# Patient Record
Sex: Male | Born: 1960 | Hispanic: Yes | Marital: Married | Smoking: Former smoker
Health system: Southern US, Community
[De-identification: ages and names within clinical notes are randomized; demographics above are authoritative.]

## PROBLEM LIST (undated history)

## (undated) DIAGNOSIS — I4891 Unspecified atrial fibrillation: Secondary | ICD-10-CM

## (undated) DIAGNOSIS — R7303 Prediabetes: Secondary | ICD-10-CM

## (undated) DIAGNOSIS — M109 Gout, unspecified: Secondary | ICD-10-CM

## (undated) DIAGNOSIS — K219 Gastro-esophageal reflux disease without esophagitis: Secondary | ICD-10-CM

## (undated) DIAGNOSIS — G4730 Sleep apnea, unspecified: Secondary | ICD-10-CM

## (undated) DIAGNOSIS — H269 Unspecified cataract: Secondary | ICD-10-CM

## (undated) HISTORY — DX: Unspecified cataract: H26.9

## (undated) HISTORY — DX: Prediabetes: R73.03

## (undated) HISTORY — DX: Gout, unspecified: M10.9

## (undated) HISTORY — PX: WISDOM TOOTH EXTRACTION: SHX21

---

## 1998-04-21 ENCOUNTER — Emergency Department (HOSPITAL_COMMUNITY): Admission: EM | Admit: 1998-04-21 | Discharge: 1998-04-21 | Payer: Self-pay | Admitting: Emergency Medicine

## 1998-04-21 ENCOUNTER — Encounter: Payer: Self-pay | Admitting: Emergency Medicine

## 1998-04-22 ENCOUNTER — Encounter: Admission: RE | Admit: 1998-04-22 | Discharge: 1998-04-22 | Payer: Self-pay | Admitting: Internal Medicine

## 2000-08-08 ENCOUNTER — Encounter: Admission: RE | Admit: 2000-08-08 | Discharge: 2000-08-08 | Payer: Self-pay | Admitting: Family Medicine

## 2012-05-09 ENCOUNTER — Encounter (HOSPITAL_COMMUNITY): Payer: Self-pay | Admitting: *Deleted

## 2012-05-09 ENCOUNTER — Emergency Department (HOSPITAL_COMMUNITY): Payer: Self-pay

## 2012-05-09 ENCOUNTER — Emergency Department (HOSPITAL_COMMUNITY)
Admission: EM | Admit: 2012-05-09 | Discharge: 2012-05-09 | Disposition: A | Discharge status: Home / Self Care | Payer: Self-pay | Attending: Emergency Medicine | Admitting: Emergency Medicine

## 2012-05-09 DIAGNOSIS — W2203XA Walked into furniture, initial encounter: Secondary | ICD-10-CM | POA: Insufficient documentation

## 2012-05-09 DIAGNOSIS — Y929 Unspecified place or not applicable: Secondary | ICD-10-CM | POA: Insufficient documentation

## 2012-05-09 DIAGNOSIS — Y939 Activity, unspecified: Secondary | ICD-10-CM | POA: Insufficient documentation

## 2012-05-09 DIAGNOSIS — S8012XA Contusion of left lower leg, initial encounter: Secondary | ICD-10-CM

## 2012-05-09 DIAGNOSIS — S8010XA Contusion of unspecified lower leg, initial encounter: Secondary | ICD-10-CM | POA: Insufficient documentation

## 2012-05-09 DIAGNOSIS — Z87891 Personal history of nicotine dependence: Secondary | ICD-10-CM | POA: Insufficient documentation

## 2012-05-09 MED ORDER — HYDROCODONE-ACETAMINOPHEN 5-325 MG PO TABS: 1.0000 | ORAL_TABLET | Freq: Four times a day (QID) | ORAL | Status: DC | PRN

## 2012-05-09 MED ORDER — IBUPROFEN 800 MG PO TABS: 800.0000 mg | ORAL_TABLET | Freq: Three times a day (TID) | ORAL | Status: DC | PRN

## 2012-05-09 NOTE — ED Notes (Signed)
Pt to ED c/o L foot pain after tripping on chair and hitting his L thigh.  However, it is his L foot that is hurting, swelling and redness noted to anterior portion of R foot.

## 2012-05-13 NOTE — ED Provider Notes (Signed)
History     CSN: [id number]  Arrival date & time 05/09/12  1101   First MD Initiated Contact with Patient 05/09/12 1156      Chief Complaint  Patient presents with  . Foot Pain    (Consider location/radiation/quality/duration/timing/severity/associated sxs/prior treatment) HPI Patient presents to the ER with L ankle and lower leg pain that started 2 days ago after tripping over a chair. The patient denies numbness or weakness in the injury site. The patient states that palpation makes his pain worse. The patient did not take anything for his pain prior to arrival.  History reviewed. No pertinent past medical history.  History reviewed. No pertinent past surgical history.  No family history on file.  History  Substance Use Topics  . Smoking status: Former Games developer  . Smokeless tobacco: Not on file  . Alcohol Use: Yes     Comment: occasionally      Review of Systems All other systems negative except as documented in the HPI. All pertinent positives and negatives as reviewed in the HPI.  Allergies  Review of patient's allergies indicates no known allergies.  Home Medications   Current Outpatient Rx  Name  Route  Sig  Dispense  Refill  . ibuprofen (ADVIL,MOTRIN) 200 MG tablet   Oral   Take 800 mg by mouth every 6 (six) hours as needed for pain.         Marland Kitchen HYDROcodone-acetaminophen (NORCO/VICODIN) 5-325 MG per tablet   Oral   Take 1 tablet by mouth every 6 (six) hours as needed for pain.   15 tablet   0   . ibuprofen (ADVIL,MOTRIN) 800 MG tablet   Oral   Take 1 tablet (800 mg total) by mouth every 8 (eight) hours as needed for pain.   21 tablet   0     BP 124/67  Temp(Src) 97.9 F (36.6 C) (Oral)  Resp 18  SpO2 100%  Physical Exam  Nursing note and vitals reviewed. Constitutional: He is oriented to person, place, and time. He appears well-developed and well-nourished. No distress.  Musculoskeletal:       Left lower leg: He exhibits tenderness. He  exhibits no swelling, no edema and no deformity.       Legs: Neurological: He is alert and oriented to person, place, and time.  Skin: Skin is warm and dry. No rash noted.    ED Course  Procedures (including critical care time)  Labs Reviewed - No data to display No results found.   1. Contusion of lower leg, left, initial encounter     The patient has contusion to L lower leg. The patient is advised to use ice and elevation to the Lower leg. Return here as needed.     MDM          Carlyle Dolly, PA-C 05/13/12 1535

## 2012-05-14 NOTE — ED Provider Notes (Signed)
Medical screening examination/treatment/procedure(s) were performed by non-physician practitioner and as supervising physician I was immediately available for consultation/collaboration.  Coy Rochford R. Skylee Baird, MD 05/14/12 0726 

## 2015-10-20 ENCOUNTER — Ambulatory Visit (INDEPENDENT_AMBULATORY_CARE_PROVIDER_SITE_OTHER): Payer: BC Managed Care – PPO | Admitting: Physician Assistant

## 2015-10-20 ENCOUNTER — Other Ambulatory Visit: Payer: Self-pay | Admitting: Physician Assistant

## 2015-10-20 VITALS — BP 120/80 | HR 90 | Temp 98.2°F | Resp 18 | Ht 67.0 in | Wt 213.0 lb

## 2015-10-20 DIAGNOSIS — Z13228 Encounter for screening for other metabolic disorders: Secondary | ICD-10-CM | POA: Diagnosis not present

## 2015-10-20 DIAGNOSIS — Z113 Encounter for screening for infections with a predominantly sexual mode of transmission: Secondary | ICD-10-CM | POA: Diagnosis not present

## 2015-10-20 DIAGNOSIS — Z1211 Encounter for screening for malignant neoplasm of colon: Secondary | ICD-10-CM

## 2015-10-20 DIAGNOSIS — Z13220 Encounter for screening for lipoid disorders: Secondary | ICD-10-CM

## 2015-10-20 DIAGNOSIS — Z131 Encounter for screening for diabetes mellitus: Secondary | ICD-10-CM

## 2015-10-20 DIAGNOSIS — Z114 Encounter for screening for human immunodeficiency virus [HIV]: Secondary | ICD-10-CM

## 2015-10-20 DIAGNOSIS — Z1329 Encounter for screening for other suspected endocrine disorder: Secondary | ICD-10-CM | POA: Diagnosis not present

## 2015-10-20 DIAGNOSIS — Z23 Encounter for immunization: Secondary | ICD-10-CM | POA: Diagnosis not present

## 2015-10-20 DIAGNOSIS — Z1159 Encounter for screening for other viral diseases: Secondary | ICD-10-CM

## 2015-10-20 DIAGNOSIS — Z0000 Encounter for general adult medical examination without abnormal findings: Secondary | ICD-10-CM

## 2015-10-20 DIAGNOSIS — Z130 Encounter for screening for diseases of the blood and blood-forming organs and certain disorders involving the immune mechanism: Secondary | ICD-10-CM | POA: Diagnosis not present

## 2015-10-20 NOTE — Patient Instructions (Signed)
     IF you received an x-ray today, you will receive an invoice from [facility]. Please contact [facility] at [phone] with questions or concerns regarding your invoice.   IF you received labwork today, you will receive an invoice from [facility]. Please contact [facility] at [phone] with questions or concerns regarding your invoice.   Our billing staff will not be able to assist you with questions regarding bills from these companies.  You will be contacted with the lab results as soon as they are available. The fastest way to get your results is to activate your My Chart account. Instructions are located on the last page of this paperwork. If you have not heard from us regarding the results in 2 weeks, please contact this office.      

## 2015-10-20 NOTE — Progress Notes (Signed)
10/20/2015 4:44 PM   DOB: [birth year] / MRN: [id number]  SUBJECTIVE:  Evan Page is a 55 y.o. male presenting for a annual physical.  He quit smoking 20 years ago.  He smoked for 25 years at one pack daily. He does drink alcohol on occasion.  He has a family history of heart disease in his mother but is not sure.  He mother is still living.  He has never had a colonoscopy and would like this today.  He does not want a flu shot and states he had the tetanus shot about 10 years ago.    Depression screen [facility] 2/9 10/20/2015  Decreased Interest 0  Down, Depressed, Hopeless 0  PHQ - 2 Score 0     He has No Known Allergies.   He  has a past medical history of Cataract.    He  reports that he has never smoked. He has never used smokeless tobacco. He reports that he drinks alcohol. He reports that he does not use drugs. He  has no sexual activity history on file. The patient  has no past surgical history on file.  His family history includes Diabetes in his father and mother; Drug abuse in his sister; Hyperlipidemia in his father, maternal grandmother, and mother.  Review of Systems  Constitutional: Negative for chills and fever.  Cardiovascular: Negative for chest pain.  Gastrointestinal: Negative for nausea.  Genitourinary: Negative for dysuria, frequency and urgency.  Skin: Negative for rash.  Neurological: Negative for dizziness and headaches.  Psychiatric/Behavioral: Negative for depression.    The problem list and medications were reviewed and updated by myself where necessary and exist elsewhere in the encounter.   OBJECTIVE:  BP 120/80 (BP Location: Right Arm, Patient Position: Sitting, Cuff Size: Small)   Pulse 90   Temp 98.2 F (36.8 C) (Oral)   Resp 18   Ht 5\' 7"  (1.702 m)   Wt 213 lb (96.6 kg)   SpO2 96%   BMI 33.36 kg/m   Physical Exam  Constitutional: He is oriented to person, place, and time.  Cardiovascular: Normal rate and regular rhythm.     Pulmonary/Chest: Effort normal and breath sounds normal.  Abdominal: Soft. Bowel sounds are normal. Hernia confirmed negative in the right inguinal area and confirmed negative in the left inguinal area.  Genitourinary: Testes normal. Right testis shows no mass and no tenderness. Left testis shows no mass and no tenderness. Uncircumcised. No discharge found.  Musculoskeletal: Normal range of motion.  Lymphadenopathy:       Right: No inguinal adenopathy present.       Left: No inguinal adenopathy present.  Neurological: He is alert and oriented to person, place, and time.  Skin: Skin is warm and dry.  Psychiatric: He has a normal mood and affect. His behavior is normal. Judgment and thought content normal.    No results found for this or any previous visit (from the past 72 hour(s)).  No results found.  Tribune was seen today for annual exam.  Diagnoses and all orders for this visit:  Physical exam: He is new to the health care system.  He drinks in moderation and quit smoking 20 years ago.  Will screen heavily.  Advised he return as needed for any problems.   -     Care order/instruction:  Special screening for malignant neoplasms, colon -     Ambulatory referral to Gastroenterology  Screening for metabolic disorder -  COMPLETE METABOLIC PANEL WITH GFR  Screening, lipid -     Lipid panel  Screening for thyroid disorder -     TSH  Screening for diabetes mellitus -     Hemoglobin A1c  Encounter for screening for HIV -     HIV antibody  Need for hepatitis C screening test -     Hepatitis C antibody  Need for Tdap vaccination -     Tdap vaccine greater than or equal to 7yo IM  Screening for deficiency anemia -     CBC  Routine screening for STI (sexually transmitted infection) -     GC/Chlamydia Probe Amp    The patient is advised to call or return to clinic if he does not see an improvement in symptoms, or to seek the care of the closest  emergency department if he worsens with the above plan.   Philis Fendt, MHS, PA-C [facility] 10/20/2015 4:44 PM

## 2015-10-21 LAB — CBC
HCT: 41.8 % (ref 38.5–50.0)
Hemoglobin: 14.3 g/dL (ref 13.2–17.1)
MCH: 34.4 pg — AB (ref 27.0–33.0)
MCHC: 34.2 g/dL (ref 32.0–36.0)
MCV: 100.5 fL — AB (ref 80.0–100.0)
MPV: 9.8 fL (ref 7.5–12.5)
PLATELETS: 298 10*3/uL (ref 140–400)
RBC: 4.16 MIL/uL — AB (ref 4.20–5.80)
RDW: 13.5 % (ref 11.0–15.0)
WBC: 6.8 10*3/uL (ref 3.8–10.8)

## 2015-10-21 LAB — LIPID PANEL
CHOLESTEROL: 146 mg/dL (ref 125–200)
HDL: 38 mg/dL — AB (ref >=40)
LDL CALC: 47 mg/dL (ref <130)
TRIGLYCERIDES: 306 mg/dL — AB (ref <150)
Total CHOL/HDL Ratio: 3.8 Ratio (ref <=5.0)
VLDL: 61 mg/dL — ABNORMAL HIGH (ref <30)

## 2015-10-21 LAB — COMPLETE METABOLIC PANEL WITH GFR
ALT: 41 U/L (ref 9–46)
AST: 31 U/L (ref 10–35)
Albumin: 4.3 g/dL (ref 3.6–5.1)
Alkaline Phosphatase: 42 U/L (ref 40–115)
BILIRUBIN TOTAL: 0.3 mg/dL (ref 0.2–1.2)
BUN: 23 mg/dL (ref 7–25)
CO2: 26 mmol/L (ref 20–31)
Calcium: 9.3 mg/dL (ref 8.6–10.3)
Chloride: 107 mmol/L (ref 98–110)
Creat: 1.52 mg/dL — ABNORMAL HIGH (ref 0.70–1.33)
GFR, EST AFRICAN AMERICAN: 58 mL/min — AB (ref >=60)
GFR, EST NON AFRICAN AMERICAN: 50 mL/min — AB (ref >=60)
Glucose, Bld: 82 mg/dL (ref 65–99)
Potassium: 4.5 mmol/L (ref 3.5–5.3)
Sodium: 141 mmol/L (ref 135–146)
TOTAL PROTEIN: 7.3 g/dL (ref 6.1–8.1)

## 2015-10-21 LAB — GC/CHLAMYDIA PROBE AMP
CT PROBE, AMP APTIMA: NOT DETECTED
GC PROBE AMP APTIMA: NOT DETECTED

## 2015-10-21 LAB — TSH: TSH: 1.92 mIU/L (ref 0.40–4.50)

## 2015-10-21 LAB — HIV ANTIBODY (ROUTINE TESTING W REFLEX): HIV: NONREACTIVE

## 2015-10-22 ENCOUNTER — Encounter: Payer: Self-pay | Admitting: Gastroenterology

## 2015-10-22 ENCOUNTER — Other Ambulatory Visit: Payer: Self-pay | Admitting: *Deleted

## 2015-10-22 DIAGNOSIS — D7589 Other specified diseases of blood and blood-forming organs: Secondary | ICD-10-CM

## 2015-10-22 DIAGNOSIS — R7989 Other specified abnormal findings of blood chemistry: Secondary | ICD-10-CM

## 2015-10-22 LAB — HEPATITIS C ANTIBODY: HCV AB: NEGATIVE

## 2015-10-22 LAB — HEMOGLOBIN A1C
Hgb A1c MFr Bld: 5.5 % (ref <5.7)
Mean Plasma Glucose: 111 mg/dL

## 2015-10-22 NOTE — Progress Notes (Signed)
Please add on labs B12 and folate. Diagnosis macrocytosis.  Please place future order for BMET in three weeks.  Diagnosis elevated serum creatinine.  Please give him a call and let him know that his labs look overall very good.  No diabetes and no need for cholesterol medication at this time. No evidence of HEP C, HIV, GC/chlamydia. We will need to investigate why his creatinine is high and are rechecking this.

## 2015-10-23 LAB — VITAMIN B12: VITAMIN B 12: 531 pg/mL (ref 200–1100)

## 2015-11-02 ENCOUNTER — Encounter (HOSPITAL_COMMUNITY): Payer: Self-pay | Admitting: *Deleted

## 2015-12-22 ENCOUNTER — Ambulatory Visit (AMBULATORY_SURGERY_CENTER): Payer: Self-pay

## 2015-12-22 VITALS — Ht 66.0 in | Wt 217.2 lb

## 2015-12-22 DIAGNOSIS — Z1211 Encounter for screening for malignant neoplasm of colon: Secondary | ICD-10-CM

## 2015-12-22 MED ORDER — SUPREP BOWEL PREP KIT 17.5-3.13-1.6 GM/177ML PO SOLN: 1.0000 | Freq: Once | ORAL | 0 refills | Status: AC

## 2015-12-22 NOTE — Progress Notes (Signed)
No allergies to eggs or soy No past problems with anesthesia No diet meds No home oxygen  1st colon avg risk screening

## 2015-12-23 ENCOUNTER — Encounter: Payer: Self-pay | Admitting: Gastroenterology

## 2016-01-05 ENCOUNTER — Ambulatory Visit (AMBULATORY_SURGERY_CENTER): Payer: BC Managed Care – PPO | Admitting: Gastroenterology

## 2016-01-05 ENCOUNTER — Encounter: Payer: Self-pay | Admitting: Gastroenterology

## 2016-01-05 VITALS — BP 123/71 | HR 54 | Temp 98.0°F | Resp 15 | Ht 66.0 in | Wt 217.0 lb

## 2016-01-05 DIAGNOSIS — D124 Benign neoplasm of descending colon: Secondary | ICD-10-CM | POA: Diagnosis not present

## 2016-01-05 DIAGNOSIS — D123 Benign neoplasm of transverse colon: Secondary | ICD-10-CM

## 2016-01-05 DIAGNOSIS — Z1212 Encounter for screening for malignant neoplasm of rectum: Secondary | ICD-10-CM | POA: Diagnosis not present

## 2016-01-05 DIAGNOSIS — Z1211 Encounter for screening for malignant neoplasm of colon: Secondary | ICD-10-CM

## 2016-01-05 MED ORDER — SODIUM CHLORIDE 0.9 % IV SOLN: 500.0000 mL | INTRAVENOUS | Status: DC

## 2016-01-05 NOTE — Progress Notes (Signed)
Reported to Dr. Loletha Carrow that pt ate 1 egg Sunday am at 6:00 am.  Evan Page

## 2016-01-05 NOTE — Progress Notes (Signed)
Called to room to assist during endoscopic procedure.  Patient ID and intended procedure confirmed with present staff. Received instructions for my participation in the procedure from the performing physician.  

## 2016-01-05 NOTE — Progress Notes (Signed)
Pt speaks spanish and some english.  He has interpreter, Ileana Ladd.  maw

## 2016-01-05 NOTE — Patient Instructions (Signed)
Impression/Recommendations:  Polyp handout given to patient. Diverticulosis handout given to patient.  Repeat colonoscopy for surveillance.  Date to be determined after pathology results reviewed.  YOU HAD AN ENDOSCOPIC PROCEDURE TODAY AT [facility]:   Refer to the procedure report that was given to you for any specific questions about what was found during the examination.  If the procedure report does not answer your questions, please call your gastroenterologist to clarify.  If you requested that your care partner not be given the details of your procedure findings, then the procedure report has been included in a sealed envelope for you to review at your convenience later.  YOU SHOULD EXPECT: Some feelings of bloating in the abdomen. Passage of more gas than usual.  Walking can help get rid of the air that was put into your GI tract during the procedure and reduce the bloating. If you had a lower endoscopy (such as a colonoscopy or flexible sigmoidoscopy) you may notice spotting of blood in your stool or on the toilet paper. If you underwent a bowel prep for your procedure, you may not have a normal bowel movement for a few days.  Please Note:  You might notice some irritation and congestion in your nose or some drainage.  This is from the oxygen used during your procedure.  There is no need for concern and it should clear up in a day or so.  SYMPTOMS TO REPORT IMMEDIATELY:   Following lower endoscopy (colonoscopy or flexible sigmoidoscopy):  Excessive amounts of blood in the stool  Significant tenderness or worsening of abdominal pains  Swelling of the abdomen that is new, acute  Fever of 100F or higher For urgent or emergent issues, a gastroenterologist can be reached at any hour by calling [phone].   DIET:  We do recommend a small meal at first, but then you may proceed to your regular diet.  Drink plenty of fluids but you should avoid alcoholic beverages for  24 hours.  ACTIVITY:  You should plan to take it easy for the rest of today and you should NOT DRIVE or use heavy machinery until tomorrow (because of the sedation medicines used during the test).    FOLLOW UP: Our staff will call the number listed on your records the next business day following your procedure to check on you and address any questions or concerns that you may have regarding the information given to you following your procedure. If we do not reach you, we will leave a message.  However, if you are feeling well and you are not experiencing any problems, there is no need to return our call.  We will assume that you have returned to your regular daily activities without incident.  If any biopsies were taken you will be contacted by phone or by letter within the next 1-3 weeks.  Please call us at [phone] if you have not heard about the biopsies in 3 weeks.    SIGNATURES/CONFIDENTIALITY: You and/or your care partner have signed paperwork which will be entered into your electronic medical record.  These signatures attest to the fact that that the information above on your After Visit Summary has been reviewed and is understood.  Full responsibility of the confidentiality of this discharge information lies with you and/or your care-partner.

## 2016-01-05 NOTE — Op Note (Signed)
[facility] Patient Name: Evan Page Procedure Date: 01/05/2016 11:08 AM MRN: [id number] Endoscopist: Toyah. Loletha Carrow , MD Age: 55 Referring MD:  Date of Birth: [birth year] Gender: Male Account #: [id number] Procedure:                Colonoscopy Indications:              Screening for colorectal malignant neoplasm, This                            is the patient's first colonoscopy Medicines:                Monitored Anesthesia Care Procedure:                Pre-Anesthesia Assessment:                           - Prior to the procedure, a History and Physical                            was performed, and patient medications and                            allergies were reviewed. The patient's tolerance of                            previous anesthesia was also reviewed. The risks                            and benefits of the procedure and the sedation                            options and risks were discussed with the patient.                            All questions were answered, and informed consent                            was obtained. Prior Anticoagulants: The patient has                            taken no previous anticoagulant or antiplatelet                            agents. ASA Grade Assessment: I - A normal, healthy                            patient. After reviewing the risks and benefits,                            the patient was deemed in satisfactory condition to                            undergo the procedure.  After obtaining informed consent, the colonoscope                            was passed under direct vision. Throughout the                            procedure, the patient's blood pressure, pulse, and                            oxygen saturations were monitored continuously. The                            Model CF-HQ190L [id number]) scope was introduced                            through the anus and advanced to the the  cecum,                            identified by appendiceal orifice and ileocecal                            valve. The colonoscopy was performed without                            difficulty. The patient tolerated the procedure                            well. The quality of the bowel preparation was                            excellent. The ileocecal valve, appendiceal                            orifice, and rectum were photographed. The quality                            of the bowel preparation was evaluated using the                            BBPS [facility] Bowel Preparation Scale) with scores                            of: Right Colon = 3, Transverse Colon = 3 and Left                            Colon = 3 (entire mucosa seen well with no residual                            staining, small fragments of stool or opaque                            liquid). The total BBPS score equals 9. The bowel  preparation used was SUPREP. Scope In: 11:17:49 AM Scope Out: 11:34:15 AM Scope Withdrawal Time: 0 hours 14 minutes 11 seconds  Total Procedure Duration: 0 hours 16 minutes 26 seconds  Findings:                 The perianal and digital rectal examinations were                            normal.                           Two sessile polyps were found in the proximal                            descending colon and hepatic flexure. The polyps                            were 2 mm in size. These polyps were removed with a                            piecemeal technique using a cold biopsy forceps.                            Resection and retrieval were complete.                           Multiple medium-mouthed diverticula were found in                            the left colon.                           The exam was otherwise without abnormality on                            direct and retroflexion views. Complications:            No immediate complications. Estimated  Blood Loss:     Estimated blood loss: none. Impression:               - Two 2 mm polyps in the proximal descending colon                            and at the hepatic flexure, removed piecemeal using                            a cold biopsy forceps. Resected and retrieved.                           - Diverticulosis in the left colon.                           - The examination was otherwise normal on direct                            and retroflexion views. Recommendation:           -  Patient has a contact number available for                            emergencies. The signs and symptoms of potential                            delayed complications were discussed with the                            patient. Return to normal activities tomorrow.                            Written discharge instructions were provided to the                            patient.                           - Resume previous diet.                           - Continue present medications.                           - Await pathology results.                           - Repeat colonoscopy is recommended for                            surveillance. The colonoscopy date will be                            determined after pathology results from today's                            exam become available for review. Henry L. Loletha Carrow, MD 01/05/2016 11:37:14 AM This report has been signed electronically.

## 2016-01-06 ENCOUNTER — Telehealth: Payer: Self-pay

## 2016-01-06 NOTE — Telephone Encounter (Signed)
  Follow up Call-  Call back number 01/05/2016  Post procedure Call Back phone  # [phone] cell  Permission to leave phone message Yes  Some recent data might be hidden     Patient questions:  Do you have a fever, pain , or abdominal swelling? No. Pain Score  0 *  Have you tolerated food without any problems? Yes.    Have you been able to return to your normal activities? Yes.    Do you have any questions about your discharge instructions: Diet   No. Medications  No. Follow up visit  No.  Do you have questions or concerns about your Care? No.  Actions: * If pain score is 4 or above: No action needed, pain <4.

## 2016-01-08 ENCOUNTER — Encounter: Payer: Self-pay | Admitting: Gastroenterology

## 2016-03-01 ENCOUNTER — Ambulatory Visit (INDEPENDENT_AMBULATORY_CARE_PROVIDER_SITE_OTHER): Payer: BC Managed Care – PPO | Admitting: Family Medicine

## 2016-03-01 VITALS — BP 116/62 | HR 69 | Temp 98.2°F | Resp 16 | Ht 66.0 in | Wt 209.0 lb

## 2016-03-01 DIAGNOSIS — D7589 Other specified diseases of blood and blood-forming organs: Secondary | ICD-10-CM

## 2016-03-01 DIAGNOSIS — R7989 Other specified abnormal findings of blood chemistry: Secondary | ICD-10-CM | POA: Diagnosis not present

## 2016-03-01 DIAGNOSIS — N529 Male erectile dysfunction, unspecified: Secondary | ICD-10-CM

## 2016-03-01 DIAGNOSIS — R0683 Snoring: Secondary | ICD-10-CM | POA: Diagnosis not present

## 2016-03-01 DIAGNOSIS — R400 Somnolence: Secondary | ICD-10-CM | POA: Diagnosis not present

## 2016-03-01 DIAGNOSIS — R5383 Other fatigue: Secondary | ICD-10-CM | POA: Diagnosis not present

## 2016-03-01 NOTE — Progress Notes (Signed)
Subjective:  By signing my name below, I, Raven Small, attest that this documentation has been prepared under the direction and in the presence of Merri Ray, MD.  Electronically Signed: Thea Alken, ED Scribe. 03/01/2016. 5:34 PM.   Patient ID: Evan Page, male    DOB: [birth year], 56 y.o.   MRN: [id number]  HPI Chief Complaint  Patient presents with  . Follow-up    HPI Comments: Evan Page is a 56 y.o. male who presents to the Primary Care at [facility] for follow up. He was seen 10/2015 by Preston Fleeting for a physical. He was advised to return for lab only visit for elevated creatinine and repeat blood test, including B12 and folate due to macrocytosis on CBC. HBG was normal.   Pt drinks less than a bottle a wine a week. Pt believes he doesn't drink enough water.   Fatigue Pt also notes feeling fatigue and day time somnolence. Pt snores every night and sometimes stops breathing during the night. Pt lives with his girl friend who's also told him that he snores.   Difficulty obtain erection. Pt reports trouble obtaining an erection. He has been with his girlfriend for 7 months. He has tried viagra in the past but reports HA with medication.   There are no active problems to display for this patient.  Past Medical History:  Diagnosis Date  . Cataract    left    Past Surgical History:  Procedure Laterality Date  . wisdomteeth extraction     3 removed - 1 remains   No Known Allergies Prior to Admission medications   Medication Sig Start Date End Date Taking? Authorizing Provider  ibuprofen (ADVIL,MOTRIN) 200 MG tablet Take 800 mg by mouth every 6 (six) hours as needed for pain.    Historical Provider, MD  ibuprofen (ADVIL,MOTRIN) 800 MG tablet Take 1 tablet (800 mg total) by mouth every 8 (eight) hours as needed for pain. Patient not taking: Reported on 03/01/2016 05/09/12   Dalia Heading, PA-C   Social History   Social History  . Marital status: Divorced      Spouse name: N/A  . Number of children: N/A  . Years of education: N/A   Occupational History  . Not on file.   Social History Main Topics  . Smoking status: Former Research scientist (life sciences)  . Smokeless tobacco: Never Used  . Alcohol use 1.2 - 1.8 oz/week    2 - 3 Glasses of wine per week     Comment: occasionally  . Drug use: No  . Sexual activity: Not on file   Other Topics Concern  . Not on file   Social History Narrative   ** Merged History Encounter **       Review of Systems  Constitutional: Positive for fatigue. Negative for chills and fever.    Objective:   Physical Exam  Constitutional: He is oriented to person, place, and time. He appears well-developed and well-nourished. No distress.  HENT:  Head: Normocephalic and atraumatic.  Eyes: Conjunctivae and EOM are normal.  Neck: Neck supple.  Cardiovascular: Normal rate.   Pulmonary/Chest: Effort normal.  Musculoskeletal: Normal range of motion.  Neurological: He is alert and oriented to person, place, and time.  Skin: Skin is warm and dry.  Psychiatric: He has a normal mood and affect. His behavior is normal.  Nursing note and vitals reviewed.   Vitals:   03/01/16 1655  BP: 116/62  Pulse: 69  Resp: 16  Temp: 98.2 F (  36.8 C)  TempSrc: Oral  SpO2: 98%  Weight: 209 lb (94.8 kg)  Height: 5\' 6"  (1.676 m)     Assessment & Plan:   [facility] Evan Page is a 56 y.o. male Macrocytosis - Plan: Vitamin B12, Folate, CBC with Differential/Platelet  - repeat CBC, check B12, folate  Elevated serum creatinine - Plan: Basic metabolic panel  - increase fluids/hydration. Repeat BMP.   Erectile dysfunction, unspecified erectile dysfunction type - Plan: Testosterone  - intolerant to prior trial of Viagra by report. Check testosterone, then rtc to discuss further.   Fatigue, unspecified type - Plan: Ambulatory referral to Sleep Studies Snoring - Plan: Ambulatory referral to Sleep Studies Daytime somnolence - Plan: Ambulatory  referral to Sleep Studies  -refer to sleep studies for likely sleep study to r/o OSA.   No orders of the defined types were placed in this encounter.  Patient Instructions   I will refer you to a sleep specialist to test for sleep apnea as possible cause of fatigue and sleepiness, and snoring.  Return within the next 1 week between 8 and 10 in the morning to have blood work drawn. I will recheck  blood counts, kidney function tests, and testosterone level. (Testosterone level needs to be drawn between 8 and 10 in the morning). If that is normal, can return to discuss other treatments of erectile dysfunction.   Fatigue Introduction Fatigue is feeling tired all of the time, a lack of energy, or a lack of motivation. Occasional or mild fatigue is often a normal response to activity or life in general. However, long-lasting (chronic) or extreme fatigue may indicate an underlying medical condition. Follow these instructions at home: Watch your fatigue for any changes. The following actions may help to lessen any discomfort you are feeling:  Talk to your health care provider about how much sleep you need each night. Try to get the required amount every night.  Take medicines only as directed by your health care provider.  Eat a healthy and nutritious diet. Ask your health care provider if you need help changing your diet.  Drink enough fluid to keep your urine clear or pale yellow.  Practice ways of relaxing, such as yoga, meditation, massage therapy, or acupuncture.  Exercise regularly.  Change situations that cause you stress. Try to keep your work and personal routine reasonable.  Do not abuse illegal drugs.  Limit alcohol intake to no more than 1 drink per day for nonpregnant women and 2 drinks per day for men. One drink equals 12 ounces of beer, 5 ounces of wine, or 1 ounces of hard liquor.  Take a multivitamin, if directed by your health care provider. Contact a health care  provider if:  Your fatigue does not get better.  You have a fever.  You have unintentional weight loss or gain.  You have headaches.  You have difficulty:  Falling asleep.  Sleeping throughout the night.  You feel angry, guilty, anxious, or sad.  You are unable to have a bowel movement (constipation).  You skin is dry.  Your legs or another part of your body is swollen. Get help right away if:  You feel confused.  Your vision is blurry.  You feel faint or pass out.  You have a severe headache.  You have severe abdominal, pelvic, or back pain.  You have chest pain, shortness of breath, or an irregular or fast heartbeat.  You are unable to urinate or you urinate less than normal.  You  develop abnormal bleeding, such as bleeding from the rectum, vagina, nose, lungs, or nipples.  You vomit blood.  You have thoughts about harming yourself or committing suicide.  You are worried that you might harm someone else. This information is not intended to replace advice given to you by your health care provider. Make sure you discuss any questions you have with your health care provider. Document Released: 11/15/2006 Document Revised: 06/26/2015 Document Reviewed: 05/22/2013  2017 Elsevier    IF you received an x-ray today, you will receive an invoice from [facility]. Please contact [facility] at [phone] with questions or concerns regarding your invoice.   IF you received labwork today, you will receive an invoice from [place]. Please contact [facility] at [phone] with questions or concerns regarding your invoice.   Our billing staff will not be able to assist you with questions regarding bills from these companies.  You will be contacted with the lab results as soon as they are available. The fastest way to get your results is to activate your My Chart account. Instructions are located on the last page of this paperwork. If you have not heard  from [place] regarding the results in 2 weeks, please contact this office.       I personally performed the services described in this documentation, which was scribed in my presence. The recorded information has been reviewed and considered for accuracy and completeness, addended by me as needed, and agree with information above.  Signed,   Merri Ray, MD [facility] at [place].  03/01/16 9:54 PM

## 2016-03-01 NOTE — Patient Instructions (Addendum)
I will refer you to a sleep specialist to test for sleep apnea as possible cause of fatigue and sleepiness, and snoring.  Return within the next 1 week between 8 and 10 in the morning to have blood work drawn. I will recheck  blood counts, kidney function tests, and testosterone level. (Testosterone level needs to be drawn between 8 and 10 in the morning). If that is normal, can return to discuss other treatments of erectile dysfunction.   Fatigue Introduction Fatigue is feeling tired all of the time, a lack of energy, or a lack of motivation. Occasional or mild fatigue is often a normal response to activity or life in general. However, long-lasting (chronic) or extreme fatigue may indicate an underlying medical condition. Follow these instructions at home: Watch your fatigue for any changes. The following actions may help to lessen any discomfort you are feeling:  Talk to your health care provider about how much sleep you need each night. Try to get the required amount every night.  Take medicines only as directed by your health care provider.  Eat a healthy and nutritious diet. Ask your health care provider if you need help changing your diet.  Drink enough fluid to keep your urine clear or pale yellow.  Practice ways of relaxing, such as yoga, meditation, massage therapy, or acupuncture.  Exercise regularly.  Change situations that cause you stress. Try to keep your work and personal routine reasonable.  Do not abuse illegal drugs.  Limit alcohol intake to no more than 1 drink per day for nonpregnant women and 2 drinks per day for men. One drink equals 12 ounces of beer, 5 ounces of wine, or 1 ounces of hard liquor.  Take a multivitamin, if directed by your health care provider. Contact a health care provider if:  Your fatigue does not get better.  You have a fever.  You have unintentional weight loss or gain.  You have headaches.  You have difficulty:  Falling  asleep.  Sleeping throughout the night.  You feel angry, guilty, anxious, or sad.  You are unable to have a bowel movement (constipation).  You skin is dry.  Your legs or another part of your body is swollen. Get help right away if:  You feel confused.  Your vision is blurry.  You feel faint or pass out.  You have a severe headache.  You have severe abdominal, pelvic, or back pain.  You have chest pain, shortness of breath, or an irregular or fast heartbeat.  You are unable to urinate or you urinate less than normal.  You develop abnormal bleeding, such as bleeding from the rectum, vagina, nose, lungs, or nipples.  You vomit blood.  You have thoughts about harming yourself or committing suicide.  You are worried that you might harm someone else. This information is not intended to replace advice given to you by your health care provider. Make sure you discuss any questions you have with your health care provider. Document Released: 11/15/2006 Document Revised: 06/26/2015 Document Reviewed: 05/22/2013  2017 Elsevier    IF you received an x-ray today, you will receive an invoice from [facility]. Please contact [facility] at [phone] with questions or concerns regarding your invoice.   IF you received labwork today, you will receive an invoice from [place]. Please contact [facility] at [phone] with questions or concerns regarding your invoice.   Our billing staff will not be able to assist you with questions regarding bills from these companies.  You  will be contacted with the lab results as soon as they are available. The fastest way to get your results is to activate your My Chart account. Instructions are located on the last page of this paperwork. If you have not heard from [place] regarding the results in 2 weeks, please contact this office.

## 2016-03-02 ENCOUNTER — Other Ambulatory Visit: Payer: BC Managed Care – PPO

## 2016-03-02 DIAGNOSIS — D7589 Other specified diseases of blood and blood-forming organs: Secondary | ICD-10-CM

## 2016-03-02 DIAGNOSIS — R7989 Other specified abnormal findings of blood chemistry: Secondary | ICD-10-CM

## 2016-03-02 DIAGNOSIS — N529 Male erectile dysfunction, unspecified: Secondary | ICD-10-CM

## 2016-03-03 LAB — BASIC METABOLIC PANEL
BUN/Creatinine Ratio: 16 (ref 9–20)
BUN: 17 mg/dL (ref 6–24)
CO2: 20 mmol/L (ref 18–29)
CREATININE: 1.04 mg/dL (ref 0.76–1.27)
Calcium: 9.3 mg/dL (ref 8.7–10.2)
Chloride: 104 mmol/L (ref 96–106)
GFR calc Af Amer: 92 mL/min/{1.73_m2} (ref >59)
GFR, EST NON AFRICAN AMERICAN: 80 mL/min/{1.73_m2} (ref >59)
GLUCOSE: 96 mg/dL (ref 65–99)
Potassium: 4.9 mmol/L (ref 3.5–5.2)
Sodium: 143 mmol/L (ref 134–144)

## 2016-03-03 LAB — CBC WITH DIFFERENTIAL/PLATELET
Basophils Absolute: 0.1 10*3/uL (ref 0.0–0.2)
Basos: 1 %
EOS (ABSOLUTE): 0 10*3/uL (ref 0.0–0.4)
EOS: 1 %
HEMATOCRIT: 47.3 % (ref 37.5–51.0)
HEMOGLOBIN: 16.3 g/dL (ref 13.0–17.7)
IMMATURE GRANS (ABS): 0 10*3/uL (ref 0.0–0.1)
IMMATURE GRANULOCYTES: 0 %
LYMPHS ABS: 2.4 10*3/uL (ref 0.7–3.1)
Lymphs: 43 %
MCH: 34.2 pg — ABNORMAL HIGH (ref 26.6–33.0)
MCHC: 34.5 g/dL (ref 31.5–35.7)
MCV: 99 fL — ABNORMAL HIGH (ref 79–97)
Monocytes Absolute: 0.4 10*3/uL (ref 0.1–0.9)
Monocytes: 7 %
NEUTROS PCT: 48 %
Neutrophils Absolute: 2.6 10*3/uL (ref 1.4–7.0)
Platelets: 269 10*3/uL (ref 150–379)
RBC: 4.76 x10E6/uL (ref 4.14–5.80)
RDW: 13.5 % (ref 12.3–15.4)
WBC: 5.4 10*3/uL (ref 3.4–10.8)

## 2016-03-03 LAB — FOLATE: Folate: 12.4 ng/mL (ref >3.0)

## 2016-03-03 LAB — VITAMIN B12: Vitamin B-12: 463 pg/mL (ref 232–1245)

## 2016-03-03 LAB — TESTOSTERONE: Testosterone: 596 ng/dL (ref 264–916)

## 2016-03-16 ENCOUNTER — Telehealth: Payer: Self-pay | Admitting: Family Medicine

## 2016-03-16 NOTE — Telephone Encounter (Signed)
Pt called about labs I gave him message and also let him know that a copy was sent in the mail

## 2016-04-07 ENCOUNTER — Institutional Professional Consult (permissible substitution): Payer: BC Managed Care – PPO | Admitting: Neurology

## 2016-10-05 ENCOUNTER — Encounter: Payer: Self-pay | Admitting: Physician Assistant

## 2016-10-05 ENCOUNTER — Ambulatory Visit (INDEPENDENT_AMBULATORY_CARE_PROVIDER_SITE_OTHER): Payer: BC Managed Care – PPO | Admitting: Physician Assistant

## 2016-10-05 VITALS — BP 129/74 | HR 61 | Temp 97.7°F | Resp 16 | Ht 68.19 in | Wt 217.6 lb

## 2016-10-05 DIAGNOSIS — J069 Acute upper respiratory infection, unspecified: Secondary | ICD-10-CM | POA: Diagnosis not present

## 2016-10-05 DIAGNOSIS — H11152 Pinguecula, left eye: Secondary | ICD-10-CM | POA: Diagnosis not present

## 2016-10-05 MED ORDER — CETIRIZINE-PSEUDOEPHEDRINE ER 5-120 MG PO TB12: 1.0000 | ORAL_TABLET | Freq: Two times a day (BID) | ORAL | 0 refills | Status: AC

## 2016-10-05 NOTE — Progress Notes (Signed)
    10/05/2016 11:10 AM   DOB: [birth year] / MRN: [id number]  SUBJECTIVE:  Evan Page is a 56 y.o. male presenting for nasal congestion.  Has tried nothing so far.  Feels that he is getting worse.   Left eye "scarring." Has not seen optho.   He has No Known Allergies.   He  has a past medical history of Cataract.    He  reports that he has quit smoking. He has never used smokeless tobacco. He reports that he drinks about 1.2 - 1.8 oz of alcohol per week . He reports that he does not use drugs. He  has no sexual activity history on file. The patient  has a past surgical history that includes wisdomteeth extraction.  His family history includes Diabetes in his father and mother; Drug abuse in his sister; Esophageal cancer in his father; Hyperlipidemia in his father, maternal grandmother, and mother.  Review of Systems  Constitutional: Positive for chills and malaise/fatigue. Negative for fever.  HENT: Positive for congestion, nosebleeds and sore throat.   Eyes: Positive for blurred vision.  Respiratory: Positive for cough.   Cardiovascular: Negative for chest pain.  Gastrointestinal: Negative for nausea.    The problem list and medications were reviewed and updated by myself where necessary and exist elsewhere in the encounter.   OBJECTIVE:  BP 129/74 (BP Location: Right Arm, Patient Position: Sitting, Cuff Size: Large)   Pulse 61   Temp 97.7 F (36.5 C) (Oral)   Resp 16   Ht 5' 8.19" (1.732 m)   Wt 217 lb 9.6 oz (98.7 kg)   SpO2 97%   BMI 32.90 kg/m   Physical Exam  Constitutional: He is active and cooperative.  Eyes:    Cardiovascular: Normal rate, regular rhythm and normal heart sounds.   Pulmonary/Chest: Effort normal. No tachypnea.  Neurological: He is alert.  Skin: Skin is warm.  Vitals reviewed.   No results found for this or any previous visit (from the past 72 hour(s)).  No results found.  ASSESSMENT AND PLAN:  Evan Page was seen today for sinus  problem and eye pain.  Diagnoses and all orders for this visit:  Viral URI -     cetirizine-pseudoephedrine (ZYRTEC-D) 5-120 MG tablet; Take 1 tablet by mouth 2 (two) times daily.  Pinguecula of left eye -     Ambulatory referral to Ophthalmology    The patient is advised to call or return to clinic if he does not see an improvement in symptoms, or to seek the care of the closest emergency department if he worsens with the above plan.   Philis Fendt, MHS, PA-C [facility] at [facility] 10/05/2016 11:10 AM

## 2016-10-11 ENCOUNTER — Encounter: Payer: Self-pay | Admitting: Physician Assistant

## 2016-10-11 ENCOUNTER — Ambulatory Visit (INDEPENDENT_AMBULATORY_CARE_PROVIDER_SITE_OTHER): Payer: BC Managed Care – PPO | Admitting: Physician Assistant

## 2016-10-11 VITALS — BP 127/76 | HR 69 | Temp 98.4°F | Resp 16 | Ht 68.19 in | Wt 220.0 lb

## 2016-10-11 DIAGNOSIS — J069 Acute upper respiratory infection, unspecified: Secondary | ICD-10-CM

## 2016-10-11 DIAGNOSIS — R5383 Other fatigue: Secondary | ICD-10-CM

## 2016-10-11 MED ORDER — HYDROCODONE-HOMATROPINE 5-1.5 MG/5ML PO SYRP: 5.0000 mL | ORAL_SOLUTION | Freq: Three times a day (TID) | ORAL | 0 refills | Status: DC | PRN

## 2016-10-11 MED ORDER — AZITHROMYCIN 250 MG PO TABS: ORAL_TABLET | ORAL | 0 refills | Status: AC

## 2016-10-11 NOTE — Progress Notes (Signed)
    10/11/2016 4:18 PM   DOB: October 22, 1960 / MRN: [id number]  SUBJECTIVE:  Evan Page Evan Page is a 56 y.o. male presenting for recheck acute URI.  Has tried zyrtec-D at my request and continues to worsen.  Complains of persistent nasal congestion and now new facial pain. No fever, HA, nausea, vision changes, teeth pain, appetite changes.  Tells me that he has been working 80 hours a week for about 4 years and is tired.  He is requesting a note advising him to quit 1 job.     He has No Known Allergies.   He  has a past medical history of Cataract.    He  reports that he has quit smoking. He has never used smokeless tobacco. He reports that he drinks about 1.2 - 1.8 oz of alcohol per week . He reports that he does not use drugs. He  has no sexual activity history on file. The patient  has a past surgical history that includes wisdomteeth extraction.  His family history includes Diabetes in his father and mother; Drug abuse in his sister; Esophageal cancer in his father; Hyperlipidemia in his father, maternal grandmother, and mother.  Review of Systems  Constitutional: Negative for chills and fever.  HENT: Positive for congestion. Negative for ear pain and sore throat.   Respiratory: Positive for cough. Negative for wheezing.   Gastrointestinal: Negative for nausea.  Skin: Negative for itching and rash.  Neurological: Negative for dizziness.    The problem list and medications were reviewed and updated by myself where necessary and exist elsewhere in the encounter.   OBJECTIVE:  BP 127/76 (BP Location: Right Arm, Patient Position: Sitting, Cuff Size: Large)   Pulse 69   Temp 98.4 F (36.9 C) (Oral)   Resp 16   Ht 5' 8.19" (1.732 m)   Wt 220 lb (99.8 kg)   SpO2 96%   BMI 33.26 kg/m   Physical Exam  Constitutional: He appears well-developed. He is active and cooperative.  Non-toxic appearance.  Cardiovascular: Normal rate, regular rhythm, S1 normal, S2 normal, normal heart sounds,  intact distal pulses and normal pulses.  Exam reveals no gallop and no friction rub.   No murmur heard. Pulmonary/Chest: Effort normal. No stridor. No tachypnea. No respiratory distress. He has no wheezes. He has no rales.  Abdominal: He exhibits no distension.  Musculoskeletal: He exhibits no edema.  Neurological: He is alert.  Skin: Skin is warm and dry. He is not diaphoretic. No pallor.  Vitals reviewed.   No results found for this or any previous visit (from the past 72 hour(s)).  No results found.  ASSESSMENT AND PLAN:  Evan Page was seen today for cough.  Diagnoses and all orders for this visit:  Acute URI: Will cover for ABRS.   -     azithromycin (ZITHROMAX) 250 MG tablet; Take 2 tabs PO x 1 dose, then 1 tab PO QD x 4 days -     HYDROcodone-homatropine (HYCODAN) 5-1.5 MG/5ML syrup; Take 5 mLs by mouth every 8 (eight) hours as needed for cough.  Fatigue, unspecified type Comments: Occupational.    The patient is advised to call or return to clinic if he does not see an improvement in symptoms, or to seek the care of the closest emergency department if he worsens with the above plan.   Philis Fendt, MHS, PA-C [facility] at [facility] 10/11/2016 4:18 PM

## 2016-10-11 NOTE — Patient Instructions (Signed)
Cmoe back in ten days if you are still having symptoms.   Come back in about 3 months for a physical.

## 2017-02-01 DIAGNOSIS — I4892 Unspecified atrial flutter: Secondary | ICD-10-CM

## 2017-02-01 HISTORY — DX: Unspecified atrial flutter: I48.92

## 2017-02-11 ENCOUNTER — Ambulatory Visit (INDEPENDENT_AMBULATORY_CARE_PROVIDER_SITE_OTHER): Payer: BC Managed Care – PPO

## 2017-02-11 ENCOUNTER — Ambulatory Visit: Payer: BC Managed Care – PPO | Admitting: Physician Assistant

## 2017-02-11 ENCOUNTER — Encounter: Payer: Self-pay | Admitting: Physician Assistant

## 2017-02-11 ENCOUNTER — Other Ambulatory Visit: Payer: Self-pay

## 2017-02-11 VITALS — BP 138/84 | HR 96 | Resp 16 | Ht 68.19 in | Wt 220.6 lb

## 2017-02-11 DIAGNOSIS — I499 Cardiac arrhythmia, unspecified: Secondary | ICD-10-CM | POA: Diagnosis not present

## 2017-02-11 DIAGNOSIS — R42 Dizziness and giddiness: Secondary | ICD-10-CM | POA: Diagnosis not present

## 2017-02-11 LAB — POCT CBC
Granulocyte percent: 56.6 %G (ref 37–80)
HEMATOCRIT: 43.9 % (ref 43.5–53.7)
HEMOGLOBIN: 14.8 g/dL (ref 14.1–18.1)
Lymph, poc: 2.8 (ref 0.6–3.4)
MCH: 34.3 pg — AB (ref 27–31.2)
MCHC: 33.8 g/dL (ref 31.8–35.4)
MCV: 101.6 fL — AB (ref 80–97)
MID (cbc): 0.4 (ref 0–0.9)
MPV: 7.1 fL (ref 0–99.8)
POC GRANULOCYTE: 4.1 (ref 2–6.9)
POC LYMPH PERCENT: 38.2 %L (ref 10–50)
POC MID %: 5.2 % (ref 0–12)
Platelet Count, POC: 276 10*3/uL (ref 142–424)
RBC: 4.32 M/uL — AB (ref 4.69–6.13)
RDW, POC: 13.2 %
WBC: 7.3 10*3/uL (ref 4.6–10.2)

## 2017-02-11 LAB — POCT URINALYSIS DIP (MANUAL ENTRY)
BILIRUBIN UA: NEGATIVE
BILIRUBIN UA: NEGATIVE mg/dL
Glucose, UA: NEGATIVE mg/dL
Leukocytes, UA: NEGATIVE
Nitrite, UA: NEGATIVE
PH UA: 5.5 (ref 5.0–8.0)
Protein Ur, POC: NEGATIVE mg/dL
SPEC GRAV UA: 1.015 (ref 1.010–1.025)
Urobilinogen, UA: 0.2 E.U./dL

## 2017-02-11 LAB — GLUCOSE, POCT (MANUAL RESULT ENTRY): POC Glucose: 87 mg/dl (ref 70–99)

## 2017-02-11 NOTE — Progress Notes (Signed)
02/11/2017 11:04 AM   DOB: 1960/10/31 / MRN: [id number]  SUBJECTIVE:  Evan Page is a 57 y.o. male presenting for dizziness.  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  Describes the dizziness as a drunken feeling but this does not get worse with head movement.  It does occur when he gets up to walk however.  It does get better with rest.  He again denies SOB.   He has No Known Allergies.   He  has a past medical history of Cataract.    He  reports that he has quit smoking. he has never used smokeless tobacco. He reports that he drinks about 1.2 - 1.8 oz of alcohol per week. He reports that he does not use drugs. He  has no sexual activity history on file. The patient  has a past surgical history that includes wisdomteeth extraction.  His family history includes Diabetes in his father and mother; Drug abuse in his sister; Esophageal cancer in his father; Hyperlipidemia in his father, maternal grandmother, and mother.  Review of Systems  Constitutional: Negative for chills, diaphoresis and fever.  Eyes: Negative.   Respiratory: Negative for shortness of breath.   Cardiovascular: Negative for chest pain, orthopnea and leg swelling.  Gastrointestinal: Negative for abdominal pain, blood in stool, constipation, diarrhea, heartburn, melena, nausea and vomiting.  Genitourinary: Negative for flank pain.  Skin: Negative for rash.  Neurological: Negative for dizziness, sensory change, speech change, focal weakness and headaches.    The problem list and medications were reviewed and updated by myself where necessary and exist elsewhere in the encounter.   OBJECTIVE:  BP 138/84 (BP Location: Left Arm, Patient Position: Sitting, Cuff Size: Large)   Pulse 96   Resp 16   Ht 5' 8.19" (1.732 m)   Wt 220 lb 9.6 oz (100.1 kg)   SpO2 98%   BMI 33.36 kg/m   Pulse Readings from Last 3 Encounters:  02/11/17 96  10/11/16 69  10/05/16 61     Physical Exam  Constitutional: He is oriented to person, place, and time. He appears well-developed. He is active and cooperative.  Non-toxic appearance.  Cardiovascular: S1 normal, S2 normal, normal heart sounds, intact distal pulses and normal pulses. An irregularly irregular rhythm present. Tachycardia present. Exam reveals no gallop and no friction rub.  No murmur heard. Pulmonary/Chest: Effort normal. No stridor. No tachypnea. No respiratory distress. He has no wheezes. He has no rales.  Abdominal: He exhibits no distension.  Musculoskeletal: He exhibits no edema, tenderness or deformity.  Neurological: He is alert and oriented to person, place, and time. He displays normal reflexes. No cranial nerve deficit. He exhibits normal muscle tone. Coordination normal.  Skin: Skin is warm and dry. He is not diaphoretic. No pallor.  Vitals reviewed.  EKG: Irregularly irregular rate at 108.    Results for orders placed or performed in visit on 02/11/17 (from the past 72 hour(s))  POCT glucose (manual entry)     Status: None   Collection Time: 02/11/17 10:22 AM  Result Value Ref Range   POC Glucose 87 70 - 99 mg/dl  POCT CBC     Status: Abnormal   Collection Time: 02/11/17 10:23 AM  Result Value Ref Range   WBC 7.3 4.6 - 10.2 K/uL   Lymph, poc 2.8 0.6 - 3.4   POC LYMPH PERCENT 38.2 10 - 50 %L   MID (cbc) 0.4 0 - 0.9  POC MID % 5.2 0 - 12 %M   POC Granulocyte 4.1 2 - 6.9   Granulocyte percent 56.6 37 - 80 %G   RBC 4.32 (A) 4.69 - 6.13 M/uL   Hemoglobin 14.8 14.1 - 18.1 g/dL   HCT, POC 43.9 43.5 - 53.7 %   MCV 101.6 (A) 80 - 97 fL   MCH, POC 34.3 (A) 27 - 31.2 pg   MCHC 33.8 31.8 - 35.4 g/dL   RDW, POC 13.2 %   Platelet Count, POC 276 142 - 424 K/uL   MPV 7.1 0 - 99.8 fL  POCT urinalysis dipstick     Status: Abnormal   Collection Time: 02/11/17 10:49 AM  Result Value Ref Range   Color, UA yellow yellow   Clarity, UA clear clear   Glucose, UA negative negative mg/dL    Bilirubin, UA negative negative   Ketones, POC UA negative negative mg/dL   Spec Grav, UA 1.015 1.010 - 1.025   Blood, UA trace-intact (A) negative   pH, UA 5.5 5.0 - 8.0   Protein Ur, POC negative negative mg/dL   Urobilinogen, UA 0.2 0.2 or 1.0 E.U./dL   Nitrite, UA Negative Negative   Leukocytes, UA Negative Negative    Dg Chest 2 View  Result Date: 02/11/2017 CLINICAL DATA:  Palpitations, dizziness EXAM: CHEST  2 VIEW COMPARISON:  None. FINDINGS: Low lung volumes. Heart and mediastinal contours are within normal limits. No focal opacities or effusions. No acute bony abnormality. IMPRESSION: No active cardiopulmonary disease. Electronically Signed   By: Rolm Baptise M.D.   On: 02/11/2017 10:17    ASSESSMENT AND PLAN:  Evan Page was seen today for dizziness.  Diagnoses and all orders for this visit:  Dizziness: Patient with likely new onset afib.  His son will drive him to [facility] where he will be evaluated further.  -     EKG 12-Lead -     TSH -     POCT CBC -     POCT urinalysis dipstick -     DG Chest 2 View; Future -     POCT glucose (manual entry) -     Cancel: Troponin I  Irregular heart rhythm -     Basic Metabolic Panel    The patient is advised to call or return to clinic if he does not see an improvement in symptoms, or to seek the care of the closest emergency department if he worsens with the above plan.   Evan Page, MHS, PA-C [facility] at [facility] 02/11/2017 11:04 AM

## 2017-02-11 NOTE — Patient Instructions (Addendum)
  [address], [place], [place] [address]    IF you received an x-ray today, you will receive an invoice from [facility]. Please contact [facility] at [phone] with questions or concerns regarding your invoice.   IF you received labwork today, you will receive an invoice from [place]. Please contact [facility] at [phone] with questions or concerns regarding your invoice.   Our billing staff will not be able to assist you with questions regarding bills from these companies.  You will be contacted with the lab results as soon as they are available. The fastest way to get your results is to activate your My Chart account. Instructions are located on the last page of this paperwork. If you have not heard from [place] regarding the results in 2 weeks, please contact this office.

## 2017-02-11 NOTE — Progress Notes (Signed)
Please add on a b12 and folate.

## 2017-02-11 NOTE — Progress Notes (Deleted)
    02/11/2017 9:57 AM   DOB: 05/12/1960 / MRN: [id number]  SUBJECTIVE:  Evan Page is a 57 y.o. male presenting for   He has No Known Allergies.   He  has a past medical history of Cataract.    He  reports that he has quit smoking. he has never used smokeless tobacco. He reports that he drinks about 1.2 - 1.8 oz of alcohol per week. He reports that he does not use drugs. He  has no sexual activity history on file. The patient  has a past surgical history that includes wisdomteeth extraction.  His family history includes Diabetes in his father and mother; Drug abuse in his sister; Esophageal cancer in his father; Hyperlipidemia in his father, maternal grandmother, and mother.  ROS  The problem list and medications were reviewed and updated by myself where necessary and exist elsewhere in the encounter.   OBJECTIVE:  BP 138/84 (BP Location: Left Arm, Patient Position: Sitting, Cuff Size: Large)   Pulse 96   Resp 16   Ht 5' 8.19" (1.732 m)   Wt 220 lb 9.6 oz (100.1 kg)   SpO2 98%   BMI 33.36 kg/m   Physical Exam  No results found for this or any previous visit (from the past 72 hour(s)).  No results found.  ASSESSMENT AND PLAN:  There are no diagnoses linked to this encounter.  The patient is advised to call or return to clinic if he does not see an improvement in symptoms, or to seek the care of the closest emergency department if he worsens with the above plan.   Philis Fendt, MHS, PA-C [facility] 02/11/2017 9:57 AM

## 2017-02-12 LAB — BASIC METABOLIC PANEL
BUN / CREAT RATIO: 13 (ref 9–20)
BUN: 17 mg/dL (ref 6–24)
CO2: 22 mmol/L (ref 20–29)
CREATININE: 1.26 mg/dL (ref 0.76–1.27)
Calcium: 9.5 mg/dL (ref 8.7–10.2)
Chloride: 107 mmol/L — ABNORMAL HIGH (ref 96–106)
GFR calc Af Amer: 73 mL/min/{1.73_m2} (ref >59)
GFR, EST NON AFRICAN AMERICAN: 63 mL/min/{1.73_m2} (ref >59)
Glucose: 86 mg/dL (ref 65–99)
Potassium: 4.3 mmol/L (ref 3.5–5.2)
SODIUM: 145 mmol/L — AB (ref 134–144)

## 2017-02-12 LAB — TSH: TSH: 2.71 u[IU]/mL (ref 0.450–4.500)

## 2017-02-12 NOTE — Progress Notes (Signed)
Macrocytosis

## 2017-02-16 LAB — B12 AND FOLATE PANEL
FOLATE: 14.2 ng/mL (ref >3.0)
VITAMIN B 12: 555 pg/mL (ref 232–1245)

## 2017-02-16 LAB — SPECIMEN STATUS REPORT

## 2017-02-25 ENCOUNTER — Encounter (HOSPITAL_COMMUNITY): Payer: Self-pay

## 2017-02-25 ENCOUNTER — Ambulatory Visit (HOSPITAL_COMMUNITY): Admit: 2017-02-25 | Payer: BC Managed Care – PPO | Admitting: Cardiology

## 2017-02-25 SURGERY — CARDIOVERSION
Anesthesia: General

## 2017-03-03 DIAGNOSIS — I480 Paroxysmal atrial fibrillation: Secondary | ICD-10-CM | POA: Diagnosis present

## 2017-03-03 NOTE — H&P (Signed)
OFFICE VISIT NOTES COPIED TO EPIC FOR DOCUMENTATION  . History of Present Illness Laverda Page MD; 03/01/2017 12:14 PM) Patient words: Last OV 02/21/2017; 1 week f/u for afib with KEG.  The patient is a 57 year old male who presents for a Follow-up for Atrial fibrillation. Patient with no known medical issues, had been doing well until 02/13/2017 started noticing exertional dizziness and fatigue. He was found to be in atrial fibrillation. Since the episode of atrial fibrillation onset, he has not felt well with marked sinus and marked fatigue. Denies any chest pain. There is no family history of any arrhythmias or premature coronary artery disease. Patient has approximately 10 pack year h/o smoking, quit 1998. He has been scheduled for a sleep study. He is also set up for direct current cardioversion on 02/25/2017, this was discontinued as patient was in sinus rhythm during stress testing 02/18/2017 which revealed no evidence of ischemia and normal LVEF. He did undergo echocardiogram on 02/14/2017 which revealed normal LV systolic function with normal left atrial size.  I have started him on flecainide in the hopes of maintaining sinus rhythm and made him to return for EKG 4 days later, he was in atrial flutter. I did increase the dose of flecainide to 100 mg twice a day and he presents here for a one-week follow-up with repeat EKG. Continues to complain of fatigue and also dizziness. Is tolerating anticoagulation without bleeding diathesis.   Problem List/Past Medical Anderson Malta Sergeant; 02/28/2017 2:42 PM) Paroxysmal atrial fibrillation (I48.0)  EKG 02/21/2017: Atrial flutter with variable AV conduction, left axis deviation, left anterior fascicular. Cannot exclude inferior infarct old. Posterior infarct old. RVH. Normal QT interval. EKG 02/14/2017: Atrial fibrillation with controlled response at the rate of 71 bpm, left axis deviation, left anterior fascicular block. Cannot exclude  inferior-posterior infarct old. Nonspecific T abnormality. No significant change from EKG 02/11/2017. CHA2DS2-VASc Score is 1 with yearly risk of stroke of 1.2 %. (CHF; HTN; vasc disease DM, Male = 1; Age <65 =1; 65-74 = 2, >75 =3; stroke = 3). Obstructive sleep apnea, adult (T41.96)  Laboratory examination (Z01.89)  Labs 02/18/2017: Total cholesterol 132, triglycerides 116, HDL 41, LDL 68. Serum glucose 83 mg, BUN 15, creatinine 1.20, eGFR greater than 60 mL, potassium 4.5. Labs 02/11/2017: Urine analysis, trace blood, otherwise normal. HB 14.8/HCT 43.9. Microcytic index. Platelets 276. Serum glucose 87 mg. TSH normal, BMP normal, creatinine 1.26, eGFR 63 mL Labs 03/02/2016: BMP normal with normal serum creatinine at 1.04. Previously eGFR reduced to 50/58 in September 2017. Labs 10/20/2015: A1c 5.5%. Total cholesterol 146, triglycerides 306, HDL 38, LDL 47. Dyspnea on exertion (R06.09)  Chest x-ray PA and lateral view 02/11/2017: Low lung volumes, heart and mediastinal contours are normal. Echocardiogram 02/14/2017: Left ventricle cavity is normal in size. Normal global wall motion. Unable to evaluate diastolic function due to A. Fibrillation. Calculated EF 68%. Trace mitral regurgitation. Orthostatic hypotension (I95.1)  Mixed hyperlipidemia (E78.2)   Allergies Anderson Malta Sergeant; 02/28/2017 2:42 PM) No Known Drug Allergies [02/11/2017]:  Family History Anderson Malta Sergeant; 02/28/2017 2:42 PM) Mother  In good health. no heart issues; Father  In good health. no heart issues; Cancer Sister 4  2 older; 2 younger; no heaert issues Brother 2  1 older; 1 younger; no heart issues  Social History Anderson Malta Sergeant; 02/28/2017 2:42 PM) Current tobacco use  Former smoker. quit 1998; 1 pack daily for 10 years. Alcohol Use  Occasional alcohol use. wine Marital status  Single. Living Situation  Lives with  relatives. Number of Children  2.  Past Surgical History Anderson Malta Sergeant; 02/28/2017  2:42 PM) None [02/11/2017]:  Medication History Laverda Page, MD; 03/01/2017 12:17 PM) Flecainide Acetate (100MG Tablet, 1 (one) Tablet Oral two times daily as directed, Taken starting 02/21/2017) Active. Eliquis (5MG Tablet, 1 (one) Tablet Tablet Oral two times daily with food, Taken starting 02/11/2017) Active. Metoprolol Tartrate (25MG Tablet, 1 (one) Tablet Tablet Oral three times daily, Taken starting 02/11/2017) Active. Medications Reconciled (verbally)  Diagnostic Studies History Anderson Malta Sergeant; 02/28/2017 2:42 PM) Colonoscopy [2018]: Normal. polyphs Echocardiogram [02/14/2017]: Left ventricle cavity is normal in size. Normal global wall motion. Unable to evaluate diastolic function due to A. Fibrillation. Calculated EF 68%. Trace mitral regurgitation.    Review of Systems Laverda Page, MD; 03/01/2017 12:17 PM) General Not Present- Appetite Loss, Fatigue and Weight Gain. Respiratory Present- Difficulty Breathing on Exertion. Not Present- Chronic Cough and Wakes up from Sleep Wheezing or Short of Breath. Gastrointestinal Not Present- Black, Tarry Stool and Difficulty Swallowing. Musculoskeletal Not Present- Decreased Range of Motion and Muscle Atrophy. Neurological Present- Dizziness (improved). Not Present- Attention Deficit. Psychiatric Not Present- Personality Changes and Suicidal Ideation. Endocrine Not Present- Cold Intolerance and Heat Intolerance. Hematology Not Present- Abnormal Bleeding. All other systems negative  Vitals Anderson Malta Sergeant; 02/28/2017 2:48 PM) 02/28/2017 2:44 PM Weight: 218.31 lb Height: 68in Body Surface Area: 2.12 m Body Mass Index: 33.19 kg/m  Pulse: 60 (Regular)  P.OX: 100% (Room air) BP: 123/82 (Sitting, Left Arm, Standard)       Physical Exam Laverda Page, MD; 03/01/2017 12:17 PM) General Mental Status-Alert. General Appearance-Cooperative and Appears stated age. Build & Nutrition-Well built  and Mildly obese.  Head and Neck Thyroid Gland Characteristics - normal size and consistency and no palpable nodules.  Chest and Lung Exam Chest and lung exam reveals -quiet, even and easy respiratory effort with no use of accessory muscles, non-tender and on auscultation, normal breath sounds, no adventitious sounds.  Cardiovascular Cardiovascular examination reveals -carotid auscultation reveals no bruits, abdominal aorta auscultation reveals no bruits and no prominent pulsation, femoral artery auscultation bilaterally reveals normal pulses, no bruits, no thrills, normal pedal pulses bilaterally and no digital clubbing, cyanosis, edema, increased warmth or tenderness. Auscultation Rhythm - Irregularly irregular and Tachycardic. Heart Sounds - S1 is variable and S2 WNL. Murmurs & Other Heart Sounds - Murmur - No murmur.  Abdomen Inspection Contour - Obese. Palpation/Percussion Normal exam - Non Tender and No hepatosplenomegaly.  Neurologic Neurologic evaluation reveals -alert and oriented x 3 with no impairment of recent or remote memory. Motor-Grossly intact without any focal deficits.  Musculoskeletal Global Assessment Left Lower Extremity - no deformities, masses or tenderness, no known fractures. Right Lower Extremity - no deformities, masses or tenderness, no known fractures.    Assessment & Plan Laverda Page MD; 03/01/2017 12:17 PM) Paroxysmal atrial fibrillation (I48.0) Story: EKG 02/28/2017: Atrial flutter with 3:1 conduction, typical flutter. Leftward axis. Cannot exclude posterior infarct old. Normal QT interval. No evidence of ischemia. No significant change from EKG 02/21/2017   EKG 02/14/2017: Atrial fibrillation with controlled response at the rate of 71 bpm, left axis deviation, left anterior fascicular block. Cannot exclude inferior-posterior infarct old. Nonspecific T abnormality. No significant change from EKG 02/11/2017.  CHA2DS2-VASc Score is 1  with yearly risk of stroke of 1.2 %. (CHF; HTN; vasc disease DM, Male = 1; Age <65 =1; 65-74 = 2, >75 =3; stroke = 3). Current Plans Complete electrocardiogram (93000) Continued Flecainide Acetate 100MG, 1 (  one) Tablet two times daily as directed, #60, 30 days starting 02/28/2017, Ref. x1. METABOLIC PANEL, BASIC (95188) Dyspnea on exertion (R06.09) Story: Chest x-ray PA and lateral view 02/11/2017: Low lung volumes, heart and mediastinal contours are normal.  Echocardiogram 02/14/2017: Left ventricle cavity is normal in size. Normal global wall motion. Unable to evaluate diastolic function due to A. Fibrillation. Calculated EF 68%. Trace mitral regurgitation. Impression: Exercise sestamibi stress test 02/18/2017: 1. Resting EKG demonstrates normal sinus rhythm with frequent PACs, early R-wave progression in the anterior lead, cannot exclude posterior infarct old. Stress EKG was negative for myocardial ischemia. Patient went into atrial fibrillation with rapid ventricular response at peak exercise and remained in atrial fibrillation at the end of the study.Patient exercised on Bruce protocol for 7:48 minutes and achieved 9.79 METS. Stress test terminated due to 108% MPHR achieved (Target HR >85%) and fatigue. Normal blood pressure response. 2. This is a normal myocardial perfusion imaging study with a fixed defect suggesting an area of soft tissue attenuation in the inferior wall. Gated SPECT images reveal normal myocardial thickening and wall motion. The left ventricular ejection fraction was calculated to be 55%. This is a low risk study. Orthostatic hypotension (I95.1) Laboratory examination (Z01.89) Story: Labs 02/18/2017: Total cholesterol 132, triglycerides 116, HDL 41, LDL 68. Serum glucose 83 mg, BUN 15, creatinine 1.20, eGFR greater than 60 mL, potassium 4.5.  Labs 02/11/2017: Urine analysis, trace blood, otherwise normal. HB 14.8/HCT 43.9. Microcytic index. Platelets 276. Serum glucose 87  mg. TSH normal, BMP normal, creatinine 1.26, eGFR 63 mL  Labs 03/02/2016: BMP normal with normal serum creatinine at 1.04. Previously eGFR reduced to 50/58 in September 2017.  Labs 10/20/2015: A1c 5.5%. Total cholesterol 146, triglycerides 306, HDL 38, LDL 47.  Note:-  Recommendations:  Patient is recently tolerating metoprolol heart rate 25 mg t.i.d. and Eliquis without bleeding diathesis and heart rate was well controlled, continue flecainide 100 mg b.i.d. for now. OV in 2 weeks and will consider a routine treadmill stress once appropriate Flecainide dosing done. He persists to be in atrial flutter hence I'll schedule him for direct current conversion. I spoke to his son Nicole Kindred over the phone and explained his father's condition and need for cardioversion and procedure including complication. I did offer translation services to the patient directly also, patient states that he is able to understand and conversant.  Since last office visit he has made significant lifestyle changes and also dietary changes. Sleep study has been set up.  CC: Michael L. Carlis Abbott, PA-C    Signed by Laverda Page, MD (03/01/2017 12:18 PM)

## 2017-03-04 ENCOUNTER — Ambulatory Visit (HOSPITAL_COMMUNITY)
Admission: RE | Admit: 2017-03-04 | Discharge: 2017-03-04 | Disposition: A | Discharge status: Home / Self Care | Payer: BC Managed Care – PPO | Source: Ambulatory Visit | Attending: Cardiology | Admitting: Cardiology

## 2017-03-04 ENCOUNTER — Encounter (HOSPITAL_COMMUNITY): Payer: Self-pay | Admitting: *Deleted

## 2017-03-04 ENCOUNTER — Encounter (HOSPITAL_COMMUNITY)
Admission: RE | Disposition: A | Discharge status: Home / Self Care | Payer: Self-pay | Source: Ambulatory Visit | Attending: Cardiology

## 2017-03-04 ENCOUNTER — Ambulatory Visit (HOSPITAL_COMMUNITY): Payer: BC Managed Care – PPO | Admitting: Anesthesiology

## 2017-03-04 DIAGNOSIS — I480 Paroxysmal atrial fibrillation: Secondary | ICD-10-CM | POA: Diagnosis present

## 2017-03-04 DIAGNOSIS — I4891 Unspecified atrial fibrillation: Secondary | ICD-10-CM | POA: Diagnosis present

## 2017-03-04 DIAGNOSIS — I483 Typical atrial flutter: Secondary | ICD-10-CM | POA: Insufficient documentation

## 2017-03-04 DIAGNOSIS — E782 Mixed hyperlipidemia: Secondary | ICD-10-CM | POA: Insufficient documentation

## 2017-03-04 DIAGNOSIS — I951 Orthostatic hypotension: Secondary | ICD-10-CM | POA: Insufficient documentation

## 2017-03-04 DIAGNOSIS — Z87891 Personal history of nicotine dependence: Secondary | ICD-10-CM | POA: Diagnosis not present

## 2017-03-04 DIAGNOSIS — G4733 Obstructive sleep apnea (adult) (pediatric): Secondary | ICD-10-CM | POA: Insufficient documentation

## 2017-03-04 DIAGNOSIS — Z7901 Long term (current) use of anticoagulants: Secondary | ICD-10-CM | POA: Insufficient documentation

## 2017-03-04 HISTORY — PX: CARDIOVERSION: SHX1299

## 2017-03-04 SURGERY — CARDIOVERSION
Anesthesia: General

## 2017-03-04 MED ORDER — LIDOCAINE 2% (20 MG/ML) 5 ML SYRINGE
INTRAMUSCULAR | Status: DC | PRN
  Administered 2017-03-04: 100 mg via INTRAVENOUS

## 2017-03-04 MED ORDER — PROPOFOL 10 MG/ML IV BOLUS
INTRAVENOUS | Status: DC | PRN
  Administered 2017-03-04: 70 mg via INTRAVENOUS

## 2017-03-04 MED ORDER — SODIUM CHLORIDE 0.9 % IV SOLN
INTRAVENOUS | Status: DC | PRN
  Administered 2017-03-04: 09:00:00 via INTRAVENOUS

## 2017-03-04 NOTE — Transfer of Care (Signed)
Immediate Anesthesia Transfer of Care Note  Patient: [facility]  Procedure(s) Performed: CARDIOVERSION (N/A )  Patient Location: Endoscopy Unit  Anesthesia Type:General  Level of Consciousness: awake, alert , oriented and patient cooperative  Airway & Oxygen Therapy: Patient Spontanous Breathing and Patient connected to nasal cannula oxygen  Post-op Assessment: Report given to RN, Post -op Vital signs reviewed and stable and Patient moving all extremities X 4  Post vital signs: Reviewed and stable  Last Vitals:  Vitals:   03/04/17 0743  BP: (!) 152/71  Pulse: 63  Resp: 12  Temp: 36.8 C  SpO2: 100%    Last Pain:  Vitals:   03/04/17 0743  TempSrc: Oral         Complications: No apparent anesthesia complications

## 2017-03-04 NOTE — CV Procedure (Signed)
Direct current cardioversion:  Indication symptomatic A. Fibrillation/flutter.  Procedure: Using 70 mg of IV Propofol and 100 IV Lidocaine (for reducing venous pain) for achieving deep sedation, synchronized direct current cardioversion performed. Patient was delivered with 50 Joules of electricity X 1 with success to NSR. Patient tolerated the procedure well. No immediate complication noted.

## 2017-03-04 NOTE — Anesthesia Procedure Notes (Signed)
Procedure Name: General with mask airway Performed by: Renato Shin, CRNA Pre-anesthesia Checklist: Patient identified Patient Re-evaluated:Patient Re-evaluated prior to induction Oxygen Delivery Method: Circle system utilized Preoxygenation: Pre-oxygenation with 100% oxygen Induction Type: IV induction Dental Injury: Teeth and Oropharynx as per pre-operative assessment

## 2017-03-04 NOTE — Interval H&P Note (Signed)
History and Physical Interval Note:  03/04/2017 9:10 AM  [name]  has presented today for surgery, with the diagnosis of AFIB  The various methods of treatment have been discussed with the patient and family. After consideration of risks, benefits and other options for treatment, the patient has consented to  Procedure(s): CARDIOVERSION (N/A) as a surgical intervention .  The patient's history has been reviewed, patient examined, no change in status, stable for surgery.  I have reviewed the patient's chart and labs.  Questions were answered to the patient's satisfaction.     Evan Page

## 2017-03-04 NOTE — Anesthesia Postprocedure Evaluation (Signed)
Anesthesia Post Note  Patient: [name]  Procedure(s) Performed: CARDIOVERSION (N/A )     Patient location during evaluation: PACU Anesthesia Type: General Level of consciousness: awake and alert Pain management: pain level controlled Vital Signs Assessment: post-procedure vital signs reviewed and stable Respiratory status: spontaneous breathing, nonlabored ventilation, respiratory function stable and patient connected to nasal cannula oxygen Cardiovascular status: blood pressure returned to baseline and stable Postop Assessment: no apparent nausea or vomiting Anesthetic complications: no    Last Vitals:  Vitals:   03/04/17 0930 03/04/17 0940  BP: 104/66 102/72  Pulse: (!) 50 (!) 50  Resp: 13 13  Temp:    SpO2: 95% 99%    Last Pain:  Vitals:   03/04/17 0925  TempSrc: Oral                 Shatoya Roets EDWARD

## 2017-03-04 NOTE — Discharge Instructions (Signed)
Cardioversin elctrica, cuidados posteriores Magazine features editor, Care After) Siga estas instrucciones durante las prximas semanas. Estas indicaciones le proporcionan informacin general acerca de cmo deber cuidarse despus del procedimiento. El mdico tambin podr darle instrucciones ms especficas. El tratamiento ha sido planificado segn las prcticas mdicas actuales, pero en algunos casos pueden ocurrir problemas. Comunquese con el mdico si tiene algn problema o tiene dudas despus del procedimiento. QU ESPERAR DESPUS DEL PROCEDIMIENTO Despus del procedimiento, es normal tener:  Cierto enrojecimiento en la piel en el lugar en que se enviaron las descargas. Si siente dolor, podr aplicarse una locin para las quemaduras de sol o hidrocortisona.  Posible retorno de un ritmo cardaco anormal luego de algunas horas o das despus del procedimiento. [place] los medicamentos solamente como se lo haya indicado el mdico. Asegrese de que comprende cmo y cundo puede tomar sus medicamentos.  Aprenda cmo sentir su pulso y a controlarlo.  Limite su actividad durante 48horas despus del procedimiento o segn las indicaciones del mdico.  Evite o minimice la cafena y otros estimulantes segn las indicaciones del mdico. SOLICITE ATENCIN MDICA SI:  Siente que el corazn late [place] rpido o el pulso no es regular.  Tiene dudas relacionadas con los medicamentos.  Tiene una hemorragia que no se detiene. SOLICITE ATENCIN MDICA DE INMEDIATO SI:  Se siente mareado o sufre un desmayo.  Le cuesta trabajo respirar o siente que le falta el aire.  Hay un cambio en las [employer].  Arrastra las palabras al hablar o tiene dificultad para mover un brazo o una pierna de un lado del cuerpo.  Tiene un intenso calambre muscular que no se calma.  Sus dedos o la mano estn fros o de Optician, dispensing. Esta informacin no tiene Hydrologist el consejo del mdico. Asegrese de hacerle al mdico cualquier pregunta que tenga. Document Released: 11/08/2012 Document Revised: 02/08/2014 Document Reviewed: 07/25/2015 Elsevier Interactive Patient Education  2018 [employer]. Electrical Cardioversion, Care After This sheet gives you information about how to care for yourself after your procedure. Your health care provider may also give you more specific instructions. If you have problems or questions, contact your health care provider. What can I expect after the procedure? After the procedure, it is common to have:  Some redness on the skin where the shocks were given.  Follow these instructions at home:  Do not drive for 24 hours if you were given a medicine to help you relax (sedative).  Take over-the-counter and prescription medicines only as told by your health care provider.  Ask your health care provider how to check your pulse. Check it often.  Rest for 48 hours after the procedure or as told by your health care provider.  Avoid or limit your caffeine use as told by your health care provider. Contact a health care provider if:  You feel like your heart is beating too quickly or your pulse is not regular.  You have a serious muscle cramp that does not go away. Get help right away if:  You have discomfort in your chest.  You are dizzy or you feel faint.  You have trouble breathing or you are short of breath.  Your speech is slurred.  You have trouble moving an arm or leg on one side of your body.  Your fingers or toes turn cold or blue. This information is not intended to replace advice given to you by your health care  provider. Make sure you discuss any questions you have with your health care provider. Document Released: 11/08/2012 Document Revised: 08/22/2015 Document Reviewed: 07/25/2015 Elsevier Interactive Patient Education  [employer].

## 2017-03-04 NOTE — Anesthesia Procedure Notes (Deleted)
Procedure Name: General with mask airway Date/Time: 03/04/2017 9:12 AM Performed by: Renato Shin, CRNA Pre-anesthesia Checklist: Patient identified, Emergency Drugs available, Suction available and Patient being monitored Patient Re-evaluated:Patient Re-evaluated prior to induction Oxygen Delivery Method: Ambu bag Preoxygenation: Pre-oxygenation with 100% oxygen Induction Type: IV induction Ventilation: Mask ventilation without difficulty Placement Confirmation: positive ETCO2,  CO2 detector and breath sounds checked- equal and bilateral Dental Injury: Teeth and Oropharynx as per pre-operative assessment

## 2017-03-04 NOTE — Anesthesia Preprocedure Evaluation (Addendum)
Anesthesia Evaluation  Patient identified by MRN, date of birth, ID band Patient awake    Reviewed: Allergy & Precautions, H&P , Patient's Chart, lab work & pertinent test results, reviewed documented beta blocker date and time   Airway Mallampati: II  TM Distance: >3 FB Neck ROM: full    Dental no notable dental hx.    Pulmonary former smoker,    Pulmonary exam normal breath sounds clear to auscultation       Cardiovascular  Rhythm:regular Rate:Normal     Neuro/Psych    GI/Hepatic   Endo/Other    Renal/GU      Musculoskeletal   Abdominal   Peds  Hematology   Anesthesia Other Findings   Reproductive/Obstetrics                             Anesthesia Physical Anesthesia Plan  ASA: II  Anesthesia Plan: General   Post-op Pain Management:    Induction: Intravenous  PONV Risk Score and Plan: Treatment may vary due to age or medical condition  Airway Management Planned: Mask and Natural Airway  Additional Equipment:   Intra-op Plan:   Post-operative Plan: Extubation in OR  Informed Consent: I have reviewed the patients History and Physical, chart, labs and discussed the procedure including the risks, benefits and alternatives for the proposed anesthesia with the patient or authorized representative who has indicated his/her understanding and acceptance.   Dental Advisory Given  Plan Discussed with: CRNA and Surgeon  Anesthesia Plan Comments: (  )       Anesthesia Quick Evaluation

## 2017-03-06 ENCOUNTER — Encounter (HOSPITAL_COMMUNITY): Payer: Self-pay | Admitting: Cardiology

## 2017-03-10 ENCOUNTER — Ambulatory Visit: Payer: BC Managed Care – PPO | Admitting: Neurology

## 2017-03-10 ENCOUNTER — Encounter: Payer: Self-pay | Admitting: Neurology

## 2017-03-10 VITALS — BP 129/88 | HR 88 | Ht 68.0 in | Wt 214.0 lb

## 2017-03-10 DIAGNOSIS — E669 Obesity, unspecified: Secondary | ICD-10-CM | POA: Diagnosis not present

## 2017-03-10 DIAGNOSIS — R0681 Apnea, not elsewhere classified: Secondary | ICD-10-CM | POA: Diagnosis not present

## 2017-03-10 DIAGNOSIS — G4719 Other hypersomnia: Secondary | ICD-10-CM | POA: Diagnosis not present

## 2017-03-10 DIAGNOSIS — R0683 Snoring: Secondary | ICD-10-CM | POA: Diagnosis not present

## 2017-03-10 DIAGNOSIS — I4819 Other persistent atrial fibrillation: Secondary | ICD-10-CM

## 2017-03-10 DIAGNOSIS — I481 Persistent atrial fibrillation: Secondary | ICD-10-CM | POA: Diagnosis not present

## 2017-03-10 DIAGNOSIS — Z9889 Other specified postprocedural states: Secondary | ICD-10-CM

## 2017-03-10 DIAGNOSIS — R351 Nocturia: Secondary | ICD-10-CM

## 2017-03-10 NOTE — Patient Instructions (Addendum)

## 2017-03-10 NOTE — Progress Notes (Addendum)
Subjective:    Patient ID: Evan Page is a 57 y.o. male.  HPI     Star Age, MD, PhD [facility] [address], [address]O. [place], [place] [address]  Dear Evan Page,  I saw your patient, Evan Page, upon your kind request in my neurologic clinic today for initial consultation of his sleep disorder, in particular, concern for underlying obstructive sleep apnea. The patient is accompanied by an interpreter today. As you know, [name] is a 57 year old right-handed gentleman with an underlying medical history of atrial fibrillation, hyperlipidemia, and obesity, who reports snoring and excessive daytime somnolence. I reviewed your office note from 02/13/2017, which you kindly included. His Epworth sleepiness score is 20 out of 24 today, fatigue score is 58 out of 63. He reports loud snoring and also positive in his breathing per girlfriend report. He lives alone, girlfriend lives in [place]. He has one son and one daughter. His son lives with them along with his family which includes 2 children. Patient bedtime is around 9, rise time around 4. He has to be at work at 102. 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 He has nocturia about twice per average night. He has had occasional morning headaches. He drinks caffeine in the form of coffee, usually just one cup per day, typically no sodas. He quit smoking some 20 years ago. He does drink occasional wine, 1-2 glasses, namely on weekends, not daily. Of note, the patient had a elective cardioversion on 03/04/2017. I reviewed the records. He was in sinus rhythm after his cardioversion.  His Past Medical History Is Significant For: Past Medical History:  Diagnosis Date  . Cataract    left     His Past Surgical History Is Significant For: Past Surgical History:  Procedure Laterality Date  . CARDIOVERSION N/A 03/04/2017   Procedure: CARDIOVERSION;  Surgeon: Adrian Prows, MD;   Location: [facility] ENDOSCOPY;  Service: Cardiovascular;  Laterality: N/A;  . wisdomteeth extraction     3 removed - 1 remains    His Family History Is Significant For: Family History  Problem Relation Age of Onset  . Diabetes Mother   . Hyperlipidemia Mother   . Diabetes Father   . Hyperlipidemia Father   . Esophageal cancer Father   . Drug abuse Sister   . Hyperlipidemia Maternal Grandmother   . Colon cancer Neg Hx   . Pancreatic cancer Neg Hx   . Prostate cancer Neg Hx   . Rectal cancer Neg Hx   . Stomach cancer Neg Hx     His Social History Is Significant For: Social History   Socioeconomic History  . Marital status: Divorced    Spouse name: None  . Number of children: None  . Years of education: None  . Highest education level: None  Social Needs  . Financial resource strain: None  . Food insecurity - worry: None  . Food insecurity - inability: None  . Transportation needs - medical: None  . Transportation needs - non-medical: None  Occupational History  . None  Tobacco Use  . Smoking status: Former Research scientist (life sciences)  . Smokeless tobacco: Never Used  Substance and Sexual Activity  . Alcohol use: Yes    Alcohol/week: 1.2 - 1.8 oz    Types: 2 - 3 Glasses of wine per week    Comment: occasionally  . Drug use: No  . Sexual activity: None  Other Topics Concern  . None  Social History Narrative   **  Merged History Encounter **        His Allergies Are:  No Known Allergies:   His Current Medications Are:  Outpatient Encounter Medications as of 03/10/2017  Medication Sig  . ELIQUIS 5 MG TABS tablet Take 5 mg by mouth 2 (two) times daily.  . flecainide (TAMBOCOR) 100 MG tablet Take 100 mg by mouth 2 (two) times daily.  . metoprolol tartrate (LOPRESSOR) 25 MG tablet Take 25 mg by mouth 3 (three) times daily.  . [DISCONTINUED] HYDROcodone-homatropine (HYCODAN) 5-1.5 MG/5ML syrup Take 5 mLs by mouth every 8 (eight) hours as needed for cough. (Patient not taking: Reported on  02/11/2017)   No facility-administered encounter medications on file as of 03/10/2017.   :  Review of Systems:  Out of a complete 14 point review of systems, all are reviewed and negative with the exception of these symptoms as listed below:  Review of Systems  Neurological:       Pt presents today to discuss his sleep. Pt has never had a sleep study but does endorse snoring.  Epworth Sleepiness Scale 0= would never doze 1= slight chance of dozing 2= moderate chance of dozing 3= high chance of dozing  Sitting and reading: 3 Watching TV: 3 Sitting inactive in a public place (ex. Theater or meeting): 3 As a passenger in a car for an hour without a break: 2 Lying down to rest in the afternoon: 3 Sitting and talking to someone: 2 Sitting quietly after lunch (no alcohol): 3 In a car, while stopped in traffic: 1 Total: 20    Objective:  Neurological Exam  Physical Exam Physical Examination:   Vitals:   03/10/17 1546  BP: 129/88  Pulse: 88   General Examination: The patient is a very pleasant 57 y.o. male in no acute distress. He appears well-developed and well-nourished and well groomed.   HEENT: Normocephalic, atraumatic, pupils are equal, round and reactive to light and accommodation. He wears corrective eyeglasses. Extraocular tracking is good without limitation to gaze excursion or nystagmus noted. Normal smooth pursuit is noted. Hearing is grossly intact. Face is symmetric with normal facial animation and normal facial sensation. Speech is clear with no dysarthria noted. There is no hypophonia. There is no lip, neck/head, jaw or voice tremor. Neck is supple with full range of passive and active motion. There are no carotid bruits on auscultation. Oropharynx exam reveals: mild mouth dryness, good dental hygiene and moderate airway crowding, due to smaller airway entry, tonsils of 1+, lower reaching uvula. Mallampati is class II. Tongue protrudes centrally and palate elevates  symmetrically. Neck size is 18.25 inches. He has a Moderate overbite.   Chest: Clear to auscultation without wheezing, rhonchi or crackles noted.  Heart: S1+S2+0, irregularly irregular, no murmurs, rubs or gallops noted.   Abdomen: Soft, non-tender and non-distended with normal bowel sounds appreciated on auscultation.  Extremities: There is no pitting edema in the distal lower extremities bilaterally. Pedal pulses are intact.  Skin: Warm and dry without trophic changes noted.  Musculoskeletal: exam reveals no obvious joint deformities, tenderness or joint swelling or erythema.   Neurologically:  Mental status: The patient is awake, alert and oriented in all 4 spheres. His immediate and remote memory, attention, language skills and fund of knowledge are appropriate. There is no evidence of aphasia, agnosia, apraxia or anomia. Speech is clear with normal prosody and enunciation. Thought process is linear. Mood is normal and affect is normal.  Cranial nerves II - XII are as described  above under HEENT exam. In addition: shoulder shrug is normal with equal shoulder height noted. Motor exam: Normal bulk, strength and tone is noted. There is no drift, tremor or rebound. Romberg is negative. Reflexes are 1+ throughout. Fine motor skills and coordination: intact with normal finger taps, normal hand movements, normal rapid alternating patting, normal foot taps and normal foot agility.  Cerebellar testing: No dysmetria or intention tremor on finger to nose testing. Heel to shin is unremarkable bilaterally. There is no truncal or gait ataxia.  Sensory exam: intact to light touch in the upper and lower extremities.  Gait, station and balance: He stands easily. No veering to one side is noted. No leaning to one side is noted. Posture is age-appropriate and stance is narrow based. Gait shows normal stride length and normal pace. No problems turning are noted. Tandem walk is unremarkable.   Assessment and  Plan:  In summary, [facility] is a very pleasant 57 y.o.-year old male with an underlying medical history of atrial fibrillation, hyperlipidemia, and obesity, whose history and physical exam concerning for obstructive sleep apnea (OSA).  He has a follow-up appointment on Monday with you, he may be back in A. Fib.  I had a long chat with the patient about my findings and the diagnosis of OSA, its prognosis and treatment options. We talked about medical treatments, surgical interventions and non-pharmacological approaches. I explained in particular the risks and ramifications of untreated moderate to severe OSA, especially with respect to developing cardiovascular disease down the Road, including congestive heart failure, difficult to treat hypertension, cardiac arrhythmias, or stroke. Even type 2 diabetes has, in part, been linked to untreated OSA. Symptoms of untreated OSA include daytime sleepiness, memory problems, mood irritability and mood disorder such as depression and anxiety, lack of energy, as well as recurrent headaches, especially morning headaches. We talked about trying to maintain a healthy lifestyle in general, as well as the importance of weight control. I encouraged the patient to eat healthy, exercise daily and keep well hydrated, to keep a scheduled bedtime and wake time routine, to not skip any meals and eat healthy snacks in between meals. I advised the patient not to drive when feeling sleepy. I recommended the following at this time: sleep study with potential positive airway pressure titration. (We will score hypopneas at 3% and split the sleep study into diagnostic and treatment portion, if the estimated. 2 hour AHI is >20/h).   I explained the sleep test procedure to the patient and also outlined possible surgical and non-surgical treatment options of OSA, including the use of a custom-made dental device (which would require a referral to a specialist dentist or  oral surgeon), upper airway surgical options, such as pillar implants, radiofrequency surgery, tongue base surgery, and UPPP (which would involve a referral to an ENT surgeon). Rarely, jaw surgery such as mandibular advancement may be considered.  I also explained the CPAP treatment option to the patient, who indicated that he would be willing to try CPAP if the need arises. I explained the importance of being compliant with PAP treatment, not only for insurance purposes but primarily to improve His symptoms, and for the patient's long term health benefit, including to reduce His cardiovascular risks. I answered all his questions today and the patient was in agreement. I would like to see him back after the sleep study is completed and encouraged him to call with any interim questions, concerns, problems or updates.   Thank you very much  for allowing me to participate in the care of this nice patient. If I can be of any further assistance to you please do not hesitate to call me at [phone].  Sincerely,   Star Age, MD, PhD

## 2017-03-14 ENCOUNTER — Institutional Professional Consult (permissible substitution): Payer: BC Managed Care – PPO | Admitting: Neurology

## 2017-04-01 ENCOUNTER — Other Ambulatory Visit: Payer: Self-pay | Admitting: Cardiology

## 2017-04-01 ENCOUNTER — Ambulatory Visit: Payer: BC Managed Care – PPO | Admitting: Cardiology

## 2017-04-01 ENCOUNTER — Encounter: Payer: Self-pay | Admitting: Cardiology

## 2017-04-01 VITALS — BP 122/78 | HR 78 | Ht 68.0 in | Wt 210.0 lb

## 2017-04-01 DIAGNOSIS — I483 Typical atrial flutter: Secondary | ICD-10-CM

## 2017-04-01 DIAGNOSIS — I4819 Other persistent atrial fibrillation: Secondary | ICD-10-CM

## 2017-04-01 DIAGNOSIS — I481 Persistent atrial fibrillation: Secondary | ICD-10-CM | POA: Diagnosis not present

## 2017-04-01 DIAGNOSIS — I480 Paroxysmal atrial fibrillation: Secondary | ICD-10-CM

## 2017-04-01 NOTE — Patient Instructions (Signed)
Medication Instructions:  Your physician recommends that you continue on your current medications as directed. Please refer to the Current Medication list given to you today.  * If you need a refill on your cardiac medications before your next appointment, please call your pharmacy. *  * MAKE SURE TO CONTINUE TAKING YOUR ELIQUIS.  YOU WILL TAKE THIS  MEDICATION ALL THE WAY UP TO THE DAY OF YOUR PROCEDURE.  YOU CANNOT MISS ANY DOSES - If you do please call the office  because we will need to reschedule your procedure. *  Labwork: Your physician recommends that you return for pre procedure lab work on 04/29/17.  *Will notify you of abnormal results, otherwise continue current treatment plan.*  Testing/Procedures: Your physician has requested that you have cardiac CT within 7 days PRIOR to your ablation procedure. Cardiac computed tomography (CT) is a painless test that uses an x-ray machine to take clear, detailed pictures of your heart. For further information please visit [facility]. Please follow the instructions below, located under the special instructions section.  *Our office will first pre-certify this test (if required) with your insurance & then     call you to schedule this*  Your physician has recommended that you have an ablation. Catheter ablation is a medical procedure used to treat some cardiac arrhythmias (irregular heartbeats). During catheter ablation, a long, thin, flexible tube is put into a blood vessel in your groin (upper thigh), or neck. This tube is called an ablation catheter. It is then guided to your heart through the blood vessel. Radio frequency waves destroy small areas of heart tissue where abnormal heartbeats may cause an arrhythmia to start. Please follow the instructions below, located under the special instructions section.  Follow-Up: You are scheduled for a follow-up appointment on 04/29/2017 @ 3:45 p.m.with Dr. Curt Bears - for history/physical and pre  procedure lab work.  Your physician recommends that you schedule a follow-up appointment in: 4 weeks, after your procedure on 05/06/2017, with Roderic Palau in the AFib clinic.  Your physician recommends that you schedule a follow-up appointment in: 3 months, after your procedure on 05/06/2017, with Dr. Curt Bears.  * Please note that any paperwork needing to be filled out by the provider will need to be addressed at the front desk prior to seeing the provider.  Please note that any FMLA, disability or other documents regarding health condition is subject to a $25.00 charge that must be received prior to completion of paperwork in the form of a money order or check. *   Thank you for choosing [facility]!!   Trinidad Curet, RN [phone]  Any Other Special Instructions Will Be Listed Below (If Applicable).                CT INSTRUCTIONS Please arrive at the [facility] main entrance of [facility] at _________ AM/PM (30-45 minutes prior to test start time)  [facility] [address] [place], [place] [address] [phone]  Proceed to the [facility] Radiology Department (First Floor).  Please follow these instructions carefully (unless otherwise directed):  Hold all erectile dysfunction medications at least 48 hours prior to test.  On the Night Before the Test: . Drink plenty of water. . Do not consume any caffeinated/decaffeinated beverages or chocolate 12 hours prior to your test. . Do not take any antihistamines 12 hours prior to your test.  On the Day of the Test: . Drink plenty of water. Do not drink any water  within one hour of the test. . Do not eat any food 4 hours prior to the test. . You may take your regular medications prior to the test. . May sure to take your Metoprolol the day of this test.  After the Test: . Drink plenty of water. . After receiving IV contrast, you may experience a mild flushed feeling. This is normal. . On occasion,  you may experience a mild rash up to 24 hours after the test. This is not dangerous. If this occurs, you can take Benadryl 25 mg and increase your fluid intake. . If you experience trouble breathing, this can be serious. If it is severe call 911 IMMEDIATELY. If it is mild, please call our office. . If you take any of these medications: Glipizide/Metformin, Avandament, Glucavance, please do not take 48 hours after completing test.                             Electrophysiology/Ablation Procedure Instructions  You are scheduled for an Atrial Fibrillation Ablation on 05/06/2017  with Dr. Curt Bears.  1.   Please come to the [place], Entrance "A" at [facility] at 5:30 a.m.      on the day of your procedure. (The address is: [address]  1. Come prepared to stay overnight.  Please bring your insurance cards and a list of your medications.  3.   Come to the office on _________ for pre procedure lab work.  The lab at [facility] is open from 8:00 AM to 4:30 PM.   You do not have to be fasting.  4. Do not have anything to eat or drink after midnight the night before your procedure.  5.  A) MAKE SURE TO CONTINUE TAKING YOUR ELIQUIS.  YOU WILL TAKE THIS                  MEDICATION ALL THE WAY UP TO THE DAY OF YOUR PROCEDURE. YOU                  CANNOT MISS ANY DOSES.  If you do please call the office because we will                      need to rescheduled your procedure.       B)  Hold all of your morning medications the morning of your procedure.  6. You will be shaved for this procedure.  Typically both groins, chest and back are              shaven. We ask that you shave these areas yourself at home 1 - 2 days prior to               the procedure. If you are uncomfortable/unable, then we will shave these areas in the      hospital the day of your procedure.  7.  Educational material received:  EP   Ablation  * Occasionally, EP Studies and ablations can  become lengthy.  Please make your family aware of this before your procedure starts.  Average time ranges from 2-8 hours for EP studies/ablations.  Your physician will locate your family after the procedure with the results.  * If you have any questions after you get home, please call Trinidad Curet, RN at [phone].    Cardiac Ablation Cardiac  ablation is a procedure to disable (ablate) a small amount of heart tissue in very specific places. The heart has many electrical connections. Sometimes these connections are abnormal and can cause the heart to beat very fast or irregularly. Ablating some of the problem areas can improve the heart rhythm or return it to normal. Ablation may be done for people who:  Have Wolff-Parkinson-White syndrome.  Have fast heart rhythms (tachycardia).  Have taken medicines for an abnormal heart rhythm (arrhythmia) that were not effective or caused side effects.  Have a high-risk heartbeat that may be life-threatening.  During the procedure, a small incision is made in the neck or the groin, and a long, thin, flexible tube (catheter) is inserted into the incision and moved to the heart. Small devices (electrodes) on the tip of the catheter will send out electrical currents. A type of X-ray (fluoroscopy) will be used to help guide the catheter and to provide images of the heart. Tell a health care provider about:  Any allergies you have.  All medicines you are taking, including vitamins, herbs, eye drops, creams, and over-the-counter medicines.  Any problems you or family members have had with anesthetic medicines.  Any blood disorders you have.  Any surgeries you have had.  Any medical conditions you have, such as kidney failure.  Whether you are pregnant or may be pregnant. What are the risks? Generally, this is a safe procedure. However, problems may occur, including:  Infection.  Bruising and bleeding at the catheter insertion site.  Bleeding  into the chest, especially into the sac that surrounds the heart. This is a serious complication.  Stroke or blood clots.  Damage to other structures or organs.  Allergic reaction to medicines or dyes.  Need for a permanent pacemaker if the normal electrical system is damaged. A pacemaker is a small computer that sends electrical signals to the heart and helps your heart beat normally.  The procedure not being fully effective. This may not be recognized until months later. Repeat ablation procedures are sometimes required.  What happens before the procedure?  Follow instructions from your health care provider about eating or drinking restrictions.  Ask your health care provider about: ? Changing or stopping your regular medicines. This is especially important if you are taking diabetes medicines or blood thinners. ? Taking medicines such as aspirin and ibuprofen. These medicines can thin your blood. Do not take these medicines before your procedure if your health care provider instructs you not to.  Plan to have someone take you home from the hospital or clinic.  If you will be going home right after the procedure, plan to have someone with you for 24 hours. What happens during the procedure?  To lower your risk of infection: ? Your health care team will wash or sanitize their hands. ? Your skin will be washed with soap. ? Hair may be removed from the incision area.  An IV tube will be inserted into one of your veins.  You will be given a medicine to help you relax (sedative).  The skin on your neck or groin will be numbed.  An incision will be made in your neck or your groin.  A needle will be inserted through the incision and into a large vein in your neck or groin.  A catheter will be inserted into the needle and moved to your heart.  Dye may be injected through the catheter to help your surgeon see the area of the heart that  needs treatment.  Electrical currents will  be sent from the catheter to ablate heart tissue in desired areas. There are three types of energy that may be used to ablate heart tissue: ? Heat (radiofrequency energy). ? Laser energy. ? Extreme cold (cryoablation).  When the necessary tissue has been ablated, the catheter will be removed.  Pressure will be held on the catheter insertion area to prevent excessive bleeding.  A bandage (dressing) will be placed over the catheter insertion area. The procedure may vary among health care providers and hospitals. What happens after the procedure?  Your blood pressure, heart rate, breathing rate, and blood oxygen level will be monitored until the medicines you were given have worn off.  Your catheter insertion area will be monitored for bleeding. You will need to lie still for a few hours to ensure that you do not bleed from the catheter insertion area.  Do not drive for 24 hours or as long as directed by your health care provider. Summary  Cardiac ablation is a procedure to disable (ablate) a small amount of heart tissue in very specific places. Ablating some of the problem areas can improve the heart rhythm or return it to normal.  During the procedure, electrical currents will be sent from the catheter to ablate heart tissue in desired areas. This information is not intended to replace advice given to you by your health care provider. Make sure you discuss any questions you have with your health care provider. Document Released: 06/06/2008 Document Revised: 12/08/2015 Document Reviewed: 12/08/2015 Elsevier Interactive Patient Education  [employer].

## 2017-04-01 NOTE — Progress Notes (Signed)
Electrophysiology Office Note   Date:  04/01/2017   ID:  [place], [place] 09-05-1960, MRN [id number]  PCP:  Tereasa Coop, PA-C  Cardiologist:  Einar Gip Primary Electrophysiologist:  Will Meredith Leeds, MD    Chief Complaint  Patient presents with  . Advice Only    PAF     History of Present Illness: Evan Page is a 57 y.o. male who is being seen today for the evaluation of atrial fibrillation at the request of Adrian Prows. Presenting today for electrophysiology evaluation.  He has atrial fibrillation.  He is currently managed with flecainide, metoprolol, and Eliquis.   On 02/13/17 he began noticing more exertional dizziness and fatigue.  Of note, he has a 10-pack-year history, quit smoking in 1998.  He has been scheduled for a sleep study.  He was put on flecainide and was noted to be in atrial flutter post flecainide.  He had a cardioversion after the diagnosis of atrial flutter to sinus rhythm.  Cardioversion was on a 03/04/17.   Today, he denies symptoms of chest pain, shortness of breath, orthopnea, PND, lower extremity edema, claudication, dizziness, presyncope, syncope, bleeding, or neurologic sequela. The patient is tolerating medications without difficulties.  He is continued to have episodes of palpitations that are associated with fatigue.  He says that his heart rate gets fast and irregular when this occurs.   Past Medical History:  Diagnosis Date  . Cataract    left    Past Surgical History:  Procedure Laterality Date  . CARDIOVERSION N/A 03/04/2017   Procedure: CARDIOVERSION;  Surgeon: Adrian Prows, MD;  Location: [facility];  Service: Cardiovascular;  Laterality: N/A;  . wisdomteeth extraction     3 removed - 1 remains     Current Outpatient Medications  Medication Sig Dispense Refill  . ELIQUIS 5 MG TABS tablet Take 5 mg by mouth 2 (two) times daily.  2  . flecainide (TAMBOCOR) 100 MG tablet Take 100 mg by mouth 2 (two) times daily.     . metoprolol tartrate (LOPRESSOR) 25 MG tablet Take 25 mg by mouth 3 (three) times daily.  2   No current facility-administered medications for this visit.     Allergies:   Patient has no known allergies.   Social History:  The patient  reports that he has quit smoking. he has never used smokeless tobacco. He reports that he drinks about 1.2 - 1.8 oz of alcohol per week. He reports that he does not use drugs.   Family History:  The patient's family history includes Diabetes in his father and mother; Drug abuse in his sister; Esophageal cancer in his father; Hyperlipidemia in his father, maternal grandmother, and mother.    ROS:  Please see the history of present illness.   Otherwise, review of systems is positive for fatigue, shortness of breath, palpitations, leg pain, snoring, dizziness.   All other systems are reviewed and negative.    PHYSICAL EXAM: VS:  BP 122/78   Pulse 78   Ht 5\' 8"  (1.727 m)   Wt 210 lb (95.3 kg)   BMI 31.93 kg/m  , BMI Body mass index is 31.93 kg/m. GEN: Well nourished, well developed, in no acute distress  HEENT: normal  Neck: no JVD, carotid bruits, or masses Cardiac: RRR; no murmurs, rubs, or gallops,no edema  Respiratory:  clear to auscultation bilaterally, normal work of breathing GI: soft, nontender, nondistended, + BS MS: no deformity or atrophy  Skin: warm and dry Neuro:  Strength and sensation are intact Psych: euthymic mood, full affect  EKG:  EKG is not ordered today. Personal review of the ekg ordered 03/04/17 shows sinus rhythm, rate 53, LAD, early RS transition  Recent Labs: 02/11/2017: BUN 17; Creatinine, Ser 1.26; Hemoglobin 14.8; Potassium 4.3; Sodium 145; TSH 2.710    Lipid Panel     Component Value Date/Time   CHOL 146 10/20/2015 1651   TRIG 306 (H) 10/20/2015 1651   HDL 38 (L) 10/20/2015 1651   CHOLHDL 3.8 10/20/2015 1651   VLDL 61 (H) 10/20/2015 1651   LDLCALC 47 10/20/2015 1651     Wt Readings from Last 3  Encounters:  04/01/17 210 lb (95.3 kg)  03/10/17 214 lb (97.1 kg)  02/11/17 220 lb 9.6 oz (100.1 kg)      Other studies Reviewed: Additional studies/ records that were reviewed today include: Myoview 02/18/17 - personally reviewed Review of the above records today demonstrates:  Low risk study, EF 55%  Echocardiogram 02/14/2017: Left ventricle cavity is normal in size. Normal global wall motion. Unable to evaluate diastolic function due to A. Fibrillation. Calculated EF 68%. Trace mitral regurgitation. Normal left atrial size  ASSESSMENT AND PLAN:  1.  Persistent atrial fibrillation/atrial flutter: Currently on Eliquis, flecainide, and metoprolol.  He is continued to have palpitations despite his antiarrhythmics.  Due to that he likely is failing antiarrhythmics and would prefer ablation.  Risks and benefits discussed.  Risks include bleeding, tamponade, heart block, stroke, damage to surrounding organs.  The patient understands the risks and is agreed to the procedure.  This patients CHA2DS2-VASc Score and unadjusted Ischemic Stroke Rate (% per year) is equal to 0.2 % stroke rate/year from a score of 0  Above score calculated as 1 point each if present [CHF, HTN, DM, Vascular=MI/PAD/Aortic Plaque, Age if 65-74, or Male] Above score calculated as 2 points each if present [Age > 75, or Stroke/TIA/TE]     Current medicines are reviewed at length with the patient today.   The patient does not have concerns regarding his medicines.  The following changes were made today:  none  Labs/ tests ordered today include:  No orders of the defined types were placed in this encounter.  Case discussed with primary cardiology  Disposition:   FU with Will Camnitz 1 months  Signed, Will Meredith Leeds, MD  04/01/2017 3:29 PM     [facility] [address] [place] [address] [phone] (office) [phone] (fax)

## 2017-04-05 NOTE — Addendum Note (Signed)
Addended by: Stanton Kidney on: 04/05/2017 03:28 PM   Modules accepted: Orders

## 2017-04-21 ENCOUNTER — Ambulatory Visit (INDEPENDENT_AMBULATORY_CARE_PROVIDER_SITE_OTHER): Payer: BC Managed Care – PPO | Admitting: Neurology

## 2017-04-21 DIAGNOSIS — R0683 Snoring: Secondary | ICD-10-CM

## 2017-04-21 DIAGNOSIS — I4819 Other persistent atrial fibrillation: Secondary | ICD-10-CM

## 2017-04-21 DIAGNOSIS — R351 Nocturia: Secondary | ICD-10-CM

## 2017-04-21 DIAGNOSIS — G4719 Other hypersomnia: Secondary | ICD-10-CM

## 2017-04-21 DIAGNOSIS — G4733 Obstructive sleep apnea (adult) (pediatric): Secondary | ICD-10-CM

## 2017-04-21 DIAGNOSIS — Z9889 Other specified postprocedural states: Secondary | ICD-10-CM

## 2017-04-21 DIAGNOSIS — E669 Obesity, unspecified: Secondary | ICD-10-CM

## 2017-04-21 DIAGNOSIS — R9431 Abnormal electrocardiogram [ECG] [EKG]: Secondary | ICD-10-CM

## 2017-04-21 DIAGNOSIS — R0681 Apnea, not elsewhere classified: Secondary | ICD-10-CM

## 2017-04-25 ENCOUNTER — Telehealth: Payer: Self-pay

## 2017-04-25 NOTE — Telephone Encounter (Signed)
-----   Message from Star Age, MD sent at 04/25/2017  8:41 AM EDT ----- Patient referred by Dr. Einar Gip, seen by me on 03/10/17, split night sleep study on 04/21/17. Please call and notify patient that the recent sleep study confirmed the diagnosis of severe OSA. He did well with CPAP during the study with significant improvement of the respiratory events. Therefore, I would like start the patient on CPAP therapy at home by prescribing a machine for home use. I placed the order in the chart.  He had evidence of irregular heartbeat, A fib during the study - not new.  Please advise patient that we need a follow up appointment with either myself or one of our nurse practitioners in about 10 weeks post set-up to check for how the patient is feeling and how well the patient is using the machine, etc. Please go ahead and schedule the appointment, while you have the patient on the phone and make sure patient understands the importance of keeping this window for the FU appointment, as it is often an insurance requirement. Failing to adhere to this may result in losing coverage for sleep apnea treatment, at which point most patients are left with a choice of returning the machine or paying out of pocket (and we want neither of this to happen!).  Please re-enforce the importance of compliance with treatment and the need for [place] to monitor compliance data - again an insurance requirement and usually a good feedback for the patient as far as how they are doing.  Also remind patient, that any PAP machine or mask issues should be first addressed with the DME company, who provided the machine/mask.  Please ask if patient has a preference regarding DME company, may depend on the insurance too.  Please arrange for CPAP set up at home through a DME company of patient's choice.  Once you have spoken to the patient you can close the phone encounter. Please fax/route report to referring provider, thanks,   Star Age, MD,  PhD [facility])

## 2017-04-25 NOTE — Telephone Encounter (Signed)
I called pt using interpreter Hassell Done, with [employer]. I advised pt that Dr. Rexene Alberts reviewed their sleep study results and found that pt has severe osa but did well during the study with a cpap. Dr. Rexene Alberts recommends that pt start a cpap at home. I reviewed PAP compliance expectations with the pt. Pt is agreeable to starting a CPAP. I advised pt that an order will be sent to a DME, [facility], and [facility] will call the pt within about one week after they file with the pt's insurance. [facility] will show the pt how to use the machine, fit for masks, and troubleshoot the CPAP if needed. A follow up appt was made for insurance purposes with Dr. Rexene Alberts on 08/01/17 at 2:00pm. Pt verbalized understanding to arrive 15 minutes early and bring their CPAP. A letter with all of this information in it will be mailed to the pt as a reminder. I verified with the pt that the address we have on file is correct. I also advised pt that he had evidence of an irregular heartbeat, a-fib, during the study, which is not new, pt reports being aware of this. Pt verbalized understanding of results. Pt had no questions at this time but was encouraged to call back if questions arise.

## 2017-04-25 NOTE — Progress Notes (Signed)
Patient referred by Dr. Einar Gip, seen by me on 03/10/17, split night sleep study on 04/21/17. Please call and notify patient that the recent sleep study confirmed the diagnosis of severe OSA. He did well with CPAP during the study with significant improvement of the respiratory events. Therefore, I would like start the patient on CPAP therapy at home by prescribing a machine for home use. I placed the order in the chart.  He had evidence of irregular heartbeat, A fib during the study - not new.  Please advise patient that we need a follow up appointment with either myself or one of our nurse practitioners in about 10 weeks post set-up to check for how the patient is feeling and how well the patient is using the machine, etc. Please go ahead and schedule the appointment, while you have the patient on the phone and make sure patient understands the importance of keeping this window for the FU appointment, as it is often an insurance requirement. Failing to adhere to this may result in losing coverage for sleep apnea treatment, at which point most patients are left with a choice of returning the machine or paying out of pocket (and we want neither of this to happen!).  Please re-enforce the importance of compliance with treatment and the need for [place] to monitor compliance data - again an insurance requirement and usually a good feedback for the patient as far as how they are doing.  Also remind patient, that any PAP machine or mask issues should be first addressed with the DME company, who provided the machine/mask.  Please ask if patient has a preference regarding DME company, may depend on the insurance too.  Please arrange for CPAP set up at home through a DME company of patient's choice.  Once you have spoken to the patient you can close the phone encounter. Please fax/route report to referring provider, thanks,   Star Age, MD, PhD [facility])

## 2017-04-25 NOTE — Addendum Note (Signed)
Addended by: Star Age on: 04/25/2017 08:43 AM   Modules accepted: Orders

## 2017-04-25 NOTE — Procedures (Signed)
PATIENT'S NAME:  Evan Page, Evan Page DOB:      1960/06/20      MR#:    [id number]     DATE OF RECORDING: 04/21/2017 REFERRING M.D.:  Philis Fendt, PA Study Performed:  Split-Night Titration Study HISTORY: 57 year old man with a history of atrial fibrillation, hyperlipidemia, and obesity, who reports snoring and excessive daytime somnolence. The patient endorsed the Epworth Sleepiness Scale at 20 points. The patient's weight 214 pounds with a height of 68 (inches), resulting in a BMI of 32.4 kg/m2. The patient's neck circumference measured 18.2 inches.  CURRENT MEDICATIONS: Eliquis, Tambocor, Lopressor   PROCEDURE:  This is a multichannel digital polysomnogram utilizing the Somnostar 11.2 system.  Electrodes and sensors were applied and monitored per AASM Specifications.   EEG, EOG, Chin and Limb EMG, were sampled at 200 Hz.  ECG, Snore and Nasal Pressure, Thermal Airflow, Respiratory Effort, CPAP Flow and Pressure, Oximetry was sampled at 50 Hz. Digital video and audio were recorded.      BASELINE STUDY WITHOUT CPAP RESULTS:  Lights Out was at 21:07 and Lights On at 05:01 for the night, split study started at 23:28, epoch 291. Total recording time (TRT) was 145, with a total sleep time (TST) of 129 minutes.   The patient's sleep latency was 8.5 minutes.  REM latency was 54.5 minutes.  The sleep efficiency was 89. %.    SLEEP ARCHITECTURE: WASO (Wake after sleep onset) was 5.5 minutes, Stage N1 was 6.5 minutes, Stage N2 was 78 minutes, Stage N3 was 29.5 minutes and Stage R (REM sleep) was 15 minutes.  The percentages were Stage N1 5.%, Stage N2 60.5%, Stage N3 22.9% and Stage R (REM sleep) 11.6%. The arousals were noted as: 8 were spontaneous, 0 were associated with PLMs, 23 were associated with respiratory events.  Audio and video analysis did not show any abnormal or unusual movements, behaviors, phonations or vocalizations. The patient took one bathroom breaks. Moderate to loud snoring  was noted. The EKG was in keeping with atrial fibrillation and PVCs.   RESPIRATORY ANALYSIS:  There were a total of 83 respiratory events:  57 obstructive apneas, 5 central apneas and 0 mixed apneas with a total of 62 apneas and an apnea index (AI) of 28.8. There were 21 hypopneas with a hypopnea index of 9.8. The patient also had 0 respiratory event related arousals (RERAs).  Snoring was noted.     The total APNEA/HYPOPNEA INDEX (AHI) was 38.6 /hour and the total RESPIRATORY DISTURBANCE INDEX was 38.6 /hour.  16 events occurred in REM sleep and 42 events in NREM. The REM AHI was 64, /hour versus a non-REM AHI of 35.3 /hour. The patient spent 123.5 minutes sleep time in the supine position 310 minutes in non-supine. The supine AHI was 60.6 /hour versus a non-supine AHI of 25.7 /hour.  OXYGEN SATURATION & C02:  The wake baseline 02 saturation was 88%, with the lowest being 67%. Time spent below 89% saturation equaled 22 minutes.  PERIODIC LIMB MOVEMENTS: The patient had a total of 0 Periodic Limb Movements.  The Periodic Limb Movement (PLM) index was 0 /hour and the PLM Arousal index was 0 /hour.  TITRATION STUDY WITH CPAP RESULTS:   The patient was fitted with medium nasal pillows, and CPAP was initiated at 5 cmH20 with heated humidity per AASM split night standards and pressure was advanced to 8 cmH20 because of hypopneas, apneas and desaturations.  At a PAP pressure of 8 cmH20, there was a reduction of  the AHI to 2.2/h, with non-supine REM sleep achieved and O2 nadir of 89%.   Total recording time (TRT) was 329 minutes, with a total sleep time (TST) of 304.5 minutes. The patient's sleep latency was 1.5 minutes. REM latency was 21.5 minutes.  The sleep efficiency was 92.6 %.    SLEEP ARCHITECTURE: Wake after sleep was 23 minutes, Stage N1 12.5 minutes, Stage N2 161 minutes, Stage N3 10 minutes and Stage R (REM sleep) 121 minutes. The percentages were: Stage N1 4.1%, Stage N2 52.9%, Stage N3 3.3%  and Stage R (REM sleep) 39.7%, which is increased and in keeping with rebound.  The arousals were noted as: 16 were spontaneous, 0 were associated with PLMs, 1 were associated with respiratory events.  RESPIRATORY ANALYSIS:  There were a total of 15 respiratory events: 0 obstructive apneas, 5 central apneas and 2 mixed apneas with a total of 7 apneas and an apnea index (AI) of 1.4. There were 8 hypopneas with a hypopnea index of 1.6 /hour. The patient also had 0 respiratory event related arousals (RERAs).      The total APNEA/HYPOPNEA INDEX  (AHI) was 3. /hour and the total RESPIRATORY DISTURBANCE INDEX was 3. /hour.  1 events occurred in REM sleep and 14 events in NREM. The REM AHI was .5 /hour versus a non-REM AHI of 4.6 /hour. REM sleep was achieved on a pressure of  cm/h2o (AHI was  .) The patient spent 25% of total sleep time in the supine position. The supine AHI was 7.1 /hour, versus a non-supine AHI of 1.6/hour.  OXYGEN SATURATION & C02:  The wake baseline 02 saturation was 98%, with the lowest being 89%. Time spent below 89% saturation equaled 0 minutes.  PERIODIC LIMB MOVEMENTS: The patient had a total of 0 Periodic Limb Movements. The Periodic Limb Movement (PLM) index was 0 /hour and the PLM Arousal index was 0 /hour.  Post-study, the patient indicated that sleep was better than usual.  POLYSOMNOGRAPHY IMPRESSION :   1. Obstructive Sleep Apnea (OSA)  2. Abnormal EKG  RECOMMENDATIONS:  1. This patient has severe obstructive sleep apnea and responded well on CPAP therapy. Due to non-supine REM sleep achieved on the final titration pressure of 8 cm and O2 nadir of 89%, AHI of 2.2/hour, I will recommend a home CPAP treatment pressure of 9 cm via medium nasal pillows, with heated humidity. The patient should be reminded to be fully compliant with PAP therapy to improve sleep related symptoms and decrease long term cardiovascular risks. Please note that untreated obstructive sleep apnea  carries additional perioperative morbidity. Patients with significant obstructive sleep apnea should receive perioperative PAP therapy and the surgeons and particularly the anesthesiologist should be informed of the diagnosis and the severity of the sleep disordered breathing. 2. The study showed occasional PVCs with persistent A fib, in keeping with his history.  3. The patient should be cautioned not to drive, work at heights, or operate dangerous or heavy equipment when tired or sleepy. Review and reiteration of good sleep hygiene measures should be pursued with any patient. 4. The patient will be seen in follow-up by Dr. Rexene Alberts at [facility] for discussion of the test results and further management strategies. The referring provider will be notified of the test results.  I certify that I have reviewed the entire raw data recording prior to the issuance of this report in accordance with the Standards of Accreditation of the [place] [employer] (AASM)   Star Age,  MD, PhD Diplomat, American Board of Psychiatry and Neurology (Neurology and Sleep Medicine)

## 2017-04-29 ENCOUNTER — Ambulatory Visit: Payer: BC Managed Care – PPO | Admitting: Cardiology

## 2017-04-29 ENCOUNTER — Encounter: Payer: Self-pay | Admitting: Cardiology

## 2017-04-29 VITALS — BP 122/84 | HR 67 | Ht 68.0 in | Wt 215.0 lb

## 2017-04-29 DIAGNOSIS — I481 Persistent atrial fibrillation: Secondary | ICD-10-CM | POA: Diagnosis not present

## 2017-04-29 DIAGNOSIS — Z01812 Encounter for preprocedural laboratory examination: Secondary | ICD-10-CM

## 2017-04-29 DIAGNOSIS — I4819 Other persistent atrial fibrillation: Secondary | ICD-10-CM

## 2017-04-29 MED ORDER — METOPROLOL TARTRATE 50 MG PO TABS: 75.0000 mg | ORAL_TABLET | Freq: Two times a day (BID) | ORAL | 3 refills | Status: DC

## 2017-04-29 NOTE — Addendum Note (Signed)
Addended by: Stanton Kidney on: 04/29/2017 04:16 PM   Modules accepted: Orders

## 2017-04-29 NOTE — Progress Notes (Signed)
Electrophysiology Office Note   Date:  04/29/2017   ID:  [facility] [place], [place] 07-28-1960, MRN [id number]  PCP:  Tereasa Coop, PA-C  Cardiologist:  Einar Gip Primary Electrophysiologist:  Will Meredith Leeds, MD    Chief Complaint  Patient presents with  . Follow-up    PAF     History of Present Illness: Evan Page is a 57 y.o. male who is being seen today for the evaluation of atrial fibrillation at the request of Adrian Prows. Presenting today for electrophysiology evaluation.  He has atrial fibrillation.  He is currently managed with flecainide, metoprolol, and Eliquis.   On 02/13/17 he began noticing more exertional dizziness and fatigue.  Of note, he has a 10-pack-year history, quit smoking in 1998.  He has been scheduled for a sleep study.  He was put on flecainide and was noted to be in atrial flutter post flecainide.  He had a cardioversion after the diagnosis of atrial flutter to sinus rhythm.  Cardioversion was on a 03/04/17.  He is planned for atrial fibrillation ablation 05/06/17.   Today, denies symptoms of chest pain, shortness of breath, orthopnea, PND, lower extremity edema, claudication, dizziness, presyncope, syncope, bleeding, or neurologic sequela. The patient is tolerating medications without difficulties.  He is continued to have episodes of atrial fibrillation.  His episodes occur multiple times a day.  He is quite anxious about his atrial fibrillation episode saying that they are quite debilitating.  He feels a fluttering in his chest and shortness of breath.   Past Medical History:  Diagnosis Date  . Cataract    left    Past Surgical History:  Procedure Laterality Date  . CARDIOVERSION N/A 03/04/2017   Procedure: CARDIOVERSION;  Surgeon: Adrian Prows, MD;  Location: [place];  Service: Cardiovascular;  Laterality: N/A;  . wisdomteeth extraction     3 removed - 1 remains     Current Outpatient Medications  Medication Sig Dispense  Refill  . ELIQUIS 5 MG TABS tablet Take 5 mg by mouth 2 (two) times daily.  2  . flecainide (TAMBOCOR) 100 MG tablet Take 100 mg by mouth 2 (two) times daily.    . metoprolol tartrate (LOPRESSOR) 25 MG tablet Take 25 mg by mouth 3 (three) times daily.  2   No current facility-administered medications for this visit.     Allergies:   Patient has no known allergies.   Social History:  The patient  reports that he has quit smoking. He has never used smokeless tobacco. He reports that he drinks about 1.2 - 1.8 oz of alcohol per week. He reports that he does not use drugs.   Family History:  The patient's family history includes Diabetes in his father and mother; Drug abuse in his sister; Esophageal cancer in his father; Hyperlipidemia in his father, maternal grandmother, and mother.    ROS:  Please see the history of present illness.   Otherwise, review of systems is positive for heat, chest pain, leg swelling, shortness of breath, palpitations, snoring, wheezing, balance problems, rash, dizziness.   All other systems are reviewed and negative.   PHYSICAL EXAM: VS:  BP 122/84   Pulse 67   Ht 5\' 8"  (1.727 m)   Wt 215 lb (97.5 kg)   SpO2 96%   BMI 32.69 kg/m  , BMI Body mass index is 32.69 kg/m. GEN: Well nourished, well developed, in no acute distress  HEENT: normal  Neck: no JVD, carotid bruits, or masses Cardiac: RRR;  no murmurs, rubs, or gallops,no edema  Respiratory:  clear to auscultation bilaterally, normal work of breathing GI: soft, nontender, nondistended, + BS MS: no deformity or atrophy  Skin: warm and dry Neuro:  Strength and sensation are intact Psych: euthymic mood, full affect  EKG:  EKG is not ordered today. Personal review of the ekg ordered 03/14/17 shows atrial fibrillation, rate 78  Recent Labs: 02/11/2017: BUN 17; Creatinine, Ser 1.26; Hemoglobin 14.8; Potassium 4.3; Sodium 145; TSH 2.710    Lipid Panel     Component Value Date/Time   CHOL 146 10/20/2015  1651   TRIG 306 (H) 10/20/2015 1651   HDL 38 (L) 10/20/2015 1651   CHOLHDL 3.8 10/20/2015 1651   VLDL 61 (H) 10/20/2015 1651   LDLCALC 47 10/20/2015 1651     Wt Readings from Last 3 Encounters:  04/29/17 215 lb (97.5 kg)  04/01/17 210 lb (95.3 kg)  03/10/17 214 lb (97.1 kg)      Other studies Reviewed: Additional studies/ records that were reviewed today include: Myoview 02/18/17 - personally reviewed Review of the above records today demonstrates:  Low risk study, EF 55%  Echocardiogram 02/14/2017: Left ventricle cavity is normal in size. Normal global wall motion. Unable to evaluate diastolic function due to A. Fibrillation. Calculated EF 68%. Trace mitral regurgitation. Normal left atrial size  ASSESSMENT AND PLAN:  1.  Persistent atrial fibrillation/atrial flutter: He on Eliquis flecainide and metoprolol.  He is planned for atrial fibrillation ablation 05/06/17.  Risks and benefits were discussed.  Risks include bleeding, tamponade, heart block, stroke, damage to surrounding organs.  The patient understands these risks and is agreed to the procedure.  Due to his more frequent episodes of atrial fibrillation, will increase metoprolol to 75 mg twice a day.  This patients CHA2DS2-VASc Score and unadjusted Ischemic Stroke Rate (% per year) is equal to 0.2 % stroke rate/year from a score of 0  Above score calculated as 1 point each if present [CHF, HTN, DM, Vascular=MI/PAD/Aortic Plaque, Age if 65-74, or Male] Above score calculated as 2 points each if present [Age > 75, or Stroke/TIA/TE]   Current medicines are reviewed at length with the patient today.   The patient does not have concerns regarding his medicines.  The following changes were made today: Increase metoprolol   Labs/ tests ordered today include:  Orders Placed This Encounter  Procedures  . Basic Metabolic Panel (BMET)  . CBC w/Diff    Disposition:   FU with Will Camnitz 3 months  Signed, Will Meredith Leeds, MD  04/29/2017 4:08 PM     [address] [place] [address] [phone] (office) [phone] (fax)

## 2017-04-29 NOTE — Patient Instructions (Addendum)
Medication Instructions:  Your physician has recommended you make the following change in your medication:  1. CHANGE the way you take Metoprolol -- take 75 mg twice a day  Labwork: Pre procedure labs today: BMET & CBC w/ diff  Testing/Procedures: None ordered  Follow-Up: Your physician recommends that you schedule a follow-up appointment in: 4 weeks, after your procedure on 05/06/17, with Roderic Palau in the AFib clinic.  Your physician recommends that you schedule a follow-up appointment in: 3 months, after your procedure on 05/06/17, with Dr. Curt Bears.  * If you need a refill on your cardiac medications before your next appointment, please call your pharmacy.   *Please note that any paperwork needing to be filled out by the provider will need to be addressed at the front desk prior to seeing the provider. Please note that any FMLA, disability or other documents regarding health condition is subject to a $25.00 charge that must be received prior to completion of paperwork in the form of a money order or check.  Thank you for choosing [facility]!!   Trinidad Curet, RN [phone]

## 2017-04-30 LAB — BASIC METABOLIC PANEL
BUN/Creatinine Ratio: 12 (ref 9–20)
BUN: 16 mg/dL (ref 6–24)
CALCIUM: 9.1 mg/dL (ref 8.7–10.2)
CO2: 22 mmol/L (ref 20–29)
Chloride: 111 mmol/L — ABNORMAL HIGH (ref 96–106)
Creatinine, Ser: 1.31 mg/dL — ABNORMAL HIGH (ref 0.76–1.27)
GFR calc Af Amer: 69 mL/min/{1.73_m2} (ref >59)
GFR calc non Af Amer: 60 mL/min/{1.73_m2} (ref >59)
GLUCOSE: 82 mg/dL (ref 65–99)
POTASSIUM: 4.8 mmol/L (ref 3.5–5.2)
Sodium: 145 mmol/L — ABNORMAL HIGH (ref 134–144)

## 2017-04-30 LAB — CBC WITH DIFFERENTIAL/PLATELET
Basophils Absolute: 0.1 10*3/uL (ref 0.0–0.2)
Basos: 1 %
EOS (ABSOLUTE): 0.1 10*3/uL (ref 0.0–0.4)
Eos: 1 %
Hematocrit: 41.3 % (ref 37.5–51.0)
Hemoglobin: 13.9 g/dL (ref 13.0–17.7)
IMMATURE GRANS (ABS): 0 10*3/uL (ref 0.0–0.1)
IMMATURE GRANULOCYTES: 0 %
LYMPHS: 45 %
Lymphocytes Absolute: 2.6 10*3/uL (ref 0.7–3.1)
MCH: 34 pg — ABNORMAL HIGH (ref 26.6–33.0)
MCHC: 33.7 g/dL (ref 31.5–35.7)
MCV: 101 fL — ABNORMAL HIGH (ref 79–97)
MONOS ABS: 0.5 10*3/uL (ref 0.1–0.9)
Monocytes: 9 %
NEUTROS PCT: 44 %
Neutrophils Absolute: 2.5 10*3/uL (ref 1.4–7.0)
PLATELETS: 226 10*3/uL (ref 150–379)
RBC: 4.09 x10E6/uL — AB (ref 4.14–5.80)
RDW: 14.3 % (ref 12.3–15.4)
WBC: 5.7 10*3/uL (ref 3.4–10.8)

## 2017-05-02 ENCOUNTER — Ambulatory Visit (HOSPITAL_COMMUNITY): Payer: BC Managed Care – PPO

## 2017-05-02 ENCOUNTER — Encounter: Payer: Self-pay | Admitting: *Deleted

## 2017-05-02 ENCOUNTER — Ambulatory Visit (HOSPITAL_COMMUNITY)
Admission: RE | Admit: 2017-05-02 | Discharge: 2017-05-02 | Disposition: A | Discharge status: Home / Self Care | Payer: BC Managed Care – PPO | Source: Ambulatory Visit | Attending: Cardiology | Admitting: Cardiology

## 2017-05-02 DIAGNOSIS — I4819 Other persistent atrial fibrillation: Secondary | ICD-10-CM

## 2017-05-02 DIAGNOSIS — I481 Persistent atrial fibrillation: Secondary | ICD-10-CM | POA: Diagnosis present

## 2017-05-02 DIAGNOSIS — I4892 Unspecified atrial flutter: Secondary | ICD-10-CM | POA: Diagnosis not present

## 2017-05-02 MED ORDER — METOPROLOL TARTRATE 5 MG/5ML IV SOLN
5.0000 mg | Freq: Once | INTRAVENOUS | Status: AC
  Administered 2017-05-02: 5 mg via INTRAVENOUS
  Filled 2017-05-02: qty 5

## 2017-05-02 MED ORDER — IOPAMIDOL (ISOVUE-370) INJECTION 76%
80.0000 mL | Freq: Once | INTRAVENOUS | Status: AC | PRN
  Administered 2017-05-02: 100 mL via INTRAVENOUS

## 2017-05-02 MED ORDER — METOPROLOL TARTRATE 5 MG/5ML IV SOLN
INTRAVENOUS | Status: AC
  Administered 2017-05-02: 5 mg via INTRAVENOUS
  Filled 2017-05-02: qty 5

## 2017-05-06 ENCOUNTER — Ambulatory Visit (HOSPITAL_COMMUNITY)
Admission: RE | Admit: 2017-05-06 | Discharge: 2017-05-07 | Disposition: A | Discharge status: Home / Self Care | Payer: BC Managed Care – PPO | Source: Ambulatory Visit | Attending: Cardiology | Admitting: Cardiology

## 2017-05-06 ENCOUNTER — Other Ambulatory Visit: Payer: Self-pay

## 2017-05-06 ENCOUNTER — Ambulatory Visit (HOSPITAL_COMMUNITY)
Admission: RE | Disposition: A | Discharge status: Home / Self Care | Payer: Self-pay | Source: Ambulatory Visit | Attending: Cardiology

## 2017-05-06 ENCOUNTER — Ambulatory Visit (HOSPITAL_COMMUNITY): Payer: BC Managed Care – PPO | Admitting: Certified Registered Nurse Anesthetist

## 2017-05-06 ENCOUNTER — Encounter (HOSPITAL_COMMUNITY): Payer: Self-pay | Admitting: Certified Registered Nurse Anesthetist

## 2017-05-06 DIAGNOSIS — I481 Persistent atrial fibrillation: Secondary | ICD-10-CM | POA: Insufficient documentation

## 2017-05-06 DIAGNOSIS — Z7901 Long term (current) use of anticoagulants: Secondary | ICD-10-CM | POA: Insufficient documentation

## 2017-05-06 DIAGNOSIS — I483 Typical atrial flutter: Secondary | ICD-10-CM | POA: Diagnosis not present

## 2017-05-06 DIAGNOSIS — I480 Paroxysmal atrial fibrillation: Secondary | ICD-10-CM | POA: Insufficient documentation

## 2017-05-06 DIAGNOSIS — Z79899 Other long term (current) drug therapy: Secondary | ICD-10-CM | POA: Insufficient documentation

## 2017-05-06 DIAGNOSIS — Z87891 Personal history of nicotine dependence: Secondary | ICD-10-CM | POA: Insufficient documentation

## 2017-05-06 DIAGNOSIS — I4892 Unspecified atrial flutter: Secondary | ICD-10-CM | POA: Diagnosis not present

## 2017-05-06 HISTORY — DX: Unspecified atrial fibrillation: I48.91

## 2017-05-06 HISTORY — PX: ATRIAL FIBRILLATION ABLATION: EP1191

## 2017-05-06 LAB — POCT ACTIVATED CLOTTING TIME
ACTIVATED CLOTTING TIME: 301 s
ACTIVATED CLOTTING TIME: 307 s
Activated Clotting Time: 318 seconds
Activated Clotting Time: 334 seconds
Activated Clotting Time: 164 seconds

## 2017-05-06 SURGERY — ATRIAL FIBRILLATION ABLATION
Anesthesia: General

## 2017-05-06 MED ORDER — DEXTROSE 5 % IV SOLN
INTRAVENOUS | Status: DC | PRN
  Administered 2017-05-06: 20 ug/min via INTRAVENOUS

## 2017-05-06 MED ORDER — MEPERIDINE HCL 25 MG/ML IJ SOLN: 6.2500 mg | INTRAMUSCULAR | Status: DC | PRN

## 2017-05-06 MED ORDER — EPHEDRINE SULFATE 50 MG/ML IJ SOLN
INTRAMUSCULAR | Status: DC | PRN
  Administered 2017-05-06 (×2): 5 mg via INTRAVENOUS
  Administered 2017-05-06: 10 mg via INTRAVENOUS

## 2017-05-06 MED ORDER — HEPARIN (PORCINE) IN NACL 2-0.9 UNIT/ML-% IJ SOLN
INTRAMUSCULAR | Status: AC | PRN
  Administered 2017-05-06 (×5): 500 mL

## 2017-05-06 MED ORDER — SODIUM CHLORIDE 0.9 % IV SOLN: 250.0000 mL | INTRAVENOUS | Status: DC | PRN

## 2017-05-06 MED ORDER — FENTANYL CITRATE (PF) 100 MCG/2ML IJ SOLN
25.0000 ug | Freq: Once | INTRAMUSCULAR | Status: AC
  Administered 2017-05-06: 25 ug via INTRAVENOUS

## 2017-05-06 MED ORDER — FENTANYL CITRATE (PF) 100 MCG/2ML IJ SOLN
INTRAMUSCULAR | Status: AC
  Filled 2017-05-06: qty 2

## 2017-05-06 MED ORDER — FENTANYL CITRATE (PF) 250 MCG/5ML IJ SOLN
INTRAMUSCULAR | Status: AC
Start: 2017-05-06 — End: ?
  Filled 2017-05-06: qty 5

## 2017-05-06 MED ORDER — FLECAINIDE ACETATE 100 MG PO TABS
100.0000 mg | ORAL_TABLET | Freq: Two times a day (BID) | ORAL | Status: DC
  Administered 2017-05-06 – 2017-05-07 (×2): 100 mg via ORAL
  Filled 2017-05-06 (×2): qty 1

## 2017-05-06 MED ORDER — SUGAMMADEX SODIUM 200 MG/2ML IV SOLN
INTRAVENOUS | Status: DC | PRN
  Administered 2017-05-06: 200 mg via INTRAVENOUS

## 2017-05-06 MED ORDER — SODIUM CHLORIDE 0.9% FLUSH
3.0000 mL | Freq: Two times a day (BID) | INTRAVENOUS | Status: DC
  Administered 2017-05-06 – 2017-05-07 (×2): 3 mL via INTRAVENOUS

## 2017-05-06 MED ORDER — HEPARIN SODIUM (PORCINE) 1000 UNIT/ML IJ SOLN
INTRAMUSCULAR | Status: DC | PRN
  Administered 2017-05-06: 3000 [IU] via INTRAVENOUS
  Administered 2017-05-06: 2000 [IU] via INTRAVENOUS
  Administered 2017-05-06: 14000 [IU] via INTRAVENOUS
  Administered 2017-05-06: 4000 [IU] via INTRAVENOUS

## 2017-05-06 MED ORDER — DOBUTAMINE IN D5W 4-5 MG/ML-% IV SOLN
INTRAVENOUS | Status: AC
  Filled 2017-05-06: qty 250

## 2017-05-06 MED ORDER — ACETAMINOPHEN 325 MG PO TABS: 650.0000 mg | ORAL_TABLET | ORAL | Status: DC | PRN

## 2017-05-06 MED ORDER — ONDANSETRON HCL 4 MG/2ML IJ SOLN
4.0000 mg | Freq: Four times a day (QID) | INTRAMUSCULAR | Status: DC | PRN
  Administered 2017-05-06: 4 mg via INTRAVENOUS

## 2017-05-06 MED ORDER — PROTAMINE SULFATE 10 MG/ML IV SOLN
INTRAVENOUS | Status: DC | PRN
  Administered 2017-05-06: 40 mg via INTRAVENOUS

## 2017-05-06 MED ORDER — MIDAZOLAM HCL 5 MG/5ML IJ SOLN
INTRAMUSCULAR | Status: DC | PRN
  Administered 2017-05-06: 2 mg via INTRAVENOUS

## 2017-05-06 MED ORDER — HEPARIN (PORCINE) IN NACL 2-0.9 UNIT/ML-% IJ SOLN
INTRAMUSCULAR | Status: AC
  Filled 2017-05-06: qty 500

## 2017-05-06 MED ORDER — APIXABAN 5 MG PO TABS
5.0000 mg | ORAL_TABLET | Freq: Two times a day (BID) | ORAL | Status: DC
  Administered 2017-05-06 – 2017-05-07 (×2): 5 mg via ORAL
  Filled 2017-05-06 (×2): qty 1

## 2017-05-06 MED ORDER — FENTANYL CITRATE (PF) 100 MCG/2ML IJ SOLN: 25.0000 ug | INTRAMUSCULAR | Status: DC | PRN

## 2017-05-06 MED ORDER — ROCURONIUM BROMIDE 100 MG/10ML IV SOLN
INTRAVENOUS | Status: DC | PRN
  Administered 2017-05-06: 20 mg via INTRAVENOUS
  Administered 2017-05-06: 50 mg via INTRAVENOUS

## 2017-05-06 MED ORDER — BUPIVACAINE HCL (PF) 0.25 % IJ SOLN
INTRAMUSCULAR | Status: DC | PRN
  Administered 2017-05-06: 40 mL

## 2017-05-06 MED ORDER — LACTATED RINGERS IV SOLN
INTRAVENOUS | Status: DC | PRN
  Administered 2017-05-06 (×2): via INTRAVENOUS

## 2017-05-06 MED ORDER — DOBUTAMINE IN D5W 4-5 MG/ML-% IV SOLN
INTRAVENOUS | Status: DC | PRN
  Administered 2017-05-06: 20 ug/kg/min via INTRAVENOUS

## 2017-05-06 MED ORDER — PROPOFOL 10 MG/ML IV BOLUS
INTRAVENOUS | Status: DC | PRN
  Administered 2017-05-06: 20 mg via INTRAVENOUS
  Administered 2017-05-06: 150 mg via INTRAVENOUS

## 2017-05-06 MED ORDER — FENTANYL CITRATE (PF) 100 MCG/2ML IJ SOLN
INTRAMUSCULAR | Status: DC | PRN
  Administered 2017-05-06: 100 ug via INTRAVENOUS
  Administered 2017-05-06: 25 ug via INTRAVENOUS
  Administered 2017-05-06: 50 ug via INTRAVENOUS

## 2017-05-06 MED ORDER — BUPIVACAINE HCL (PF) 0.25 % IJ SOLN
INTRAMUSCULAR | Status: AC
  Filled 2017-05-06: qty 30

## 2017-05-06 MED ORDER — MIDAZOLAM HCL 2 MG/2ML IJ SOLN
INTRAMUSCULAR | Status: AC
  Filled 2017-05-06: qty 2

## 2017-05-06 MED ORDER — ONDANSETRON HCL 4 MG/2ML IJ SOLN
INTRAMUSCULAR | Status: AC
  Filled 2017-05-06: qty 2

## 2017-05-06 MED ORDER — ONDANSETRON HCL 4 MG/2ML IJ SOLN
INTRAMUSCULAR | Status: DC | PRN
  Administered 2017-05-06: 4 mg via INTRAVENOUS

## 2017-05-06 MED ORDER — HEPARIN SODIUM (PORCINE) 1000 UNIT/ML IJ SOLN
INTRAMUSCULAR | Status: DC | PRN
  Administered 2017-05-06: 1000 [IU] via INTRAVENOUS

## 2017-05-06 MED ORDER — LACTATED RINGERS IV SOLN: INTRAVENOUS | Status: DC

## 2017-05-06 MED ORDER — SODIUM CHLORIDE 0.9% FLUSH: 3.0000 mL | INTRAVENOUS | Status: DC | PRN

## 2017-05-06 MED ORDER — PHENYLEPHRINE HCL 10 MG/ML IJ SOLN
INTRAMUSCULAR | Status: DC | PRN
  Administered 2017-05-06 (×3): 80 ug via INTRAVENOUS
  Administered 2017-05-06: 120 ug via INTRAVENOUS

## 2017-05-06 MED ORDER — METOCLOPRAMIDE HCL 5 MG/ML IJ SOLN: 10.0000 mg | Freq: Once | INTRAMUSCULAR | Status: DC | PRN

## 2017-05-06 MED ORDER — HEPARIN SODIUM (PORCINE) 1000 UNIT/ML IJ SOLN
INTRAMUSCULAR | Status: AC
  Filled 2017-05-06: qty 1

## 2017-05-06 MED ORDER — LIDOCAINE HCL (CARDIAC) 20 MG/ML IV SOLN
INTRAVENOUS | Status: DC | PRN
  Administered 2017-05-06: 100 mg via INTRAVENOUS

## 2017-05-06 MED ORDER — METOPROLOL TARTRATE 50 MG PO TABS
75.0000 mg | ORAL_TABLET | Freq: Two times a day (BID) | ORAL | Status: DC
  Administered 2017-05-06 – 2017-05-07 (×2): 75 mg via ORAL
  Filled 2017-05-06 (×2): qty 1

## 2017-05-06 SURGICAL SUPPLY — 19 items
BAG SNAP BAND KOVER 36X36 (MISCELLANEOUS) ×2 IMPLANT
BLANKET WARM UNDERBOD FULL ACC (MISCELLANEOUS) ×3 IMPLANT
CATH MAPPNG PENTARAY F 2-6-2MM (CATHETERS) IMPLANT
CATH SMTCH THERMOCOOL SF DF (CATHETERS) ×2 IMPLANT
CATH SOUNDSTAR 3D IMAGING (CATHETERS) ×2 IMPLANT
CATH WEBSTER BI DIR CS D-F CRV (CATHETERS) ×2 IMPLANT
PACK EP LATEX FREE (CUSTOM PROCEDURE TRAY) ×3
PACK EP LF (CUSTOM PROCEDURE TRAY) ×1 IMPLANT
PAD DEFIB LIFELINK (PAD) ×3 IMPLANT
PATCH CARTO3 (PAD) ×2 IMPLANT
PENTARAY F 2-6-2MM (CATHETERS) ×3
SHEATH AVANTI 11CM 7FR (SHEATH) ×2 IMPLANT
SHEATH AVANTI 11CM 8FR (SHEATH) ×4 IMPLANT
SHEATH AVANTI 11CM 9FR (SHEATH) ×4 IMPLANT
SHEATH AVANTI 11F 11CM (SHEATH) ×2 IMPLANT
SHEATH BAYLIS SUREFLEX  M 8.5 (SHEATH) ×4
SHEATH BAYLIS SUREFLEX M 8.5 (SHEATH) IMPLANT
SHEATH BAYLIS TRANSSEPTAL 98CM (NEEDLE) ×2 IMPLANT
TUBING SMART ABLATE COOLFLOW (TUBING) ×2 IMPLANT

## 2017-05-06 NOTE — Progress Notes (Addendum)
Patient admitted to 6E24 from CCL via stretcher.  Bed in low position, wheels locked.  Patient denies chest pain/shortness of breath.  Telemetry monitor applied.  Patient oriented to environment, including call bell, TV, meal times, and hourly rounding (via Administrator, sports).  Patient and family aware of bedrest (HOB less than 30 degrees) for six hours.  Bilateral groin sites level zero.

## 2017-05-06 NOTE — Anesthesia Postprocedure Evaluation (Signed)
Anesthesia Post Note  Patient: [facility]  Procedure(s) Performed: ATRIAL FIBRILLATION ABLATION (N/A )     Patient location during evaluation: Cath Lab Anesthesia Type: General Level of consciousness: awake and alert, oriented and patient cooperative Pain management: pain level controlled Vital Signs Assessment: post-procedure vital signs reviewed and stable Respiratory status: nonlabored ventilation, spontaneous breathing, respiratory function stable and patient connected to nasal cannula oxygen Cardiovascular status: blood pressure returned to baseline and stable Postop Assessment: no apparent nausea or vomiting Anesthetic complications: no    Last Vitals:  Vitals:   05/06/17 1350 05/06/17 1430  BP: 99/68 108/68  Pulse: 63   Resp: (!) 8   Temp: (!) 36.4 C 36.7 C  SpO2: 97% 95%    Last Pain:  Vitals:   05/06/17 1430  TempSrc: Oral  PainSc:                  Semaja Lymon,E. Meloni Hinz

## 2017-05-06 NOTE — Anesthesia Procedure Notes (Signed)
Procedure Name: Intubation Date/Time: 05/06/2017 7:55 AM Performed by: Shirlyn Goltz, CRNA Pre-anesthesia Checklist: Patient identified, Emergency Drugs available, Suction available and Patient being monitored Patient Re-evaluated:Patient Re-evaluated prior to induction Oxygen Delivery Method: Circle system utilized Preoxygenation: Pre-oxygenation with 100% oxygen Induction Type: IV induction Ventilation: Mask ventilation without difficulty Laryngoscope Size: Mac, 4 and McGraph Grade View: Grade III Tube type: Oral Tube size: 7.5 mm Number of attempts: 2 Airway Equipment and Method: Stylet and Video-laryngoscopy Placement Confirmation: ETT inserted through vocal cords under direct vision,  positive ETCO2 and breath sounds checked- equal and bilateral Secured at: 22 cm Tube secured with: Tape Dental Injury: Teeth and Oropharynx as per pre-operative assessment

## 2017-05-06 NOTE — H&P (Signed)
Evan Page has presented today for surgery, with the diagnosis of atrial fibrillation.  The various methods of treatment have been discussed with the patient and family. After consideration of risks, benefits and other options for treatment, the patient has consented to  Procedure(s): Catheter ablation as a surgical intervention .  Risks include but not limited to bleeding, tamponade, heart block, stroke, damage to surrounding organs, among others. The patient's history has been reviewed, patient examined, no change in status, stable for surgery.  I have reviewed the patient's chart and labs.  Questions were answered to the patient's satisfaction.    Lonita Debes Curt Bears, MD 05/06/2017 7:09 AM

## 2017-05-06 NOTE — Anesthesia Preprocedure Evaluation (Signed)
Anesthesia Evaluation  Patient identified by MRN, date of birth, ID band Patient awake    Reviewed: Allergy & Precautions, NPO status , Patient's Chart, lab work & pertinent test results  Airway Mallampati: II  TM Distance: >3 FB Neck ROM: Full    Dental no notable dental hx.    Pulmonary neg pulmonary ROS, former smoker,    Pulmonary exam normal breath sounds clear to auscultation       Cardiovascular negative cardio ROS Normal cardiovascular exam Rhythm:Irregular Rate:Normal     Neuro/Psych negative neurological ROS  negative psych ROS   GI/Hepatic negative GI ROS, Neg liver ROS,   Endo/Other  negative endocrine ROS  Renal/GU negative Renal ROS  negative genitourinary   Musculoskeletal negative musculoskeletal ROS (+)   Abdominal   Peds negative pediatric ROS (+)  Hematology negative hematology ROS (+)   Anesthesia Other Findings   Reproductive/Obstetrics negative OB ROS                             Anesthesia Physical Anesthesia Plan  ASA: II  Anesthesia Plan: General   Post-op Pain Management:    Induction: Intravenous  PONV Risk Score and Plan: 2 and Ondansetron  Airway Management Planned: Oral ETT  Additional Equipment:   Intra-op Plan:   Post-operative Plan: Extubation in OR  Informed Consent: I have reviewed the patients History and Physical, chart, labs and discussed the procedure including the risks, benefits and alternatives for the proposed anesthesia with the patient or authorized representative who has indicated his/her understanding and acceptance.   Dental advisory given  Plan Discussed with: CRNA  Anesthesia Plan Comments:         Anesthesia Quick Evaluation

## 2017-05-06 NOTE — Progress Notes (Addendum)
Site area: Right groin a 9 french X 2 venous sheath was removed by Satira Sark RTR  Site Prior to Removal:  Level 0  Pressure Applied For 20 MINUTES    Bedrest Beginning at 1350p  Manual:   Yes.    Patient Status During Pull:  stable  Post Pull Groin Site:  Level 0  Post Pull Instructions Given:  Yes.    Post Pull Pulses Present:  Yes.    Dressing Applied:  Yes.    Comments:  VS remain stable

## 2017-05-06 NOTE — Progress Notes (Signed)
Site area: Left groin a 7, and 11 french venous sheath was removed  by Fuller Mandril RTR  Site Prior to Removal:  Level 0  Pressure Applied For 20 MINUTES    Bedrest Beginning at 1350p  Manual:   Yes.    Patient Status During Pull:  stable  Post Pull Groin Site:  Level 0  Post Pull Instructions Given:  Yes.    Post Pull Pulses Present:  Yes.    Dressing Applied:  Yes.    Comments:  VS remain stable

## 2017-05-06 NOTE — Transfer of Care (Signed)
Immediate Anesthesia Transfer of Care Note  Patient: Evan Page  Procedure(s) Performed: ATRIAL FIBRILLATION ABLATION (N/A )  Patient Location: Cath Lab  Anesthesia Type:General  Level of Consciousness: awake, alert , oriented and patient cooperative  Airway & Oxygen Therapy: Patient Spontanous Breathing and Patient connected to nasal cannula oxygen  Post-op Assessment: Report given to RN and Post -op Vital signs reviewed and stable will desat in mid 80s when snoring but increases to 96% when awake   Post vital signs: Reviewed and stable  Last Vitals:  Vitals Value Taken Time  BP    Temp    Pulse    Resp    SpO2      Last Pain:  Vitals:   05/06/17 0615  TempSrc:   PainSc: 0-No pain      Patients Stated Pain Goal: 3 (02/58/52 7782)  Complications: No apparent anesthesia complications

## 2017-05-06 NOTE — Progress Notes (Signed)
Patient c/o of seeing clear circles and some flushing.  Assess patient, no dizziness, headaches, lightheadedness, pain or N/V.  BP good.  Pulses normal.  Patient stated that this has not happen before.  Text MD for any recommendations.   I will keep monitoring the patient.

## 2017-05-06 NOTE — Discharge Instructions (Addendum)
Information on my medicine - ELIQUIS (apixaban)  Why was Eliquis prescribed for you? Eliquis was prescribed for you to reduce the risk of a blood clot forming that can cause a stroke if you have a medical condition called atrial fibrillation (a type of irregular heartbeat).  What do You need to know about Eliquis ? Take your Eliquis TWICE DAILY - one tablet in the morning and one tablet in the evening with or without food. If you have difficulty swallowing the tablet whole please discuss with your pharmacist how to take the medication safely.  Take Eliquis exactly as prescribed by your doctor and DO NOT stop taking Eliquis without talking to the doctor who prescribed the medication.  Stopping may increase your risk of developing a stroke.  Refill your prescription before you run out.    After discharge, you should have regular check-up appointments with your healthcare provider that is prescribing your Eliquis.  In the future your dose may need to be changed if your kidney function or weight changes by a significant amount or as you get older.  What do you do if you miss a dose? If you miss a dose, take it as soon as you remember on the same day and resume taking twice daily.  Do not take more than one dose of ELIQUIS at the same time to make up a missed dose.  Important Safety Information A possible side effect of Eliquis is bleeding. You should call your healthcare provider right away if you experience any of the following: ? Bleeding from an injury or your nose that does not stop. ? Unusual colored urine (red or dark brown) or unusual colored stools (red or black). ? Unusual bruising for unknown reasons. ? A serious fall or if you hit your head (even if there is no bleeding).  Some medicines may interact with Eliquis and might increase your risk of bleeding or clotting while on Eliquis. To help avoid this, consult your healthcare provider or pharmacist prior to using any new  prescription or non-prescription medications, including herbals, vitamins, non-steroidal anti-inflammatory drugs (NSAIDs) and supplements.  This website has more information on Eliquis (apixaban): http://www.eliquis.com/eliquis/home    Angiografa, cuidados posteriores (Angiogram, Care After) Estas indicaciones le proporcionan informacin acerca de cmo deber cuidarse despus del procedimiento. El mdico tambin podr darle instrucciones especficas. Comunquese con el mdico si tiene algn problema o tiene preguntas despus del procedimiento. [place] los medicamentos solamente como se lo haya indicado el [place] indicaciones de su mdico respecto de lo siguiente: ? Cuidado del Environmental consultant donde se insert Print production planner. ? Cambiar y Music therapist venda (vendaje).  Puede ducharse 24a 48horas despus del procedimiento o como se lo haya indicado el mdico.  No se d baos de inmersin, no practique natacin ni use el jacuzzi hasta que el mdico lo apruebe.  Revise diariamente el lugar donde se insert el catter. Est atento a lo siguiente: ? Dolor, hinchazn o enrojecimiento. ? Lquido, sangre o pus.  No se aplique talcos ni lociones en el lugar.  No levante ningn objeto que pese ms de 10libras (4,5kg) durante 5das o como se lo haya indicado el mdico.  Pregunte a su mdico cundo podr hacer lo siguiente: ? Regresar a la escuela o al [employer]. ? Realizar actividades fsicas o practicar deportes. ? Tener [employer].  No conduzca ni opere maquinaria pesada durante 24horas o como se lo haya indicado el mdico.  Pida a alguien que  lo acompae durante las primeras 24horas despus del procedimiento.  Concurra a todas las visitas de control como se lo haya indicado el mdico. Esto es importante.  SOLICITE AYUDA SI:  Tiene fiebre.  Tiene escalofros.  Aumenta el sangrado en el lugar donde se insert el catter. Haga presin en la  zona.  Tiene enrojecimiento, hinchazn o siente calor en el lugar donde se insert el catter.  Presenta un lquido o pus que sale de la zona.  SOLICITE AYUDA DE INMEDIATO SI:  Siente mucho dolor en el lugar donde se insert el catter.  El Environmental consultant de insercin del catter est sangrando, y el sangrado no se detiene despus de 27minutos de Oceanographer una presin constante.  La zona que est cerca o a poca distancia del lugar de insercin del catter se pone plida, fra, o siente hormigueo o adormecimiento.  Esta informacin no tiene Marine scientist el consejo del mdico. Asegrese de hacerle al mdico cualquier pregunta que tenga. Document Released: 05/15/2012 Document Revised: 02/08/2014 Document Reviewed: 06/21/2012 Elsevier Interactive Patient Education  2017 [employer].

## 2017-05-07 ENCOUNTER — Encounter (HOSPITAL_COMMUNITY): Payer: Self-pay | Admitting: Nurse Practitioner

## 2017-05-07 DIAGNOSIS — I480 Paroxysmal atrial fibrillation: Secondary | ICD-10-CM | POA: Diagnosis not present

## 2017-05-07 DIAGNOSIS — Z7901 Long term (current) use of anticoagulants: Secondary | ICD-10-CM | POA: Diagnosis not present

## 2017-05-07 DIAGNOSIS — I481 Persistent atrial fibrillation: Secondary | ICD-10-CM | POA: Diagnosis not present

## 2017-05-07 DIAGNOSIS — I4892 Unspecified atrial flutter: Secondary | ICD-10-CM | POA: Diagnosis not present

## 2017-05-07 NOTE — Discharge Summary (Addendum)
Discharge Summary    Patient ID: Evan Page,  MRN: [id number], DOB/AGE: 1960/11/25 57 y.o.  Admit date: 05/06/2017 Discharge date: 05/07/2017  Primary Care Provider: Tereasa Coop Primary Cardiologist: Christen Butter, MD / Elliot Cousin, MD (EP)  Discharge Diagnoses    Principal Problem:   Paroxysmal A-fib [facility])  *s/p catheter ablation this admission  Allergies No Known Allergies  Diagnostic Studies/Procedures    4.5.2019 1. Comprehensive electrophysiologic study. 2. Coronary sinus pacing and recording. 3. Three-dimensional mapping of atrial fibrillation with additional mapping and ablation of a second discrete focus (atrial flutter) 4. Ablation of atrial fibrillation with additional mapping and ablation of a second discrete focus (atrial flutter) 5. Intracardiac echocardiography. 6. Transseptal puncture of an intact septum. 7. Arrhythmia induction with pacing with isuprel infusion _____________   History of Present Illness     57 year old male with a history of paroxysmal and persistent atrial fibrillation and flutter status post prior cardioversion in February 2019.  He has been maintained on flecainide and Eliquis therapy and due to frequent symptomatic paroxysms, he was referred to electrophysiology in March of this year and decision was made to pursue catheter ablation.  Hospital Course     Consultants: None  Evan Page presented to the [facility] electrophysiology laboratory on May 06, 2017 and underwent electrophysiologic study and subsequent catheter ablation of atrial fibrillation.  Patient tolerated procedure well and has had no recurrence of atrial fibrillation post procedure.  He has been ambulating without difficulty and Solina Heron be discharged home today in good condition. _____________ Physical Exam   Discharge Vitals Blood pressure 121/69, pulse 61, temperature 98.2 F (36.8 C), temperature source Oral, resp. rate 20, height 5\' 8"  (1.727  m), weight 217 lb 12.8 oz (98.8 kg), SpO2 96 %.  Filed Weights   05/06/17 0549 05/07/17 0504  Weight: 210 lb (95.3 kg) 217 lb 12.8 oz (98.8 kg)    GEN: Well nourished, well developed, in no acute distress.  HEENT: Grossly normal.  Neck: Supple, no JVD, carotid bruits, or masses. Cardiac: RRR, no murmurs, rubs, or gallops. No clubbing, cyanosis, edema.  Radials/DP/PT 2+ and equal bilaterally.  Respiratory:  Respirations regular and unlabored, clear to auscultation bilaterally. GI: Soft, nontender, nondistended, BS + x 4. MS: no deformity or atrophy. Skin: warm and dry, no rash. Neuro:  Strength and sensation are intact. Psych: AAOx3.  Normal affect.  Labs & Radiologic Studies   None _____________   Ct Cardiac Morph/pulm Vein W/cm&w/o Ca Score  Addendum Date: 05/02/2017   ADDENDUM REPORT: 05/02/2017 17:20 CLINICAL DATA:  Atrial fibrillation scheduled for an ablation. EXAM: Cardiac CT/CTA TECHNIQUE: The patient was scanned on a Siemens Somatom scanner. FINDINGS: A 120 kV prospective scan was triggered in the descending thoracic aorta at 111 HU's. Gantry rotation speed was 280 msecs and collimation was .9 mm. No beta blockade and no NTG was given. The 3D data set was reconstructed in 5% intervals of the 60-80 % of the R-R cycle. Diastolic phases were analyzed on a dedicated work station using MPR, MIP and VRT modes. The patient received 80 cc of contrast. There is normal pulmonary vein drainage into the left atrium (2 on the right and 2 on the left) with ostial measurements as follows: RUPV: 20 x 15 mm RLPV: 23 x 21 mm LUPV: 18 x 11 mm LLPV: 19 x 16 mm The left atrial appendage is medium size with chicken wing morphology and one lobe. There is no thrombus in the  left atrial appendage. The esophagus runs in the left atrial midline and is not in the proximity to any of the pulmonary veins. Aorta:  Normal caliber.  No dissection or calcifications. Aortic Valve:  Trileaflet.  No calcifications.  Coronary Arteries: Normal coronary origin. Right dominance. The study was performed without use of NTG and insufficient for plaque evaluation. IMPRESSION: 1. There is normal pulmonary vein drainage into the left atrium. 2. The left atrial appendage is medium size with chicken wing morphology and one lobe. There is no thrombus in the left atrial appendage. 3. The esophagus runs in the left atrial midline and is not in the proximity to any of the pulmonary veins. 4. Coronary Arteries: Normal coronary origin. Right dominance. The study was performed without use of NTG and insufficient for plaque evaluation. Calcium score is 0. Electronically Signed   By: Ena Dawley   On: 05/02/2017 17:20   Result Date: 05/02/2017 EXAM: OVER-READ INTERPRETATION  CT CHEST The following report is an over-read performed by radiologist Dr. Suzy Bouchard of [facility], [place] on 05/02/2017. This over-read does not include interpretation of cardiac or coronary anatomy or pathology. The CT cardiac morphology interpretation by the cardiologist is attached. COMPARISON:  None. FINDINGS: Limited view of the lung parenchyma demonstrates no suspicious nodularity. Airways are normal. Limited view of the mediastinum demonstrates no adenopathy. Esophagus normal. Limited view of the upper abdomen unremarkable. Limited view of the skeleton and chest wall is unremarkable. IMPRESSION: No significant extracardiac findings Electronically Signed: By: Suzy Bouchard M.D. On: 05/02/2017 15:22   Disposition   Pt is being discharged home today in good condition.  Follow-up Plans & Appointments   Follow-up Information    Sherran Needs, NP Follow up on 06/01/2017.   Specialties:  Nurse Practitioner, Cardiology Why:  3:30 PM Contact information: [place] [address] [phone]        Constance Haw, MD Follow up on 08/29/2017.   Specialty:  Cardiology Why:  3:45 PM Contact information: [place] [address] [phone]            Discharge Medications   Allergies as of 05/07/2017   No Known Allergies     Medication List    TAKE these medications   ELIQUIS 5 MG Tabs tablet Generic drug:  apixaban Take 5 mg by mouth 2 (two) times daily.   flecainide 100 MG tablet Commonly known as:  TAMBOCOR Take 100 mg by mouth 2 (two) times daily.   metoprolol tartrate 50 MG tablet Commonly known as:  LOPRESSOR Take 1.5 tablets (75 mg total) by mouth 2 (two) times daily.         Outstanding Labs/Studies   None  Duration of Discharge Encounter   Greater than 30 minutes including physician time.  Signed, Murray Hodgkins NP 05/07/2017, 9:52 AM     I have seen and examined this patient with Murray Hodgkins.  Agree with above, note added to reflect my findings.  On exam, the rhythm, no murmurs, lungs clear.  Patient had AF ablation for typical atrial flutter and paroxysmal atrial fibrillation. Tolerated the procedure well without complaint this morning. Plan for discharge today with follow up in EP clinic.  Zaveon Gillen M. Marcin Holte MD 05/07/2017 10:21 AM

## 2017-05-07 NOTE — Progress Notes (Signed)
Requested to come to the bed side as patient had a transient event of seeing clear circles associated with some flushing in his face. He denied any other symptoms including focal weakness or numbness. Neuro exam shows no occult deficits. Will just monitor for now. Vitals remain stable.    [facility] fellow.

## 2017-05-07 NOTE — Progress Notes (Signed)
Patient discharge order received.  Discharge instructions reviewed with patient using stratus interpretation services.  Instructions given to patient.

## 2017-05-09 ENCOUNTER — Encounter (HOSPITAL_COMMUNITY): Payer: Self-pay | Admitting: Cardiology

## 2017-05-09 MED FILL — Heparin Sodium (Porcine) 2 Unit/ML in Sodium Chloride 0.9%: INTRAMUSCULAR | Qty: 2500 | Status: AC

## 2017-05-23 ENCOUNTER — Encounter: Payer: Self-pay | Admitting: Family Medicine

## 2017-05-23 ENCOUNTER — Other Ambulatory Visit: Payer: Self-pay

## 2017-05-23 ENCOUNTER — Emergency Department (HOSPITAL_COMMUNITY): Payer: BC Managed Care – PPO

## 2017-05-23 ENCOUNTER — Ambulatory Visit (INDEPENDENT_AMBULATORY_CARE_PROVIDER_SITE_OTHER): Payer: BC Managed Care – PPO | Admitting: Family Medicine

## 2017-05-23 ENCOUNTER — Observation Stay (HOSPITAL_COMMUNITY)
Admission: EM | Admit: 2017-05-23 | Discharge: 2017-05-24 | Disposition: A | Discharge status: Home / Self Care | Payer: BC Managed Care – PPO | Attending: Cardiology | Admitting: Cardiology

## 2017-05-23 ENCOUNTER — Encounter (HOSPITAL_COMMUNITY): Payer: Self-pay

## 2017-05-23 VITALS — BP 117/71 | HR 102 | Temp 98.4°F | Resp 20

## 2017-05-23 DIAGNOSIS — R0609 Other forms of dyspnea: Secondary | ICD-10-CM

## 2017-05-23 DIAGNOSIS — I480 Paroxysmal atrial fibrillation: Secondary | ICD-10-CM | POA: Diagnosis not present

## 2017-05-23 DIAGNOSIS — G4733 Obstructive sleep apnea (adult) (pediatric): Secondary | ICD-10-CM | POA: Insufficient documentation

## 2017-05-23 DIAGNOSIS — Z87891 Personal history of nicotine dependence: Secondary | ICD-10-CM | POA: Diagnosis not present

## 2017-05-23 DIAGNOSIS — R0789 Other chest pain: Secondary | ICD-10-CM | POA: Diagnosis not present

## 2017-05-23 DIAGNOSIS — I4891 Unspecified atrial fibrillation: Secondary | ICD-10-CM

## 2017-05-23 DIAGNOSIS — R61 Generalized hyperhidrosis: Secondary | ICD-10-CM

## 2017-05-23 DIAGNOSIS — E669 Obesity, unspecified: Secondary | ICD-10-CM | POA: Insufficient documentation

## 2017-05-23 DIAGNOSIS — Z6832 Body mass index (BMI) 32.0-32.9, adult: Secondary | ICD-10-CM | POA: Insufficient documentation

## 2017-05-23 DIAGNOSIS — I444 Left anterior fascicular block: Secondary | ICD-10-CM | POA: Insufficient documentation

## 2017-05-23 DIAGNOSIS — R079 Chest pain, unspecified: Secondary | ICD-10-CM

## 2017-05-23 DIAGNOSIS — I484 Atypical atrial flutter: Secondary | ICD-10-CM | POA: Insufficient documentation

## 2017-05-23 DIAGNOSIS — Z7901 Long term (current) use of anticoagulants: Secondary | ICD-10-CM | POA: Insufficient documentation

## 2017-05-23 DIAGNOSIS — E781 Pure hyperglyceridemia: Secondary | ICD-10-CM | POA: Insufficient documentation

## 2017-05-23 HISTORY — DX: Gastro-esophageal reflux disease without esophagitis: K21.9

## 2017-05-23 HISTORY — DX: Sleep apnea, unspecified: G47.30

## 2017-05-23 LAB — I-STAT TROPONIN, ED: Troponin i, poc: 0.01 ng/mL (ref 0.00–0.08)

## 2017-05-23 LAB — COMPREHENSIVE METABOLIC PANEL
ALBUMIN: 3.5 g/dL (ref 3.5–5.0)
ALT: 32 U/L (ref 17–63)
ANION GAP: 8 (ref 5–15)
AST: 25 U/L (ref 15–41)
Alkaline Phosphatase: 43 U/L (ref 38–126)
BILIRUBIN TOTAL: 0.5 mg/dL (ref 0.3–1.2)
BUN: 17 mg/dL (ref 6–20)
CO2: 23 mmol/L (ref 22–32)
Calcium: 9 mg/dL (ref 8.9–10.3)
Chloride: 112 mmol/L — ABNORMAL HIGH (ref 101–111)
Creatinine, Ser: 1.42 mg/dL — ABNORMAL HIGH (ref 0.61–1.24)
GFR calc Af Amer: >60 mL/min (ref >60)
GFR calc non Af Amer: 53 mL/min — ABNORMAL LOW (ref >60)
GLUCOSE: 91 mg/dL (ref 65–99)
POTASSIUM: 4.5 mmol/L (ref 3.5–5.1)
SODIUM: 143 mmol/L (ref 135–145)
TOTAL PROTEIN: 6.8 g/dL (ref 6.5–8.1)

## 2017-05-23 LAB — CBC
HCT: 42.9 % (ref 39.0–52.0)
Hemoglobin: 13.9 g/dL (ref 13.0–17.0)
MCH: 33.6 pg (ref 26.0–34.0)
MCHC: 32.4 g/dL (ref 30.0–36.0)
MCV: 103.6 fL — ABNORMAL HIGH (ref 78.0–100.0)
PLATELETS: 279 10*3/uL (ref 150–400)
RBC: 4.14 MIL/uL — ABNORMAL LOW (ref 4.22–5.81)
RDW: 14.6 % (ref 11.5–15.5)
WBC: 8.1 10*3/uL (ref 4.0–10.5)

## 2017-05-23 LAB — MAGNESIUM: MAGNESIUM: 2.2 mg/dL (ref 1.7–2.4)

## 2017-05-23 MED ORDER — NITROGLYCERIN 0.4 MG SL SUBL: 0.4000 mg | SUBLINGUAL_TABLET | SUBLINGUAL | Status: DC | PRN

## 2017-05-23 MED ORDER — ASPIRIN 300 MG RE SUPP: 300.0000 mg | RECTAL | Status: DC

## 2017-05-23 MED ORDER — SODIUM CHLORIDE 0.9 % IV BOLUS
1000.0000 mL | Freq: Once | INTRAVENOUS | Status: DC
  Administered 2017-05-23: 1000 mL via INTRAVENOUS

## 2017-05-23 MED ORDER — FENTANYL CITRATE (PF) 100 MCG/2ML IJ SOLN
50.0000 ug | Freq: Once | INTRAMUSCULAR | Status: DC
  Filled 2017-05-23: qty 2

## 2017-05-23 MED ORDER — SODIUM CHLORIDE 0.9 % WEIGHT BASED INFUSION
3.0000 mL/kg/h | INTRAVENOUS | Status: AC
  Administered 2017-05-24: 3 mL/kg/h via INTRAVENOUS

## 2017-05-23 MED ORDER — PROPOFOL 10 MG/ML IV BOLUS
0.5000 mg/kg | Freq: Once | INTRAVENOUS | Status: AC
  Administered 2017-05-23: 50 mg via INTRAVENOUS
  Filled 2017-05-23: qty 20

## 2017-05-23 MED ORDER — SODIUM CHLORIDE 0.9% FLUSH
3.0000 mL | Freq: Two times a day (BID) | INTRAVENOUS | Status: DC
  Administered 2017-05-23: 3 mL via INTRAVENOUS

## 2017-05-23 MED ORDER — PROPOFOL 10 MG/ML IV BOLUS
INTRAVENOUS | Status: AC | PRN
  Administered 2017-05-23: 20 mg via INTRAVENOUS

## 2017-05-23 MED ORDER — ONDANSETRON HCL 4 MG/2ML IJ SOLN: 4.0000 mg | Freq: Four times a day (QID) | INTRAMUSCULAR | Status: DC | PRN

## 2017-05-23 MED ORDER — ATORVASTATIN CALCIUM 20 MG PO TABS
20.0000 mg | ORAL_TABLET | Freq: Every day | ORAL | Status: DC
  Administered 2017-05-23: 20 mg via ORAL
  Filled 2017-05-23 (×2): qty 1

## 2017-05-23 MED ORDER — SODIUM CHLORIDE 0.9 % WEIGHT BASED INFUSION
1.0000 mL/kg/h | INTRAVENOUS | Status: DC
  Administered 2017-05-24: 1 mL/kg/h via INTRAVENOUS

## 2017-05-23 MED ORDER — ACETAMINOPHEN 325 MG PO TABS: 650.0000 mg | ORAL_TABLET | ORAL | Status: DC | PRN

## 2017-05-23 MED ORDER — SODIUM CHLORIDE 0.9% FLUSH: 3.0000 mL | INTRAVENOUS | Status: DC | PRN

## 2017-05-23 MED ORDER — METOPROLOL TARTRATE 25 MG PO TABS
25.0000 mg | ORAL_TABLET | Freq: Two times a day (BID) | ORAL | Status: DC
  Administered 2017-05-23 – 2017-05-24 (×2): 25 mg via ORAL
  Filled 2017-05-23 (×2): qty 1

## 2017-05-23 MED ORDER — ASPIRIN 81 MG PO CHEW
81.0000 mg | CHEWABLE_TABLET | ORAL | Status: AC
  Administered 2017-05-24: 81 mg via ORAL
  Filled 2017-05-23: qty 1

## 2017-05-23 MED ORDER — MIDAZOLAM HCL 2 MG/2ML IJ SOLN: 4.0000 mg | Freq: Once | INTRAMUSCULAR | Status: DC

## 2017-05-23 MED ORDER — ASPIRIN 81 MG PO CHEW: 81.0000 mg | CHEWABLE_TABLET | ORAL | Status: DC

## 2017-05-23 MED ORDER — FLECAINIDE ACETATE 50 MG PO TABS
50.0000 mg | ORAL_TABLET | Freq: Two times a day (BID) | ORAL | Status: DC
  Administered 2017-05-24: 50 mg via ORAL
  Filled 2017-05-23 (×3): qty 1

## 2017-05-23 MED ORDER — SODIUM CHLORIDE 0.9 % IV SOLN: 250.0000 mL | INTRAVENOUS | Status: DC | PRN

## 2017-05-23 MED ORDER — MIDAZOLAM HCL 5 MG/ML IJ SOLN: 4.0000 mg | Freq: Once | INTRAMUSCULAR | Status: DC

## 2017-05-23 MED ORDER — ASPIRIN EC 81 MG PO TBEC: 81.0000 mg | DELAYED_RELEASE_TABLET | Freq: Every day | ORAL | Status: DC

## 2017-05-23 MED ORDER — APIXABAN 5 MG PO TABS
5.0000 mg | ORAL_TABLET | Freq: Two times a day (BID) | ORAL | Status: DC
  Administered 2017-05-23: 5 mg via ORAL
  Filled 2017-05-23 (×2): qty 1

## 2017-05-23 NOTE — H&P (Signed)
[facility] Evan Page is an 57 y.o. male.   Chief Complaint:  Chest pain HPI:  Patient with paroxysmal atrial fibrillation, who was undergone atrial fibrillation ablation on 05/06/2017 by Dr. Curt Bears, severe obstructive sleep apnea diagnosed on 04/21/2017, presently on CPAP, no evidence of ischemia by nuclear stress testing in January 2019 with normal LVEF by echocardiogram, had been doing well until this morning started having chest discomfort with radiation to his neck and also to his left arm.  He also felt some tightness in the neck which he feels while he goes into atrial fibrillation.  EMS was activated, he received sublingual nitroglycerin with immediate relief of chest pain and left arm discomfort and also neck discomfort.  He is presently doing well except for marked dyspnea even on minimal activity.  He also states that his fatigue has continued in spite of atrial fibrillation ablation.  I was called to see the patient due to nitrate responsive chest pain.  Past Medical History:  Diagnosis Date  . Atrial fibrillation ([place])    a. 04/2017 s/p PVI.  Marland Kitchen Cataract    left     Past Surgical History:  Procedure Laterality Date  . ATRIAL FIBRILLATION ABLATION N/A 05/06/2017   Procedure: ATRIAL FIBRILLATION ABLATION;  Surgeon: Constance Haw, MD;  Location: [facility] LAB;  Service: Cardiovascular;  Laterality: N/A;  . CARDIOVERSION N/A 03/04/2017   Procedure: CARDIOVERSION;  Surgeon: Adrian Prows, MD;  Location[facility] ENDOSCOPY;  Service: Cardiovascular;  Laterality: N/A;  . wisdomteeth extraction     3 removed - 1 remains    Family History  Problem Relation Age of Onset  . Diabetes Mother   . Hyperlipidemia Mother   . Diabetes Father   . Hyperlipidemia Father   . Esophageal cancer Father   . Drug abuse Sister   . Hyperlipidemia Maternal Grandmother   . Colon cancer Neg Hx   . Pancreatic cancer Neg Hx   . Prostate cancer Neg Hx   . Rectal cancer Neg Hx   . Stomach cancer Neg  Hx    Social History:  reports that he has quit smoking. He has never used smokeless tobacco. He reports that he drinks about 1.2 - 1.8 oz of alcohol per week. He reports that he does not use drugs.  Allergies: No Known Allergies  Results for orders placed or performed during the hospital encounter of 05/23/17 (from the past 48 hour(s))  CBC     Status: Abnormal   Collection Time: 05/23/17  4:35 PM  Result Value Ref Range   WBC 8.1 4.0 - 10.5 K/uL   RBC 4.14 (L) 4.22 - 5.81 MIL/uL   Hemoglobin 13.9 13.0 - 17.0 g/dL   HCT 42.9 39.0 - 52.0 %   MCV 103.6 (H) 78.0 - 100.0 fL   MCH 33.6 26.0 - 34.0 pg   MCHC 32.4 30.0 - 36.0 g/dL   RDW 14.6 11.5 - 15.5 %   Platelets 279 150 - 400 K/uL    Comment: Performed at [facility] [address], [place], [place] [address]  Comprehensive metabolic panel     Status: Abnormal   Collection Time: 05/23/17  4:35 PM  Result Value Ref Range   Sodium 143 135 - 145 mmol/L   Potassium 4.5 3.5 - 5.1 mmol/L   Chloride 112 (H) 101 - 111 mmol/L   CO2 23 22 - 32 mmol/L   Glucose, Bld 91 65 - 99 mg/dL   BUN 17 6 - 20  mg/dL   Creatinine, Ser 1.42 (H) 0.61 - 1.24 mg/dL   Calcium 9.0 8.9 - 10.3 mg/dL   Total Protein 6.8 6.5 - 8.1 g/dL   Albumin 3.5 3.5 - 5.0 g/dL   AST 25 15 - 41 U/L   ALT 32 17 - 63 U/L   Alkaline Phosphatase 43 38 - 126 U/L   Total Bilirubin 0.5 0.3 - 1.2 mg/dL   GFR calc non Af Amer 53 (L) >60 mL/min   GFR calc Af Amer >60 >60 mL/min    Comment: (NOTE) The eGFR has been calculated using the CKD EPI equation. This calculation has not been validated in all clinical situations. eGFR's persistently <60 mL/min signify possible Chronic Kidney Disease.    Anion gap 8 5 - 15    Comment: Performed at [facility] [address], [place], [place] [address]  I-stat troponin, ED     Status: None   Collection Time: 05/23/17  6:08 PM  Result Value Ref Range   Troponin i, poc 0.01 0.00 - 0.08 ng/mL   Comment 3             Comment: Due to the release kinetics of cTnI, a negative result within the first hours of the onset of symptoms does not rule out myocardial infarction with certainty. If myocardial infarction is still suspected, repeat the test at appropriate intervals.    Lipid Panel     Component Value Date/Time   CHOL 146 10/20/2015 1651   TRIG 306 (H) 10/20/2015 1651   HDL 38 (L) 10/20/2015 1651   CHOLHDL 3.8 10/20/2015 1651   VLDL 61 (H) 10/20/2015 1651   LDLCALC 47 10/20/2015 1651    Dg Chest Port 1 View  Result Date: 05/23/2017 CLINICAL DATA:  Atrial flutter. EXAM: PORTABLE CHEST 1 VIEW COMPARISON:  Chest x-ray dated February 11, 2017. FINDINGS: The heart size and mediastinal contours are within normal limits. Normal pulmonary vascularity. Persistent low lung volumes. No focal consolidation, pleural effusion, or pneumothorax. No acute osseous abnormality. IMPRESSION: Low lung volumes.  No active disease. Electronically Signed   By: Titus Dubin M.D.   On: 05/23/2017 15:19    Review of Systems  Constitutional: Positive for malaise/fatigue (since this morning).  HENT: Negative.   Eyes: Negative.   Respiratory: Positive for shortness of breath. Negative for hemoptysis, sputum production and wheezing.   Cardiovascular: Positive for chest pain (with radiation to left arm and neck) and palpitations. Negative for orthopnea, claudication, leg swelling and PND.  Gastrointestinal: Negative.   Genitourinary: Negative.   Musculoskeletal: Negative.   Neurological: Positive for dizziness. Negative for tingling, tremors, weakness and headaches.  Endo/Heme/Allergies: Negative.   Psychiatric/Behavioral: Negative.   All other systems reviewed and are negative.   Blood pressure 107/73, pulse 89, temperature 98.6 F (37 C), temperature source Oral, resp. rate 16, height _0  (1.727 m), weight 98.4 kg (217 lb), SpO2 97 %. Body mass index is 32.99 kg/m.  Physical Exam  Constitutional: He is oriented  to person, place, and time. He appears well-developed and well-nourished. No distress.  HENT:  Head: Normocephalic.  Eyes: Conjunctivae are normal.  Neck: Normal range of motion. Neck supple.  Cardiovascular: Intact distal pulses. Exam reveals no gallop and no friction rub.  No murmur heard. S1 is variable, S2 is normal.  No gallop appreciated.  Respiratory: Effort normal and breath sounds normal.  GI: Soft. Bowel sounds are normal.  Musculoskeletal: Normal range of motion.  Neurological: He is alert  and oriented to person, place, and time.  Skin: Skin is warm and dry. He is not diaphoretic.  Psychiatric: He has a normal mood and affect.    EKG 05/23/2017: Atypical atrial flutter with controlled ventricular response at rate of 96 bpm, left axis deviation, left anterior fascicular block.  Nonspecific T abnormality.  Normal QT interval.  Compared to 05/07/2017, atypical atrial flutter new.  Echocardiogram 02/14/2017: Left ventricle cavity is normal in size. Normal global wall motion. Unable to evaluate diastolic function due to A. Fibrillation. Calculated EF 68%. Trace mitral regurgitation.  Exercise sestamibi stress test 02/18/2017: 1. Resting EKG demonstrates normal sinus rhythm with frequent PACs, early R-wave progression in the anterior lead, cannot exclude posterior infarct old. Stress EKG was negative for myocardial ischemia. Patient went into atrial fibrillation with rapid ventricular response at peak exercise and remained in atrial fibrillation at the end of the study.Patient exercised on Bruce protocol for 7:48 minutes and achieved 9.79 METS. Stress test terminated due to 108% MPHR achieved (Target HR >85%) and fatigue. Normal blood pressure response. 2. This is a normal myocardial perfusion imaging study with a fixed defect suggesting an area of soft tissue attenuation in the inferior wall. Gated SPECT images reveal normal myocardial thickening and wall motion. The left ventricular  ejection fraction was calculated to be 55%. This is a low risk study.  Current Meds  Medication Sig  . apixaban (ELIQUIS) 5 MG TABS tablet Take 5 mg by mouth 2 (two) times daily.  . flecainide (TAMBOCOR) 100 MG tablet Take 50 mg by mouth 2 (two) times daily.   . metoprolol tartrate (LOPRESSOR) 25 MG tablet Take 25 mg by mouth 2 (two) times daily.   Assessment/Plan 1.  Chest pain with radiation to the left arm and to his neck which was immediately relieved with sublingual nitroglycerin with immediate relief of symptoms, cannot exclude angina pectoris. 2.  Paroxysmal atrial fibrillation S/P A. fib ablation on 05/06/2017 by Dr. Curt Bears [id number]: Risk Score  1,  Yearly risk of stroke  1.3.  3.  Shortness of breath and dyspnea on exertion, minimal exertion brings on dyspnea, symptoms have not improved in spite of maintaining sinus rhythm after ablation. 4.  Severe obstructive sleep apnea on CPAP. 5.  Mild obesity 6.  10-pack-year history of smoking, quit 1998. 7.  Hypertriglyceridemia.  Recommendation: Patient symptoms of chest pain are concerning as he had complete relief with sublingual nitroglycerin.  Although he has had a negative nuclear stress test coronary artery disease need to be excluded.  Patient and his wife state that he is markedly fatigued and markedly dyspneic even doing minimal activity in spite of maintaining sinus rhythm and are concerned about this.  He has no history of hypertension, diabetes but has hypertriglyceridemia which is probably related to his diet.  He does have a 10-pack-year history of tobacco use.  Best option would be to proceed with direct-current cardioversion as he is on anticoagulation and admit him for observation and consider cardiac catheterization to definitively exclude coronary artery disease.  No clinical evidence of congestive heart failure or right ventricular failure.  I have discussed with him regarding cardioversion risk and they are willing  to proceed.   Adrian Prows, MD 05/23/2017, 7:53 PM [facility]. [place] Pager: [phone] Office: [phone] If no answer: Cell:  [phone]

## 2017-05-23 NOTE — CV Procedure (Signed)
Direct current cardioversion:  Indication symptomatic A. Fibrillation/atypical atrial flutter.  Procedure: Using 60 mg of IV Propofol  for achieving deep sedation, synchronized direct current cardioversion performed. Patient was delivered with 150 Joules of electricity X 1 with success to NSR. Patient tolerated the procedure well. No immediate complication noted.   I have discussed the findings with his wife.  They both are aware for possible need for coronary angiography in the morning.  All risk benefits have been explained prior to sedation.  All questions answered.  Adrian Prows, MD 05/23/2017, 8:36 PM [facility]. [place] Pager: [phone] Office: [phone] If no answer: Cell:  [phone]

## 2017-05-23 NOTE — ED Provider Notes (Signed)
[place] EMERGENCY DEPARTMENT Provider Note   CSN: [id number] Arrival date & time: 05/23/17  1349     History   Chief Complaint Chief Complaint  Patient presents with  . Chest Pain    HPI [facility] Evan Page is a 57 y.o. male.  HPI 57 year old man history of A. fib status post ablation on 05/06/2017 with ablation of an additional second focus of atrial flutter.  He reports that he has been well until this morning when he had pain in his left chest, left arm, and radiating pain to his neck.  He reports that he got somewhat dyspneic and diaphoretic with this.  The pain lasted from 8 until 1 with waxing and waning symptoms.  It has resolved without distinct intervention although he did receive aspirin per EMS in route.  He currently denies any pain.  He denies any similar symptoms in the past.    Past Medical History:  Diagnosis Date  . Atrial fibrillation ([place])    a. 04/2017 s/p PVI.  Marland Kitchen Cataract    left     Patient Active Problem List   Diagnosis Date Noted  . Paroxysmal A-fib ([place]) 05/06/2017  . Paroxysmal atrial fibrillation ([place]) 03/03/2017    Past Surgical History:  Procedure Laterality Date  . ATRIAL FIBRILLATION ABLATION N/A 05/06/2017   Procedure: ATRIAL FIBRILLATION ABLATION;  Surgeon: Constance Haw, MD;  Location: [place] CV LAB;  Service: Cardiovascular;  Laterality: N/A;  . CARDIOVERSION N/A 03/04/2017   Procedure: CARDIOVERSION;  Surgeon: Adrian Prows, MD;  Location: [facility] ENDOSCOPY;  Service: Cardiovascular;  Laterality: N/A;  . wisdomteeth extraction     3 removed - 1 remains        Home Medications    Prior to Admission medications   Medication Sig Start Date End Date Taking? Authorizing Provider  ELIQUIS 5 MG TABS tablet Take 5 mg by mouth 2 (two) times daily. 02/11/17   [provider]  flecainide (TAMBOCOR) 100 MG tablet Take 100 mg by mouth 2 (two) times daily.    [provider]  metoprolol  tartrate (LOPRESSOR) 50 MG tablet Take 1.5 tablets (75 mg total) by mouth 2 (two) times daily. 04/29/17 07/28/17  Constance Haw, MD    Family History Family History  Problem Relation Age of Onset  . Diabetes Mother   . Hyperlipidemia Mother   . Diabetes Father   . Hyperlipidemia Father   . Esophageal cancer Father   . Drug abuse Sister   . Hyperlipidemia Maternal Grandmother   . Colon cancer Neg Hx   . Pancreatic cancer Neg Hx   . Prostate cancer Neg Hx   . Rectal cancer Neg Hx   . Stomach cancer Neg Hx     Social History Social History   Tobacco Use  . Smoking status: Former Research scientist (life sciences)  . Smokeless tobacco: Never Used  Substance Use Topics  . Alcohol use: Yes    Alcohol/week: 1.2 - 1.8 oz    Types: 2 - 3 Glasses of wine per week    Comment: occasionally  . Drug use: No     Allergies   Patient has no known allergies.   Review of Systems Review of Systems  Constitutional: Negative.   HENT: Negative.   Eyes: Negative.   Respiratory: Positive for shortness of breath.   Cardiovascular: Positive for chest pain and palpitations. Negative for leg swelling.  Gastrointestinal: Negative.   Endocrine: Negative.   Genitourinary: Negative.   Musculoskeletal: Negative.  Skin: Negative.   Allergic/Immunologic: Negative.   Neurological: Negative.   Hematological: Negative.   Psychiatric/Behavioral: Negative.   All other systems reviewed and are negative.    Physical Exam Updated Vital Signs BP 111/77   Pulse 85   Temp 98.6 F (37 C) (Oral)   Resp (!) 22   Ht 1.727 m (5\' 8" )   Wt 98.4 kg (217 lb)   SpO2 96%   BMI 32.99 kg/m   Physical Exam  Constitutional: He appears well-developed and well-nourished. He does not appear ill.  HENT:  Head: Normocephalic and atraumatic.  Eyes: Pupils are equal, round, and reactive to light.  Neck: Normal range of motion.  Cardiovascular: Normal rate, intact distal pulses and normal pulses.  Pulmonary/Chest: Effort normal  and breath sounds normal.  Abdominal: Soft. Bowel sounds are normal.  Musculoskeletal: Normal range of motion.       Right lower leg: Normal.  Neurological: He is alert.  Skin: Skin is warm. Capillary refill takes less than 2 seconds.  Psychiatric: He has a normal mood and affect.  Nursing note and vitals reviewed.    ED Treatments / Results  Labs (all labs ordered are listed, but only abnormal results are displayed) Labs Reviewed - No data to display  EKG EKG Interpretation  Date/Time:  Monday May 23 2017 13:57:24 EDT Ventricular Rate:  96 PR Interval:    QRS Duration: 97 QT Interval:  367 QTC Calculation: 464 R Axis:   -98 Text Interpretation:  Atrial flutter with predominant 3:1 AV block Left anterior fascicular block Right ventricular hypertrophy Borderline T abnormalities, inferior leads Confirmed by Pattricia Boss [phone]) on 05/23/2017 2:48:37 PM   Radiology No results found.  Procedures .Sedation Date/Time: 05/23/2017 8:12 PM Performed by: Pattricia Boss, MD Authorized by: Pattricia Boss, MD   Consent:    Consent obtained:  Verbal   Consent given by:  Patient   Risks discussed:  Allergic reaction, dysrhythmia, inadequate sedation, nausea, prolonged hypoxia resulting in organ damage, prolonged sedation necessitating reversal, respiratory compromise necessitating ventilatory assistance and intubation and vomiting   Alternatives discussed:  Analgesia without sedation, anxiolysis and regional anesthesia Universal protocol:    Procedure explained and questions answered to patient or proxy's satisfaction: yes     Relevant documents present and verified: yes     Test results available and properly labeled: yes     Imaging studies available: yes     Required blood products, implants, devices, and special equipment available: yes     Site/side marked: yes     Immediately prior to procedure a time out was called: yes     Patient identity confirmation method:  Verbally  with patient Indications:    Procedure performed:  Cardioversion   Procedure necessitating sedation performed by:  Different physician   Intended level of sedation:  Moderate (conscious sedation) Pre-sedation assessment:    Time since last food or drink:  3   ASA classification: class 1 - normal, healthy patient     Neck mobility: normal     Mouth opening:  3 or more finger widths   Thyromental distance:  4 finger widths   Mallampati score:  I - soft palate, uvula, fauces, pillars visible   Pre-sedation assessments completed and reviewed: airway patency, cardiovascular function, hydration status, mental status, nausea/vomiting, pain level, respiratory function and temperature   Immediate pre-procedure details:    Reassessment: Patient reassessed immediately prior to procedure     Reviewed: vital signs, relevant labs/tests and NPO status  Verified: bag valve mask available, emergency equipment available, intubation equipment available, IV patency confirmed, oxygen available and suction available   Procedure details (see MAR for exact dosages):    Preoxygenation:  Nasal cannula   Sedation:  Propofol   Intra-procedure monitoring:  Blood pressure monitoring, cardiac monitor, continuous pulse oximetry, frequent LOC assessments, frequent vital sign checks and continuous capnometry   Intra-procedure events: none     Total Provider sedation time (minutes):  10 Post-procedure details:    Attendance: Constant attendance by certified staff until patient recovered     Recovery: Patient returned to pre-procedure baseline     Post-sedation assessments completed and reviewed: airway patency, cardiovascular function, hydration status, mental status, nausea/vomiting, pain level, respiratory function and temperature     Patient is stable for discharge or admission: yes     Patient tolerance:  Tolerated well, no immediate complications   (including critical care time)  Medications Ordered in  ED Medications - No data to display   Initial Impression / Assessment and Plan / ED Course  I have reviewed the triage vital signs and the nursing notes.  Pertinent labs & imaging results that were available during my care of the patient were reviewed by me and considered in my medical decision making (see chart for details).    Patient pain free here.  Troponin pending    Final Clinical Impressions(s) / ED Diagnoses   Final diagnoses:  Chest pain, unspecified type  Atrial fibrillation, unspecified type [facility])    ED Discharge Orders    None       Pattricia Boss, MD 05/23/17 2022

## 2017-05-23 NOTE — Progress Notes (Signed)
By signing my name below, I, Schuyler Bain, attest that this documentation has been prepared under the direction and in the presence of Dr. Ranell Patrick. [name].   Electronically Signed: Baldwin Jamaica, Medical Scribe 05/23/2017 at 12:54 PM.  Subjective:    Patient ID: Evan Page, male    DOB: September 15, 1960, 57 y.o.   MRN: [id number] Chief Complaint  Patient presents with  . Chest Pain    onset this morning    HPI   I was pulled urgently to see patient given symptoms.  He had also been pulled acutely from the waiting room due to symptoms and EKG obtained at triage.  History of atrial fibrillation on Eliquis, Flecainide, and Metoprolol. He notes that April 5 he had a procedure for cardioversion.   His left sided chest pain began at 8 a.m this morning and is associated with sweating, bilateral neck pressure, left arm pain, SOB with walking. He has not taken any medications for his pain today. He notes that he is at a 7/10 for pain right now.  No recent aspirin.  Hospital record reviewed with discharge summary from April 5th.   Patient Active Problem List   Diagnosis Date Noted  . Paroxysmal A-fib ([place]) 05/06/2017  . Paroxysmal atrial fibrillation ([place]) 03/03/2017   Past Medical History:  Diagnosis Date  . Atrial fibrillation ([place])    a. 04/2017 s/p PVI.  Marland Kitchen Cataract    left    Past Surgical History:  Procedure Laterality Date  . ATRIAL FIBRILLATION ABLATION N/A 05/06/2017   Procedure: ATRIAL FIBRILLATION ABLATION;  Surgeon: Constance Haw, MD;  Location: [place] CV LAB;  Service: Cardiovascular;  Laterality: N/A;  . CARDIOVERSION N/A 03/04/2017   Procedure: CARDIOVERSION;  Surgeon: Adrian Prows, MD;  Location: [place] ENDOSCOPY;  Service: Cardiovascular;  Laterality: N/A;  . wisdomteeth extraction     3 removed - 1 remains   No Known Allergies Prior to Admission medications   Medication Sig Start Date End Date Taking? Authorizing Provider  ELIQUIS 5 MG TABS tablet  Take 5 mg by mouth 2 (two) times daily. 02/11/17   [provider]  flecainide (TAMBOCOR) 100 MG tablet Take 100 mg by mouth 2 (two) times daily.    [provider]  metoprolol tartrate (LOPRESSOR) 50 MG tablet Take 1.5 tablets (75 mg total) by mouth 2 (two) times daily. 04/29/17 07/28/17  Constance Haw, MD   Social History   Socioeconomic History  . Marital status: Divorced    Spouse name: Not on file  . Number of children: Not on file  . Years of education: Not on file  . Highest education level: Not on file  Occupational History  . Not on file  Social Needs  . Financial resource strain: Not on file  . Food insecurity:    Worry: Not on file    Inability: Not on file  . Transportation needs:    Medical: Not on file    Non-medical: Not on file  Tobacco Use  . Smoking status: Former Research scientist (life sciences)  . Smokeless tobacco: Never Used  Substance and Sexual Activity  . Alcohol use: Yes    Alcohol/week: 1.2 - 1.8 oz    Types: 2 - 3 Glasses of wine per week    Comment: occasionally  . Drug use: No  . Sexual activity: Not on file  Lifestyle  . Physical activity:    Days per week: Not on file    Minutes per session: Not on file  .  Stress: Not on file  Relationships  . Social connections:    Talks on phone: Not on file    Gets together: Not on file    Attends religious service: Not on file    Active member of club or organization: Not on file    Attends meetings of clubs or organizations: Not on file    Relationship status: Not on file  . Intimate partner violence:    Fear of current or ex partner: Not on file    Emotionally abused: Not on file    Physically abused: Not on file    Forced sexual activity: Not on file  Other Topics Concern  . Not on file  Social History Narrative   ** Merged History Encounter **        Review of Systems  Constitutional: Positive for diaphoresis.  Respiratory: Positive for chest tightness.   Cardiovascular: Positive for  chest pain.  Musculoskeletal: Positive for neck pain.  Neurological: Negative for dizziness and light-headedness.       Objective:   Physical Exam  Constitutional: He is oriented to person, place, and time. He appears well-developed and well-nourished.  HENT:  Head: Normocephalic and atraumatic.  Cardiovascular: An irregularly irregular rhythm present. Tachycardia present.  Pulmonary/Chest: Effort normal and breath sounds normal. He has no rales.  Musculoskeletal:  Equal strength extremities   Neurological: He is alert and oriented to person, place, and time.  Skin: Skin is warm. He is diaphoretic.  Psychiatric: He has a normal mood and affect. His behavior is normal.   12:50 PM EKG; A-fib rate appx 99, possible right sided conduction defect.  EMS called for transport and pt placed on monitor, rate at 105bpm We are attempting IV for access.  Vitals: BP: 132/81, HR of 89.   IV placed in Left AC 18 gauge running normal saline 12:54 PM   1:00 PM report given to EMS with transfer or care. Aspirin discussed EMS to give.     Assessment & Plan:   [facility] Stankus is a 57 y.o. male Other chest pain - Plan: EKG 12-Lead  Left-sided chest pain - Plan: Insert peripheral IV, sodium chloride 0.9 % bolus 1,000 mL  Atrial fibrillation, unspecified type ([place]) - Plan: Insert peripheral IV, sodium chloride 0.9 % bolus 1,000 mL  Diaphoresis  DOE (dyspnea on exertion)  History of atrial fibrillation status post catheter ablation April 5.  Presented emergently this morning after onset of left-sided chest pain with radiation to neck, left arm, associated diaphoresis and dyspnea starting at 8 AM.  Rates pain 7 out of 10.  EKG obtained with atrial fibrillation, blood pressure stable.  Alert and oriented.  -Placed on monitor as above, maintained atrial fibrillation with rate approximately 90 to low 100's, with maintenance of blood pressure.  No acute arrhythmia treatment  provided.  -Based on concerning symptoms for acute coronary syndrome, IV placed EMS called for transport to ER.  EMS on scene prior to aspirin dosing, plans for EMS to provide aspirin in route to hospital.  -Plan discussed with patient, Spanish and [place] spoken with understanding expressed   Meds ordered this encounter  Medications  . sodium chloride 0.9 % bolus 1,000 mL   There are no Patient Instructions on file for this visit. I personally performed the services described in this documentation, which was scribed in my presence. The recorded information has been reviewed and considered for accuracy and completeness, addended by me as needed, and agree with information above.  Signed,   Merri Ray, MD [facility].  05/23/17 1:19 PM

## 2017-05-23 NOTE — Patient Instructions (Signed)
Patient sent by EMS, verbal instructions of plan provided.

## 2017-05-23 NOTE — ED Triage Notes (Signed)
Pt arrived via [facility] from Urgent Care c/o left sided throbbing chest pain that radiated down his left arm earlier.  EMS gave 1 SL Nitro, 324mg  ASA, pt arrives pain free. Hx A-fib with previous cardioversion.

## 2017-05-23 NOTE — Progress Notes (Signed)
RT stood in for cardioversion. No adverse events noted at this time. Pt's o2 sat is 100%, RR 15, ETCO2 37 on a 2lpm N/C. Will continue to monitor.

## 2017-05-24 ENCOUNTER — Ambulatory Visit (HOSPITAL_COMMUNITY)
Admission: EM | Disposition: A | Discharge status: Home / Self Care | Payer: Self-pay | Source: Home / Self Care | Attending: Emergency Medicine

## 2017-05-24 DIAGNOSIS — I484 Atypical atrial flutter: Secondary | ICD-10-CM | POA: Diagnosis not present

## 2017-05-24 DIAGNOSIS — I444 Left anterior fascicular block: Secondary | ICD-10-CM | POA: Diagnosis not present

## 2017-05-24 DIAGNOSIS — R0789 Other chest pain: Secondary | ICD-10-CM | POA: Diagnosis not present

## 2017-05-24 DIAGNOSIS — I480 Paroxysmal atrial fibrillation: Secondary | ICD-10-CM | POA: Diagnosis not present

## 2017-05-24 HISTORY — PX: LEFT HEART CATH AND CORONARY ANGIOGRAPHY: CATH118249

## 2017-05-24 LAB — PROTIME-INR
INR: 1.17
PROTHROMBIN TIME: 14.8 s (ref 11.4–15.2)

## 2017-05-24 LAB — TROPONIN I

## 2017-05-24 SURGERY — LEFT HEART CATH AND CORONARY ANGIOGRAPHY
Anesthesia: LOCAL

## 2017-05-24 MED ORDER — HEPARIN SODIUM (PORCINE) 1000 UNIT/ML IJ SOLN
INTRAMUSCULAR | Status: AC
  Filled 2017-05-24: qty 1

## 2017-05-24 MED ORDER — SODIUM CHLORIDE 0.9% FLUSH: 3.0000 mL | INTRAVENOUS | Status: DC | PRN

## 2017-05-24 MED ORDER — SODIUM CHLORIDE 0.9 % IV SOLN: 250.0000 mL | INTRAVENOUS | Status: DC | PRN

## 2017-05-24 MED ORDER — ACETAMINOPHEN 325 MG PO TABS: 650.0000 mg | ORAL_TABLET | ORAL | Status: DC | PRN

## 2017-05-24 MED ORDER — FENTANYL CITRATE (PF) 100 MCG/2ML IJ SOLN
INTRAMUSCULAR | Status: AC
  Filled 2017-05-24: qty 2

## 2017-05-24 MED ORDER — SODIUM CHLORIDE 0.9 % IV SOLN
INTRAVENOUS | Status: AC | PRN
  Administered 2017-05-24: 96 mL/h via INTRAVENOUS

## 2017-05-24 MED ORDER — SODIUM CHLORIDE 0.9 % WEIGHT BASED INFUSION: 3.0000 mL/kg/h | INTRAVENOUS | Status: AC

## 2017-05-24 MED ORDER — FENTANYL CITRATE (PF) 100 MCG/2ML IJ SOLN
INTRAMUSCULAR | Status: DC | PRN
  Administered 2017-05-24: 25 ug via INTRAVENOUS

## 2017-05-24 MED ORDER — SODIUM CHLORIDE 0.9% FLUSH: 3.0000 mL | Freq: Two times a day (BID) | INTRAVENOUS | Status: DC

## 2017-05-24 MED ORDER — SODIUM CHLORIDE 0.9% FLUSH
3.0000 mL | Freq: Two times a day (BID) | INTRAVENOUS | Status: DC
  Administered 2017-05-24: 3 mL via INTRAVENOUS

## 2017-05-24 MED ORDER — SODIUM CHLORIDE 0.9 % WEIGHT BASED INFUSION: 1.0000 mL/kg/h | INTRAVENOUS | Status: DC

## 2017-05-24 MED ORDER — LIDOCAINE HCL (PF) 1 % IJ SOLN
INTRAMUSCULAR | Status: DC | PRN
  Administered 2017-05-24: 2 mL via INTRADERMAL

## 2017-05-24 MED ORDER — MIDAZOLAM HCL 2 MG/2ML IJ SOLN
INTRAMUSCULAR | Status: AC
  Filled 2017-05-24: qty 2

## 2017-05-24 MED ORDER — IOHEXOL 350 MG/ML SOLN
INTRAVENOUS | Status: DC | PRN
  Administered 2017-05-24: 40 mL via INTRA_ARTERIAL

## 2017-05-24 MED ORDER — LIDOCAINE HCL (PF) 1 % IJ SOLN
INTRAMUSCULAR | Status: AC
  Filled 2017-05-24: qty 30

## 2017-05-24 MED ORDER — ASPIRIN 81 MG PO CHEW: 81.0000 mg | CHEWABLE_TABLET | ORAL | Status: AC

## 2017-05-24 MED ORDER — HEPARIN (PORCINE) IN NACL 1000-0.9 UT/500ML-% IV SOLN
INTRAVENOUS | Status: AC
  Filled 2017-05-24: qty 1000

## 2017-05-24 MED ORDER — ONDANSETRON HCL 4 MG/2ML IJ SOLN: 4.0000 mg | Freq: Four times a day (QID) | INTRAMUSCULAR | Status: DC | PRN

## 2017-05-24 MED ORDER — HEPARIN SODIUM (PORCINE) 1000 UNIT/ML IJ SOLN
INTRAMUSCULAR | Status: DC | PRN
  Administered 2017-05-24: 5000 [IU] via INTRAVENOUS

## 2017-05-24 MED ORDER — SODIUM CHLORIDE 0.9 % IV SOLN: INTRAVENOUS | Status: DC

## 2017-05-24 MED ORDER — VERAPAMIL HCL 2.5 MG/ML IV SOLN
INTRAVENOUS | Status: DC | PRN
  Administered 2017-05-24: 4 mL via INTRA_ARTERIAL

## 2017-05-24 MED ORDER — VERAPAMIL HCL 2.5 MG/ML IV SOLN
INTRAVENOUS | Status: AC
  Filled 2017-05-24: qty 2

## 2017-05-24 MED ORDER — MIDAZOLAM HCL 2 MG/2ML IJ SOLN
INTRAMUSCULAR | Status: DC | PRN
  Administered 2017-05-24: 1 mg via INTRAVENOUS

## 2017-05-24 SURGICAL SUPPLY — 13 items
BAND CMPR LRG ZPHR (HEMOSTASIS) ×1
BAND ZEPHYR COMPRESS 30 LONG (HEMOSTASIS) ×1 IMPLANT
CATH INFINITI 5 FR JL3.5 (CATHETERS) ×1 IMPLANT
CATH INFINITI JR4 5F (CATHETERS) ×1 IMPLANT
CATH LAUNCHER 5F JL3 (CATHETERS) IMPLANT
CATHETER LAUNCHER 5F JL3 (CATHETERS) ×2
GLIDESHEATH SLEND A-KIT 6F 20G (SHEATH) ×1 IMPLANT
GUIDEWIRE INQWIRE 1.5J.035X260 (WIRE) IMPLANT
INQWIRE 1.5J .035X260CM (WIRE) ×2
KIT HEART LEFT (KITS) ×2 IMPLANT
PACK CARDIAC CATHETERIZATION (CUSTOM PROCEDURE TRAY) ×2 IMPLANT
TRANSDUCER W/STOPCOCK (MISCELLANEOUS) ×2 IMPLANT
TUBING CIL FLEX 10 FLL-RA (TUBING) ×2 IMPLANT

## 2017-05-24 NOTE — Discharge Summary (Signed)
Physician Discharge Summary  Patient ID: Evan Page MRN: [id number] DOB/AGE: [birth year] 57 y.o.  Admit date: 05/23/2017 Discharge date: 05/24/2017  Admission Diagnoses: Chest pain  Discharge Diagnoses:  Active Problems:   Paroxysmal atrial fibrillation ([facility]) Atypical atrial flutter  Discharged Condition: good  Hospital Course:   57 year old Hispanic male with atrial fibrillation/flutter status post ablation on 05/06/2017, OSA, previous negative nuclear stress stress, now admitted with chest pain relieved with nitroglycerin. He was found to be in atypical atrial flutter for which she underwent successful cardioversion on 0/22/2019. Given his nitrate responsive chest pain, he was recommended to undergo coronary angiography for definitive evaluation of his coronary anatomy.   Cath showed normal coronaries with no significant coronary artery disease. Chest pain was though to be noncardiac in nature. Recommend resuming eliquis.  Consults: None  Significant Diagnostic Studies: labs:  Results for MARL, SEAGO (MRN [id number]) as of 05/24/2017 15:34  Ref. Range 05/23/2017 16:35  WBC Latest Ref Range: 4.0 - 10.5 K/uL 8.1  RBC Latest Ref Range: 4.22 - 5.81 MIL/uL 4.14 (L)  Hemoglobin Latest Ref Range: 13.0 - 17.0 g/dL 13.9  HCT Latest Ref Range: 39.0 - 52.0 % 42.9  MCV Latest Ref Range: 78.0 - 100.0 fL 103.6 (H)  MCH Latest Ref Range: 26.0 - 34.0 pg 33.6  MCHC Latest Ref Range: 30.0 - 36.0 g/dL 32.4  RDW Latest Ref Range: 11.5 - 15.5 % 14.6  Platelets Latest Ref Range: 150 - 400 K/uL 279   Results for DMARIO, RUSSOM (MRN [id number]) as of 05/24/2017 15:34  Ref. Range 05/24/2017 00:19 05/24/2017 06:00  Troponin I Latest Ref Range: <0.03 ng/mL <0.03 <0.03    Treatments: Cardioversion  Discharge Exam: Blood pressure 114/75, pulse 79, temperature 98 F (36.7 C), temperature source Oral, resp. rate 10, height 5\' 8"  (1.727 m), weight 96 kg (211 lb  11.2 oz), SpO2 98 %.  Physical Exam  Constitutional: He is oriented to person, place, and time. He appears well-developed and well-nourished. No distress.  HENT:  Head: Normocephalic and atraumatic.  Eyes: Pupils are equal, round, and reactive to light. Conjunctivae are normal.  Neck: Normal range of motion. Neck supple. No JVD present.  Cardiovascular: Normal rate, regular rhythm and normal heart sounds.  No murmur heard. Pulmonary/Chest: Effort normal and breath sounds normal. He has no wheezes. He has no rales.  Abdominal: Soft. Bowel sounds are normal. There is no tenderness.  Musculoskeletal: He exhibits no edema.  Lymphadenopathy:    He has no cervical adenopathy.  Neurological: He is alert and oriented to person, place, and time.  Skin: Skin is warm and dry.    Disposition:   Discharge Instructions    Amb referral to AFIB Clinic   Complete by:  As directed      Allergies as of 05/24/2017   No Known Allergies     Medication List    TAKE these medications   ELIQUIS 5 MG Tabs tablet Generic drug:  apixaban Take 5 mg by mouth 2 (two) times daily.   flecainide 100 MG tablet Commonly known as:  TAMBOCOR Take 50 mg by mouth 2 (two) times daily.   metoprolol tartrate 25 MG tablet Commonly known as:  LOPRESSOR Take 25 mg by mouth 2 (two) times daily. What changed:  Another medication with the same name was removed. Continue taking this medication, and follow the directions you see here.           SignedNigel Mormon 05/24/2017, 3:33 PM  Nigel Mormon, MD [facility] [place] Pager: [phone] Office: [phone] If no answer Cell [phone]

## 2017-05-25 ENCOUNTER — Ambulatory Visit: Payer: BC Managed Care – PPO | Admitting: Physician Assistant

## 2017-05-25 ENCOUNTER — Encounter (HOSPITAL_COMMUNITY): Payer: Self-pay | Admitting: Cardiology

## 2017-05-25 MED FILL — Heparin Sod (Porcine)-NaCl IV Soln 1000 Unit/500ML-0.9%: INTRAVENOUS | Qty: 1000 | Status: AC

## 2017-06-01 ENCOUNTER — Encounter (HOSPITAL_COMMUNITY): Payer: Self-pay | Admitting: Nurse Practitioner

## 2017-06-01 ENCOUNTER — Ambulatory Visit (HOSPITAL_COMMUNITY)
Admission: RE | Admit: 2017-06-01 | Discharge: 2017-06-01 | Disposition: A | Discharge status: Home / Self Care | Payer: BC Managed Care – PPO | Source: Ambulatory Visit | Attending: Nurse Practitioner | Admitting: Nurse Practitioner

## 2017-06-01 VITALS — BP 126/68 | HR 56 | Ht 68.0 in | Wt 216.0 lb

## 2017-06-01 DIAGNOSIS — Z833 Family history of diabetes mellitus: Secondary | ICD-10-CM | POA: Insufficient documentation

## 2017-06-01 DIAGNOSIS — I480 Paroxysmal atrial fibrillation: Secondary | ICD-10-CM

## 2017-06-01 DIAGNOSIS — Z7901 Long term (current) use of anticoagulants: Secondary | ICD-10-CM | POA: Insufficient documentation

## 2017-06-01 DIAGNOSIS — R001 Bradycardia, unspecified: Secondary | ICD-10-CM | POA: Diagnosis not present

## 2017-06-01 DIAGNOSIS — G4730 Sleep apnea, unspecified: Secondary | ICD-10-CM | POA: Insufficient documentation

## 2017-06-01 DIAGNOSIS — Z87891 Personal history of nicotine dependence: Secondary | ICD-10-CM | POA: Diagnosis not present

## 2017-06-01 DIAGNOSIS — I4891 Unspecified atrial fibrillation: Secondary | ICD-10-CM | POA: Insufficient documentation

## 2017-06-01 DIAGNOSIS — Z79899 Other long term (current) drug therapy: Secondary | ICD-10-CM | POA: Diagnosis not present

## 2017-06-01 DIAGNOSIS — Z800 Family history of malignant neoplasm of digestive organs: Secondary | ICD-10-CM | POA: Diagnosis not present

## 2017-06-01 DIAGNOSIS — Z8249 Family history of ischemic heart disease and other diseases of the circulatory system: Secondary | ICD-10-CM | POA: Insufficient documentation

## 2017-06-01 DIAGNOSIS — K219 Gastro-esophageal reflux disease without esophagitis: Secondary | ICD-10-CM | POA: Insufficient documentation

## 2017-06-01 NOTE — Progress Notes (Signed)
Primary Care Physician: Hillis Range Referring Physician: Dr. Fortino Sic [name] is a 57 y.o. male with a h/o paroxysmal and persistent afib that is in the afib clinic for f/u one month following ablation. He noted some afib early on in the procedure but none for the last few weeks. No swallowing or groin issues. He had a cardiac cath 4/23, with Dr. Annitta Needs which showed normal coronary arteries.  Today, he denies symptoms of palpitations, chest pain, shortness of breath, orthopnea, PND, lower extremity edema, dizziness, presyncope, syncope, or neurologic sequela. The patient is tolerating medications without difficulties and is otherwise without complaint today.   Past Medical History:  Diagnosis Date  . Atrial fibrillation ([place])    a. 04/2017 s/p PVI.  Marland Kitchen Cataract    left   . GERD (gastroesophageal reflux disease)   . Sleep apnea    "waiting on my CPAP" (05/23/2017)   Past Surgical History:  Procedure Laterality Date  . ATRIAL FIBRILLATION ABLATION N/A 05/06/2017   Procedure: ATRIAL FIBRILLATION ABLATION;  Surgeon: Constance Haw, MD;  Location: [facility];  Service: Cardiovascular;  Laterality: N/A;  . CARDIOVERSION N/A 03/04/2017   Procedure: CARDIOVERSION;  Surgeon: Adrian Prows, MD;  Location: [facility];  Service: Cardiovascular;  Laterality: N/A;  . LEFT HEART CATH AND CORONARY ANGIOGRAPHY N/A 05/24/2017   Procedure: LEFT HEART CATH AND CORONARY ANGIOGRAPHY possible angioplasty;  Surgeon: Nigel Mormon, MD;  Location: [facility];  Service: Cardiovascular;  Laterality: N/A;  . WISDOM TOOTH EXTRACTION     3 removed - 1 remains    Current Outpatient Medications  Medication Sig Dispense Refill  . apixaban (ELIQUIS) 5 MG TABS tablet Take 5 mg by mouth 2 (two) times daily.    . flecainide (TAMBOCOR) 100 MG tablet Take 50 mg by mouth 2 (two) times daily.     . metoprolol tartrate (LOPRESSOR) 25 MG tablet Take 25 mg by mouth 2 (two)  times daily.     No current facility-administered medications for this encounter.     No Known Allergies  Social History   Socioeconomic History  . Marital status: Divorced    Spouse name: Not on file  . Number of children: Not on file  . Years of education: Not on file  . Highest education level: Not on file  Occupational History  . Not on file  Social Needs  . Financial resource strain: Not on file  . Food insecurity:    Worry: Not on file    Inability: Not on file  . Transportation needs:    Medical: Not on file    Non-medical: Not on file  Tobacco Use  . Smoking status: Former Smoker    Packs/day: 1.00    Years: 20.00    Pack years: 20.00    Types: Cigarettes    Last attempt to quit: 1998    Years since quitting: 21.3  . Smokeless tobacco: Never Used  Substance and Sexual Activity  . Alcohol use: Yes    Alcohol/week: 1.8 oz    Types: 3 Glasses of wine per week  . Drug use: No  . Sexual activity: Yes  Lifestyle  . Physical activity:    Days per week: Not on file    Minutes per session: Not on file  . Stress: Not on file  Relationships  . Social connections:    Talks on phone: Not on file    Gets together: Not on file  Attends religious service: Not on file    Active member of club or organization: Not on file    Attends meetings of clubs or organizations: Not on file    Relationship status: Not on file  . Intimate partner violence:    Fear of current or ex partner: Not on file    Emotionally abused: Not on file    Physically abused: Not on file    Forced sexual activity: Not on file  Other Topics Concern  . Not on file  Social History Narrative   ** Merged History Encounter **        Family History  Problem Relation Age of Onset  . Diabetes Mother   . Hyperlipidemia Mother   . Diabetes Father   . Hyperlipidemia Father   . Esophageal cancer Father   . Drug abuse Sister   . Hyperlipidemia Maternal Grandmother   . Colon cancer Neg Hx   .  Pancreatic cancer Neg Hx   . Prostate cancer Neg Hx   . Rectal cancer Neg Hx   . Stomach cancer Neg Hx     ROS- All systems are reviewed and negative except as per the HPI above  Physical Exam: Vitals:   06/01/17 1513  BP: 126/68  Pulse: (!) 56  Weight: 216 lb (98 kg)  Height: 5\' 8"  (1.727 m)   Wt Readings from Last 3 Encounters:  06/01/17 216 lb (98 kg)  05/24/17 211 lb 11.2 oz (96 kg)  05/07/17 217 lb 12.8 oz (98.8 kg)    Labs: Lab Results  Component Value Date   NA 143 05/23/2017   K 4.5 05/23/2017   CL 112 (H) 05/23/2017   CO2 23 05/23/2017   GLUCOSE 91 05/23/2017   BUN 17 05/23/2017   CREATININE 1.42 (H) 05/23/2017   CALCIUM 9.0 05/23/2017   MG 2.2 05/23/2017   Lab Results  Component Value Date   INR 1.17 05/24/2017   Lab Results  Component Value Date   CHOL 146 10/20/2015   HDL 38 (L) 10/20/2015   LDLCALC 47 10/20/2015   TRIG 306 (H) 10/20/2015     GEN- The patient is well appearing, alert and oriented x 3 today.   Head- normocephalic, atraumatic Eyes-  Sclera clear, conjunctiva pink Ears- hearing intact Oropharynx- clear Neck- supple, no JVP Lymph- no cervical lymphadenopathy Lungs- Clear to ausculation bilaterally, normal work of breathing Heart- Regular rate and rhythm, no murmurs, rubs or gallops, PMI not laterally displaced GI- soft, NT, ND, + BS Extremities- no clubbing, cyanosis, or edema MS- no significant deformity or atrophy Skin- no rash or lesion Psych- euthymic mood, full affect Neuro- strength and sensation are intact  EKG-Sinus bradycardia, at 56 bpm, pr int 144 ms, qrs int 96 ms, qtc 443 ms Epic records reviewed   Assessment and Plan: 1. Afib S/p ablation, 4/6, and is enjoying SR Continue flecainide 100 mg bid Continue metoprolol tartrate 25 mg bid Continue eliquis 5 mg bid without interruption  2. Cardiac cath 4/23 Normal coronaries Has f/u with Dr. Einar Gip tomorrow   F/u with Dr. Curt Bears 7/29  Geroge Baseman. Quorra Rosene,  [facility] [address] [place], [place] [address] [phone]

## 2017-07-01 ENCOUNTER — Ambulatory Visit: Payer: BC Managed Care – PPO | Admitting: Physician Assistant

## 2017-07-23 ENCOUNTER — Other Ambulatory Visit: Payer: Self-pay | Admitting: Cardiology

## 2017-07-26 MED ORDER — METOPROLOL TARTRATE 25 MG PO TABS: 25.0000 mg | ORAL_TABLET | Freq: Two times a day (BID) | ORAL | 2 refills | Status: DC

## 2017-07-26 NOTE — Telephone Encounter (Signed)
Pt's medication was sent to pt's pharmacy as requested. Confirmation received.  °

## 2017-08-01 ENCOUNTER — Ambulatory Visit: Payer: Self-pay | Admitting: Neurology

## 2017-08-29 ENCOUNTER — Ambulatory Visit: Payer: BC Managed Care – PPO | Admitting: Cardiology

## 2017-10-04 ENCOUNTER — Ambulatory Visit (INDEPENDENT_AMBULATORY_CARE_PROVIDER_SITE_OTHER): Payer: BC Managed Care – PPO | Admitting: Cardiology

## 2017-10-04 ENCOUNTER — Encounter (INDEPENDENT_AMBULATORY_CARE_PROVIDER_SITE_OTHER): Payer: Self-pay

## 2017-10-04 ENCOUNTER — Encounter: Payer: Self-pay | Admitting: Cardiology

## 2017-10-04 VITALS — BP 120/60 | HR 59 | Ht 68.0 in | Wt 221.0 lb

## 2017-10-04 DIAGNOSIS — I4819 Other persistent atrial fibrillation: Secondary | ICD-10-CM

## 2017-10-04 DIAGNOSIS — I481 Persistent atrial fibrillation: Secondary | ICD-10-CM

## 2017-10-04 MED ORDER — DILTIAZEM HCL ER COATED BEADS 120 MG PO CP24: 120.0000 mg | ORAL_CAPSULE | Freq: Every day | ORAL | 3 refills | Status: DC

## 2017-10-04 NOTE — Patient Instructions (Addendum)
Medication Instructions:  Your physician has recommended you make the following change in your medication:  1. STOP Eliquis 2. STOP Metoprolol tartrate (Lopressor) 3. START Diltiazem 120 mg once daily  * If you need a refill on your cardiac medications before your next appointment, please call your pharmacy.   Labwork: None ordered  Testing/Procedures: None ordered  Follow-Up: Your physician recommends that you schedule a follow-up appointment in: 3 months with Dr. Curt Bears.   Thank you for choosing [facility]!!   Trinidad Curet, RN [phone]  Any Other Special Instructions Will Be Listed Below (If Applicable).

## 2017-10-04 NOTE — Progress Notes (Signed)
Electrophysiology Office Note   Date:  10/04/2017   ID:  Evan Page, [place] [birth year], MRN [id number]  PCP:  Tereasa Coop, PA-C  Cardiologist:  Einar Gip Primary Electrophysiologist:  Constance Haw, MD    No chief complaint on file.    History of Present Illness: Evan Page is a 57 y.o. male who is being seen today for the evaluation of atrial fibrillation at the request of Adrian Prows. Presenting today for electrophysiology evaluation.  He has atrial fibrillation.  He is currently managed with flecainide, metoprolol, and Eliquis.   On 02/13/17 he began noticing more exertional dizziness and fatigue.  Of note, he has a 10-pack-year history, quit smoking in 1998.  He has been scheduled for a sleep study.  He was put on flecainide and was noted to be in atrial flutter post flecainide.  He had a cardioversion after the diagnosis of atrial flutter to sinus rhythm.  Cardioversion was on a 03/04/17.  He is planned for atrial fibrillation ablation 05/06/17.   Today, denies symptoms of chest pain, shortness of breath, orthopnea, PND, lower extremity edema, claudication, dizziness, presyncope, syncope, bleeding, or neurologic sequela. The patient is tolerating medications without difficulties.  Continues to have episodic palpitations.  These occur once every few weeks.  They last for 3 to 4 hours at a time.  He does take flecainide and metoprolol.  He feels like his symptoms are fairly well-controlled.  He has been having some erectile dysfunction but he thinks may be due to his medications.   Past Medical History:  Diagnosis Date  . Atrial fibrillation ([place])    a. 04/2017 s/p PVI.  Marland Kitchen Cataract    left   . GERD (gastroesophageal reflux disease)   . Sleep apnea    "waiting on my CPAP" (05/23/2017)   Past Surgical History:  Procedure Laterality Date  . ATRIAL FIBRILLATION ABLATION N/A 05/06/2017   Procedure: ATRIAL FIBRILLATION ABLATION;  Surgeon: Constance Haw, MD;  Location: [facility];  Service: Cardiovascular;  Laterality: N/A;  . CARDIOVERSION N/A 03/04/2017   Procedure: CARDIOVERSION;  Surgeon: Adrian Prows, MD;  Location: [facility];  Service: Cardiovascular;  Laterality: N/A;  . LEFT HEART CATH AND CORONARY ANGIOGRAPHY N/A 05/24/2017   Procedure: LEFT HEART CATH AND CORONARY ANGIOGRAPHY possible angioplasty;  Surgeon: Nigel Mormon, MD;  Location: [facility];  Service: Cardiovascular;  Laterality: N/A;  . WISDOM TOOTH EXTRACTION     3 removed - 1 remains     Current Outpatient Medications  Medication Sig Dispense Refill  . diltiazem (CARDIZEM CD) 120 MG 24 hr capsule Take 1 capsule (120 mg total) by mouth daily. 30 capsule 3  . flecainide (TAMBOCOR) 100 MG tablet Take 50 mg by mouth 2 (two) times daily.      No current facility-administered medications for this visit.     Allergies:   Patient has no known allergies.   Social History:  The patient  reports that he quit smoking about 21 years ago. His smoking use included cigarettes. He has a 20.00 pack-year smoking history. He has never used smokeless tobacco. He reports that he drinks about 3.0 standard drinks of alcohol per week. He reports that he does not use drugs.   Family History:  The patient's family history includes Diabetes in his father and mother; Drug abuse in his sister; Esophageal cancer in his father; Hyperlipidemia in his father, maternal grandmother, and mother.    ROS:  Please see  the history of present illness.   Otherwise, review of systems is positive for weight change, leg swelling.   All other systems are reviewed and negative.   PHYSICAL EXAM: VS:  BP 120/60   Pulse (!) 59   Ht 5\' 8"  (1.727 m)   Wt 221 lb (100.2 kg)   BMI 33.60 kg/m  , BMI Body mass index is 33.6 kg/m. GEN: Well nourished, well developed, in no acute distress  HEENT: normal  Neck: no JVD, carotid bruits, or masses Cardiac: RRR; no murmurs, rubs, or gallops,no  edema  Respiratory:  clear to auscultation bilaterally, normal work of breathing GI: soft, nontender, nondistended, + BS MS: no deformity or atrophy  Skin: warm and dry Neuro:  Strength and sensation are intact Psych: euthymic mood, full affect  EKG:  EKG is ordered today. Personal review of the ekg ordered shows None sinus rhythm, rate 59, voltage for LVH, left axis deviation, early RS transition   Recent Labs: 02/11/2017: TSH 2.710 05/23/2017: ALT 32; BUN 17; Creatinine, Ser 1.42; Hemoglobin 13.9; Magnesium 2.2; Platelets 279; Potassium 4.5; Sodium 143    Lipid Panel     Component Value Date/Time   CHOL 146 10/20/2015 1651   TRIG 306 (H) 10/20/2015 1651   HDL 38 (L) 10/20/2015 1651   CHOLHDL 3.8 10/20/2015 1651   VLDL 61 (H) 10/20/2015 1651   LDLCALC 47 10/20/2015 1651     Wt Readings from Last 3 Encounters:  10/04/17 221 lb (100.2 kg)  06/01/17 216 lb (98 kg)  05/24/17 211 lb 11.2 oz (96 kg)      Other studies Reviewed: Additional studies/ records that were reviewed today include: Myoview 02/18/17 - personally reviewed Review of the above records today demonstrates:  Low risk study, EF 55%  Echocardiogram 02/14/2017: Left ventricle cavity is normal in size. Normal global wall motion. Unable to evaluate diastolic function due to A. Fibrillation. Calculated EF 68%. Trace mitral regurgitation. Normal left atrial size  ASSESSMENT AND PLAN:  1.  Persistent atrial fibrillation/atrial flutter: On Eliquis, flecainide, and metoprolol.  Due to his low stroke risk we Karston Hyland stop Eliquis today.  He is status post atrial fibrillation ablation 05/06/2017.  He has had recurrences of atrial fibrillation and thus we Lovely Kerins keep him on his antiarrhythmics.  He is having some erectile dysfunction and thus we Kaydence Menard stop his metoprolol and start him on diltiazem.  This patients CHA2DS2-VASc Score and unadjusted Ischemic Stroke Rate (% per year) is equal to 0.2 % stroke rate/year from a score of  0  Above score calculated as 1 point each if present [CHF, HTN, DM, Vascular=MI/PAD/Aortic Plaque, Age if 65-74, or Male] Above score calculated as 2 points each if present [Age > 75, or Stroke/TIA/TE]    Current medicines are reviewed at length with the patient today.   The patient does not have concerns regarding his medicines.  The following changes were made today: Stop Eliquis, metoprolol, start diltiazem   Labs/ tests ordered today include:  Orders Placed This Encounter  Procedures  . EKG 12-Lead    Disposition:   FU with Clarice Bonaventure 3 months  Signed, Kimiya Brunelle Meredith Leeds, MD  10/04/2017 4:11 PM     [address] [place] [address] [phone] (office) [phone] (fax)

## 2017-10-18 ENCOUNTER — Encounter: Payer: Self-pay | Admitting: Neurology

## 2017-10-20 ENCOUNTER — Encounter: Payer: Self-pay | Admitting: Neurology

## 2017-10-20 ENCOUNTER — Ambulatory Visit (INDEPENDENT_AMBULATORY_CARE_PROVIDER_SITE_OTHER): Payer: BC Managed Care – PPO | Admitting: Neurology

## 2017-10-20 VITALS — BP 139/77 | HR 67 | Ht 68.0 in | Wt 225.0 lb

## 2017-10-20 DIAGNOSIS — Z9889 Other specified postprocedural states: Secondary | ICD-10-CM | POA: Diagnosis not present

## 2017-10-20 DIAGNOSIS — Z9989 Dependence on other enabling machines and devices: Secondary | ICD-10-CM | POA: Diagnosis not present

## 2017-10-20 DIAGNOSIS — G4733 Obstructive sleep apnea (adult) (pediatric): Secondary | ICD-10-CM

## 2017-10-20 DIAGNOSIS — Z9289 Personal history of other medical treatment: Secondary | ICD-10-CM

## 2017-10-20 DIAGNOSIS — Z8679 Personal history of other diseases of the circulatory system: Secondary | ICD-10-CM | POA: Diagnosis not present

## 2017-10-20 NOTE — Patient Instructions (Signed)
Please continue using your CPAP regularly. While your insurance requires that you use CPAP at least 4 hours each night on 70% of the nights, I recommend, that you not skip any nights and use it throughout the night if you can. Getting used to CPAP and staying with the treatment long term does take time and patience and discipline. Untreated obstructive sleep apnea when it is moderate to severe can have an adverse impact on cardiovascular health and raise her risk for heart disease, arrhythmias, hypertension, congestive heart failure, stroke and diabetes. Untreated obstructive sleep apnea causes sleep disruption, nonrestorative sleep, and sleep deprivation. This can have an impact on your day to day functioning and cause daytime sleepiness and impairment of cognitive function, memory loss, mood disturbance, and problems focussing. Using CPAP regularly can improve these symptoms.  Keep up the good work! We can see you in 6 months with one of our nurse practitioners as you are stable. I will see you after that.

## 2017-10-20 NOTE — Progress Notes (Signed)
Subjective:    Patient ID: Evan Page is a 57 y.o. male.  HPI     Interim history:   Mr. Evan Page is a 57 year old right-handed gentleman with an underlying medical history of atrial fibrillation, hyperlipidemia, and obesity, who presents for follow-up consultation of his obstructive sleep apnea, after recent sleep study testing and starting CPAP therapy. The patient is accompanied by interpreter today. I first met him on 03/10/2017 at the request of his cardiologist, at which time patient reported snoring and daytime somnolence as well as breathing pauses while asleep. He was advised to proceed with a sleep study. He had a split-night sleep study on 04/21/2017. I went over his test results with him in detail today. Sleep efficiency was 89% at baseline with a sleep latency of 8.5 minutes and REM latency of 54.5 minutes. He had a total AHI of 38.6 per hour, EKG showed PVCs and A. fib. His REM AHI was 64 per hour, supine AHI 60.6 per hour, average oxygen saturation of only 88% with a nadir of 67%, he had no significant PLMS. He was started on CPAP at 5 cm via nasal pillows and advanced 8 cm, and the final pressure his AHI was 2.2 per hour with nonsupine REM sleep achieved an O2 nadir of 89%. I suggested a home treatment pressure of 9 cm. Today, 10/20/2017: I reviewed his CPAP compliance data from 09/19/2017 through 10/18/2017 which is a total of 30 days, during which time he used his CPAP 29 days with percent used days greater than 4 hours at 97%, indicating excellent compliance with an average usage of 6 hours and 30 minutes, residual AHI at goal at 1.4 per hour, leak acceptable on the low side with the 95th percentile at 5.2 L/m on a pressure of 9 cm with EPR of 3. He reports feeling quite well, he feels much improved with regards to his sleep consolidation, sleep quality and daytime somnolence. He uses his machine while napping as well now. He has noticed an increase in dreams. He  had catheter ablation in April 2019 for his A. fib and subsequent cardioversion was needed also in later April 2019. He has seen Dr. Einar Gip in [place] and more recently Dr. Curt Bears. He was taken off his blood thinner. He is quite pleased with his outcome so far. He is very motivated to continue with treatment. He is using nasal pillows with success.  The patient's allergies, current medications, family history, past medical history, past social history, past surgical history and problem list were reviewed and updated as appropriate.   Previously:  03/10/2017: (He) reports snoring and excessive daytime somnolence. I reviewed your office note from 02/13/2017, which you kindly included. His Epworth sleepiness score is 20 out of 24 today, fatigue score is 58 out of 63. He reports loud snoring and also positive in his breathing per girlfriend report. He lives alone, girlfriend lives in [place]. He has one son and one daughter. His son lives with them along with his family which includes 2 children. Patient bedtime is around 9, rise time around 4. He has to be at work at 43. 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 He has nocturia about twice per average night. He has had occasional morning headaches. He drinks caffeine in the form of coffee, usually just one cup per day, typically no sodas. He quit smoking some 20 years ago. He does drink occasional wine, 1-2 glasses, namely on weekends, not daily. Of note, the patient  had a elective cardioversion on 03/04/2017. I reviewed the records. He was in sinus rhythm after his cardioversion.  His Past Medical History Is Significant For: Past Medical History:  Diagnosis Date  . Atrial fibrillation ([place])    a. 04/2017 s/p PVI.  Marland Kitchen Cataract    left   . GERD (gastroesophageal reflux disease)   . Sleep apnea    "waiting on my CPAP" (05/23/2017)    His Past Surgical History Is Significant For: Past Surgical History:  Procedure Laterality Date  . ATRIAL  FIBRILLATION ABLATION N/A 05/06/2017   Procedure: ATRIAL FIBRILLATION ABLATION;  Surgeon: Constance Haw, MD;  Location: [facility];  Service: Cardiovascular;  Laterality: N/A;  . CARDIOVERSION N/A 03/04/2017   Procedure: CARDIOVERSION;  Surgeon: Adrian Prows, MD;  Location: [facility];  Service: Cardiovascular;  Laterality: N/A;  . LEFT HEART CATH AND CORONARY ANGIOGRAPHY N/A 05/24/2017   Procedure: LEFT HEART CATH AND CORONARY ANGIOGRAPHY possible angioplasty;  Surgeon: Nigel Mormon, MD;  Location: [facility];  Service: Cardiovascular;  Laterality: N/A;  . WISDOM TOOTH EXTRACTION     3 removed - 1 remains    His Family History Is Significant For: Family History  Problem Relation Age of Onset  . Diabetes Mother   . Hyperlipidemia Mother   . Diabetes Father   . Hyperlipidemia Father   . Esophageal cancer Father   . Drug abuse Sister   . Hyperlipidemia Maternal Grandmother   . Colon cancer Neg Hx   . Pancreatic cancer Neg Hx   . Prostate cancer Neg Hx   . Rectal cancer Neg Hx   . Stomach cancer Neg Hx     His Social History Is Significant For: Social History   Socioeconomic History  . Marital status: Divorced    Spouse name: Not on file  . Number of children: Not on file  . Years of education: Not on file  . Highest education level: Not on file  Occupational History  . Not on file  Social Needs  . Financial resource strain: Not on file  . Food insecurity:    Worry: Not on file    Inability: Not on file  . Transportation needs:    Medical: Not on file    Non-medical: Not on file  Tobacco Use  . Smoking status: Former Smoker    Packs/day: 1.00    Years: 20.00    Pack years: 20.00    Types: Cigarettes    Last attempt to quit: 1998    Years since quitting: 21.7  . Smokeless tobacco: Never Used  Substance and Sexual Activity  . Alcohol use: Yes    Alcohol/week: 3.0 standard drinks    Types: 3 Glasses of wine per week  . Drug use: No  .  Sexual activity: Yes  Lifestyle  . Physical activity:    Days per week: Not on file    Minutes per session: Not on file  . Stress: Not on file  Relationships  . Social connections:    Talks on phone: Not on file    Gets together: Not on file    Attends religious service: Not on file    Active member of club or organization: Not on file    Attends meetings of clubs or organizations: Not on file    Relationship status: Not on file  Other Topics Concern  . Not on file  Social History Narrative   ** Merged History Encounter **  His Allergies Are:  No Known Allergies:   His Current Medications Are:  Outpatient Encounter Medications as of 10/20/2017  Medication Sig  . diltiazem (CARDIZEM CD) 120 MG 24 hr capsule Take 1 capsule (120 mg total) by mouth daily.  . flecainide (TAMBOCOR) 100 MG tablet Take 50 mg by mouth 2 (two) times daily.    No facility-administered encounter medications on file as of 10/20/2017.   :  Review of Systems:  Out of a complete 14 point review of systems, all are reviewed and negative with the exception of these symptoms as listed below:  Review of Systems  Neurological:       Pt presents today to discuss his cpap. Pt feels much better with PAP therapy.    Objective:  Neurological Exam  Physical Exam Physical Examination:   Vitals:   10/20/17 1505  BP: 139/77  Pulse: 67   General Examination: The patient is a very pleasant 57 y.o. male in no acute distress. He appears well-developed and well-nourished and well groomed.   HEENT: Normocephalic, atraumatic, pupils are equal, round and reactive to light and accommodation. He wears corrective eyeglasses. Extraocular tracking is good without limitation to gaze excursion or nystagmus noted. Normal smooth pursuit is noted. Hearing is grossly intact. Face is symmetric with normal facial animation and normal facial sensation. Speech is clear with no dysarthria noted. There is no hypophonia. There is  no lip, neck/head, jaw or voice tremor. Neck is supple with full range of passive and active motion. Oropharynx exam reveals: mild mouth dryness, good dental hygiene and moderate airway crowding. Tongue protrudes centrally and palate elevates symmetrically.  Chest: Clear to auscultation without wheezing, rhonchi or crackles noted.  Heart: S1+S2+0, regular, no murmurs, rubs or gallops noted.   Abdomen: Soft, non-tender and non-distended with normal bowel sounds appreciated on auscultation.  Extremities: There is no pitting edema in the distal lower extremities bilaterally.   Skin: Warm and dry without trophic changes noted.  Musculoskeletal: exam reveals no obvious joint deformities, tenderness or joint swelling or erythema.   Neurologically:  Mental status: The patient is awake, alert and oriented in all 4 spheres. His immediate and remote memory, attention, language skills and fund of knowledge are appropriate. There is no evidence of aphasia, agnosia, apraxia or anomia. Speech is clear with normal prosody and enunciation. Thought process is linear. Mood is normal and affect is normal.  Cranial nerves II - XII are as described above under HEENT exam.  Motor exam: Normal bulk, strength and tone is noted. There is no drift, tremor or rebound. Romberg is negative. Fine motor skills and coordination: grossly intact.  Cerebellar testing: No dysmetria or intention tremor. There is no truncal or gait ataxia.  Sensory exam: intact to light touch in the upper and lower extremities.  Gait, station and balance: He stands easily. No veering to one side is noted. No leaning to one side is noted. Posture is age-appropriate and stance is narrow based. Gait shows normal stride length and normal pace.  Assessment and Plan:  In summary, [facility] is a very pleasant 57 year old male with an underlying medical history of atrial fibrillation, hyperlipidemia, and obesity, who presents for  follow-up consultation of his obstructive sleep apnea which was determined to be severe by sleep study testing. He had a split-night sleep study on 04/21/2017. He did rather well with CPAP therapy. Baseline AHI was 38.6 per hour, worse in rem sleep. O2 nadir was 67%. He has established treatment  with CPAP of 9 cm via nasal pillows. He had interim catheter ablation for his A. Fib and also cardioversion in April 2019. He seems to be in sinus rhythm at this time. He is feeling improved. He was compliant with treatment and highly commended for this. He feels that he has benefited from CPAP therapy, noticed improvement in his sleep quality, sleep consolidation and daytime symptoms. He is advised to routinely follow-up in 6 months in our office for sleep apnea recheck, he can see one of our nurse practitioners at the time. If he continues to do well he can be seen on a yearly basis hopefully after that. I answered all his questions today and he was in agreement.  I spent 25 minutes in total face-to-face time with the patient, more than 50% of which was spent in counseling and coordination of care, reviewing test results, reviewing medication and discussing or reviewing the diagnosis of OSA, its prognosis and treatment options. Pertinent laboratory and imaging test results that were available during this visit with the patient were reviewed by me and considered in my medical decision making (see chart for details).

## 2017-10-24 ENCOUNTER — Encounter: Payer: BC Managed Care – PPO | Admitting: Emergency Medicine

## 2017-11-01 ENCOUNTER — Other Ambulatory Visit: Payer: Self-pay

## 2017-11-01 ENCOUNTER — Encounter: Payer: Self-pay | Admitting: Emergency Medicine

## 2017-11-01 ENCOUNTER — Ambulatory Visit (INDEPENDENT_AMBULATORY_CARE_PROVIDER_SITE_OTHER): Payer: BC Managed Care – PPO | Admitting: Emergency Medicine

## 2017-11-01 VITALS — BP 117/62 | HR 71 | Temp 98.5°F | Resp 16 | Ht 67.0 in | Wt 222.4 lb

## 2017-11-01 DIAGNOSIS — Z0000 Encounter for general adult medical examination without abnormal findings: Secondary | ICD-10-CM

## 2017-11-01 DIAGNOSIS — Z8679 Personal history of other diseases of the circulatory system: Secondary | ICD-10-CM | POA: Diagnosis not present

## 2017-11-01 DIAGNOSIS — Z9989 Dependence on other enabling machines and devices: Secondary | ICD-10-CM

## 2017-11-01 DIAGNOSIS — G4733 Obstructive sleep apnea (adult) (pediatric): Secondary | ICD-10-CM | POA: Insufficient documentation

## 2017-11-01 DIAGNOSIS — N529 Male erectile dysfunction, unspecified: Secondary | ICD-10-CM | POA: Diagnosis not present

## 2017-11-01 DIAGNOSIS — Z8669 Personal history of other diseases of the nervous system and sense organs: Secondary | ICD-10-CM | POA: Diagnosis not present

## 2017-11-01 MED ORDER — SILDENAFIL CITRATE 100 MG PO TABS: 50.0000 mg | ORAL_TABLET | Freq: Every day | ORAL | 11 refills | Status: DC | PRN

## 2017-11-01 NOTE — Patient Instructions (Addendum)
   If you have lab work done today you will be contacted with your lab results within the next 2 weeks.  If you have not heard from us then please contact us. The fastest way to get your results is to register for My Chart.   IF you received an x-ray today, you will receive an invoice from [facility]. Please contact [facility] at [phone] with questions or concerns regarding your invoice.   IF you received labwork today, you will receive an invoice from [facility]. Please contact [facility] at [phone] with questions or concerns regarding your invoice.   Our billing staff will not be able to assist you with questions regarding bills from these companies.  You will be contacted with the lab results as soon as they are available. The fastest way to get your results is to activate your My Chart account. Instructions are located on the last page of this paperwork. If you have not heard from us regarding the results in 2 weeks, please contact this office.      Health Maintenance, Male A healthy lifestyle and preventive care is important for your health and wellness. Ask your health care provider about what schedule of regular examinations is right for you. What should I know about weight and diet? Eat a Healthy Diet  Eat plenty of vegetables, fruits, whole grains, low-fat dairy products, and lean protein.  Do not eat a lot of foods high in solid fats, added sugars, or salt.  Maintain a Healthy Weight Regular exercise can help you achieve or maintain a healthy weight. You should:  Do at least 150 minutes of exercise each week. The exercise should increase your heart rate and make you sweat (moderate-intensity exercise).  Do strength-training exercises at least twice a week.  Watch Your Levels of Cholesterol and Blood Lipids  Have your blood tested for lipids and cholesterol every 5 years starting at 58 years of age. If you are at high risk for heart disease, you  should start having your blood tested when you are 58 years old. You may need to have your cholesterol levels checked more often if: ? Your lipid or cholesterol levels are high. ? You are older than 58 years of age. ? You are at high risk for heart disease.  What should I know about cancer screening? Many types of cancers can be detected early and may often be prevented. Lung Cancer  You should be screened every year for lung cancer if: ? You are a current smoker who has smoked for at least 30 years. ? You are a former smoker who has quit within the past 15 years.  Talk to your health care provider about your screening options, when you should start screening, and how often you should be screened.  Colorectal Cancer  Routine colorectal cancer screening usually begins at 58 years of age and should be repeated every 5-10 years until you are 58 years old. You may need to be screened more often if early forms of precancerous polyps or small growths are found. Your health care provider may recommend screening at an earlier age if you have risk factors for colon cancer.  Your health care provider may recommend using home test kits to check for hidden blood in the stool.  A small camera at the end of a tube can be used to examine your colon (sigmoidoscopy or colonoscopy). This checks for the earliest forms of colorectal cancer.  Prostate and Testicular Cancer  Depending   on your age and overall health, your health care provider may do certain tests to screen for prostate and testicular cancer.  Talk to your health care provider about any symptoms or concerns you have about testicular or prostate cancer.  Skin Cancer  Check your skin from head to toe regularly.  Tell your health care provider about any new moles or changes in moles, especially if: ? There is a change in a mole's size, shape, or color. ? You have a mole that is larger than a pencil eraser.  Always use sunscreen. Apply  sunscreen liberally and repeat throughout the day.  Protect yourself by wearing long sleeves, pants, a wide-brimmed hat, and sunglasses when outside.  What should I know about heart disease, diabetes, and high blood pressure?  If you are 18-39 years of age, have your blood pressure checked every 3-5 years. If you are 40 years of age or older, have your blood pressure checked every year. You should have your blood pressure measured twice-once when you are at a hospital or clinic, and once when you are not at a hospital or clinic. Record the average of the two measurements. To check your blood pressure when you are not at a hospital or clinic, you can use: ? An automated blood pressure machine at a pharmacy. ? A home blood pressure monitor.  Talk to your health care provider about your target blood pressure.  If you are between 45-79 years old, ask your health care provider if you should take aspirin to prevent heart disease.  Have regular diabetes screenings by checking your fasting blood sugar level. ? If you are at a normal weight and have a low risk for diabetes, have this test once every three years after the age of 45. ? If you are overweight and have a high risk for diabetes, consider being tested at a younger age or more often.  A one-time screening for abdominal aortic aneurysm (AAA) by ultrasound is recommended for men aged 65-75 years who are current or former smokers. What should I know about preventing infection? Hepatitis B If you have a higher risk for hepatitis B, you should be screened for this virus. Talk with your health care provider to find out if you are at risk for hepatitis B infection. Hepatitis C Blood testing is recommended for:  Everyone born from 1945 through 1965.  Anyone with known risk factors for hepatitis C.  Sexually Transmitted Diseases (STDs)  You should be screened each year for STDs including gonorrhea and chlamydia if: ? You are sexually active  and are younger than 58 years of age. ? You are older than 58 years of age and your health care provider tells you that you are at risk for this type of infection. ? Your sexual activity has changed since you were last screened and you are at an increased risk for chlamydia or gonorrhea. Ask your health care provider if you are at risk.  Talk with your health care provider about whether you are at high risk of being infected with HIV. Your health care provider may recommend a prescription medicine to help prevent HIV infection.  What else can I do?  Schedule regular health, dental, and eye exams.  Stay current with your vaccines (immunizations).  Do not use any tobacco products, such as cigarettes, chewing tobacco, and e-cigarettes. If you need help quitting, ask your health care provider.  Limit alcohol intake to no more than 2 drinks per day. One drink equals   12 ounces of beer, 5 ounces of wine, or 1 ounces of hard liquor.  Do not use street drugs.  Do not share needles.  Ask your health care provider for help if you need support or information about quitting drugs.  Tell your health care provider if you often feel depressed.  Tell your health care provider if you have ever been abused or do not feel safe at home. This information is not intended to replace advice given to you by your health care provider. Make sure you discuss any questions you have with your health care provider. Document Released: 07/17/2007 Document Revised: 09/17/2015 Document Reviewed: 10/22/2014 Elsevier Interactive Patient Education  2018 [place] en los hombres (Health Maintenance, Male) Un estilo de vida saludable y los cuidados preventivos son importantes para la salud y Musician. Pregntele al mdico cul es el cronograma de exmenes peridicos adecuado para usted. [place]? Consuma una dieta saludable  Coma muchas verduras,  frutas, cereales integrales, productos lcteos con bajo contenido de grasa y Advertising account planner.  No consuma muchos alimentos de alto contenido de grasas slidas, azcares agregados o sal. Mantenga un peso saludable La actividad fsica habitual puede ayudarlo a Science writer o mantener un peso saludable. Deber hacer lo siguiente:  Realizar al menos 170minutos de actividad fsica por semana. El ejercicio debe aumentar la frecuencia cardaca y Actor la transpiracin (ejercicio de [place]).  Hacer ejercicios de entrenamiento de fuerza por lo [employer] por semana. Controlarse los niveles de colesterol y lpidos en la sangre  Hgase anlisis de sangre para controlar los lpidos y el colesterol cada 5aos a partir de los 35aos. Si tiene un riesgo alto de Best boy cardiopatas coronarias, debe comenzar a [employer] de [place] a los [place]. Es posible que Automotive engineer los niveles de colesterol con mayor frecuencia si: ? Sus niveles de lpidos y colesterol son altos. ? Es mayor de 68TMH. ? Tiene un riesgo alto de tener cardiopatas coronarias. QU DEBO SABER SOBRE LAS PRUEBAS DE [place]? Muchos tipos de cncer se pueden detectar de manera temprana y a menudo prevenirse. Cncer de pulmn  Debe someterse a pruebas de deteccin de cncer de pulmn todos los aos en los siguientes casos: ? Si fuma actualmente y lo ha hecho durante por lo menos 30aos. ? Si fue fumador que dej el hbito en el trmino de los ltimos 15aos.  Hable con el mdico sobre las opciones en relacin con los estudios de deteccin, cundo debe comenzar a Insurance underwriter y con Armed forces operational officer. Cncer colorrectal  Generalmente, las pruebas de deteccin habituales del cncer colorrectal comienzan a los 50aos y deben repetirse cada 5 a 10aos hasta los 75aos. Es posible que tenga que hacerse las pruebas con mayor frecuencia si se detectan formas tempranas de plipos precancerosos o pequeos  bultos. Sin embargo, el mdico podr aconsejarle que lo haga antes, si tiene factores de riesgo para el cncer de colon.  El mdico puede recomendarle que use kits de prueba caseros para Publishing rights manager oculta en la materia fecal.  Se puede usar una pequea cmara en el extremo de un tubo para examinar el colon (sigmoidoscopia o colonoscopia). Este estudio [employer] formas ms tempranas de Surveyor, minerals. Cncer de prstata y de testculo  En funcin de la edad y del [place] de salud general, el mdico puede realizarle determinados estudios de deteccin del cncer de prstata y de testculo.  Hable  con el mdico sobre cualquier sntoma o acerca de las inquietudes que tenga sobre el cncer de prstata o de testculo. Cncer de piel  Revise la piel de la cabeza a los pies con regularidad.  Informe al mdico si aparecen nuevos lunares o si nota cambios en los que ya tiene, especialmente en estos casos: ? Si hay un cambio en el tamao, la forma o el color del lunar. ? Si tiene un lunar que es ms grande que el tamao de una goma de Games developer.  Siempre use pantalla solar. Aplquese pantalla solar de Lanell Matar y repetida a lo largo del Training and development officer.  Use mangas y [employer], un sombrero de ala ancha y gafas para el sol cuando est al Alexis Goodell, para protegerse. QU DEBO SABER SOBRE LAS CARDIOPATAS CORONARIAS, LA DIABETES Y LA HIPERTENSIN ARTERIAL?  Si usted tiene entre 18 y 25aos, debe medirse la presin arterial cada 3a 5aos. Si usted tiene 40aos o ms, debe medirse la presin arterial [employer]. Debe medirse la presin arterial dos veces: una vez cuando est en un hospital o una clnica y la otra vez cuando est en otro sitio. Registre el promedio de [employer]. Para controlar su presin arterial cuando no est en un hospital o [place], puede usar lo siguiente: ? Jorje Guild automtica para medir la presin arterial en una farmacia. ? Un monitor para medir la  presin arterial en el hogar.  Hable con el mdico [employer] ideales de la presin arterial.  Si tiene entre 63 y 79aos, consltele al mdico si debe tomar aspirina para evitar las cardiopatas coronarias.  Hgase anlisis habituales de deteccin de la diabetes; para ello, contrlese la glucemia en ayunas. ? Si su peso es normal y tiene un bajo riesgo de padecer diabetes, realcese este anlisis cada tres aos despus de los 45aos. ? Si tiene sobrepeso y un alto riesgo de padecer diabetes, considere someterse a este anlisis antes o con mayor frecuencia.  Para los hombres que tienen entre 35 y 58aos, y son o han sido fumadores, se recomienda un nico estudio con ecografa para Hydrographic surveyor un aneurisma de aorta abdominal (AAA). QU DEBO SABER SOBRE LA PREVENCIN DE LAS INFECCIONES? HepatitisB Si tiene un riesgo ms alto de Museum/gallery curator hepatitis B, debe someterse a un examen de deteccin de este virus. Hable con el mdico para determinar si corre riesgo de tener una infeccin por hepatitisB. Hepatitis C Se recomienda un anlisis de [employer] para:  Todos los que nacieron entre 1945 y [phone].  Todas las personas que tengan un riesgo de haber contrado hepatitis C. Enfermedades de transmisin sexual (ETS)  Debe realizarse pruebas de Programme researcher, broadcasting/film/video de las ETS todos los aos, incluidas la gonorrea y la clamidia, en estos casos: ? Es sexualmente activo y es menor de [place]. ? Es mayor de 24aos, y Investment banker, operational informa que corre riesgo de tener este tipo de infecciones. ? La actividad sexual ha cambiado desde que le hicieron la ltima prueba de deteccin y tiene un riesgo mayor de Best boy clamidia o Radio broadcast assistant. Pregntele al mdico si usted tiene riesgo.  Consulte a su mdico para saber si tiene un alto riesgo de infectarse por el VIH. El mdico puede recomendarle un medicamento de venta con receta para ayudar a evitar la infeccin por el VIH. QU MS PUEDO HACER?  Realcese los estudios de  rutina de la salud, dentales y de Public librarian.  [place] (inmunizaciones).  No consuma  ningn producto que contenga tabaco, lo que incluye cigarrillos, tabaco de Higher education careers adviser y Psychologist, sport and exercise. Si necesita ayuda para dejar de fumar, consulte al mdico.  Limite el consumo de alcohol a no ms de 56medidas por da. [employer] a 12 onzas de cerveza, 5onzas de vino o 1onzas de bebidas alcohlicas de alta graduacin.  No consuma drogas.  No comparta agujas.  Solicite ayuda a su mdico si necesita apoyo o informacin para abandonar las drogas.  Informe a su mdico si a menudo se siente deprimido.  Notifique a su mdico si alguna vez ha sido vctima de abuso o si no se siente seguro en su hogar. Esta informacin no tiene Marine scientist el consejo del mdico. Asegrese de hacerle al mdico cualquier pregunta que tenga. Document Released: 07/17/2007 Document Revised: 02/08/2014 Document Reviewed: 10/22/2014 Elsevier Interactive Patient Education  [employer].

## 2017-11-01 NOTE — Progress Notes (Addendum)
[facility] [name] 57 y.o.   Chief Complaint  Patient presents with  . Annual Exam    HISTORY OF PRESENT ILLNESS: This is a 57 y.o. male Here for annual exam. Past medical history includes atrial fibrillation status post ablation earlier this year.  Presently on Cardizem and Tambocor.  Doing well.  Not on anticoagulants at present time.  Felt that it was secondary to sleep apnea.  Presently on CPAP treatment.  Doing well. Also has a history of GERD. Also complaining of erectile dysfunction. Also complaining of occasional intermittent lower leg swelling.  None now. No other significant medical history   HPI   Prior to Admission medications   Medication Sig Start Date End Date Taking? Authorizing Provider  diltiazem (CARDIZEM CD) 120 MG 24 hr capsule Take 1 capsule (120 mg total) by mouth daily. 10/04/17 01/02/18 Yes Camnitz, Will Hassell Done, MD  flecainide (TAMBOCOR) 100 MG tablet Take 50 mg by mouth 2 (two) times daily.    Yes [provider]  sildenafil (VIAGRA) 100 MG tablet Take 0.5-1 tablets (50-100 mg total) by mouth daily as needed for erectile dysfunction. 11/01/17   Horald Pollen, MD    No Known Allergies  Patient Active Problem List   Diagnosis Date Noted  . Paroxysmal A-fib ([place]) 05/06/2017  . Paroxysmal atrial fibrillation ([place]) 03/03/2017    Past Medical History:  Diagnosis Date  . Atrial fibrillation ([place])    a. 04/2017 s/p PVI.  Marland Kitchen Cataract    left   . GERD (gastroesophageal reflux disease)   . Sleep apnea    "waiting on my CPAP" (05/23/2017)    Past Surgical History:  Procedure Laterality Date  . ATRIAL FIBRILLATION ABLATION N/A 05/06/2017   Procedure: ATRIAL FIBRILLATION ABLATION;  Surgeon: Constance Haw, MD;  Location: [facility];  Service: Cardiovascular;  Laterality: N/A;  . CARDIOVERSION N/A 03/04/2017   Procedure: CARDIOVERSION;  Surgeon: Adrian Prows, MD;  Location: [facility];  Service: Cardiovascular;  Laterality:  N/A;  . LEFT HEART CATH AND CORONARY ANGIOGRAPHY N/A 05/24/2017   Procedure: LEFT HEART CATH AND CORONARY ANGIOGRAPHY possible angioplasty;  Surgeon: Nigel Mormon, MD;  Location: [facility];  Service: Cardiovascular;  Laterality: N/A;  . WISDOM TOOTH EXTRACTION     3 removed - 1 remains    Social History   Socioeconomic History  . Marital status: Divorced    Spouse name: Not on file  . Number of children: Not on file  . Years of education: Not on file  . Highest education level: Not on file  Occupational History  . Not on file  Social Needs  . Financial resource strain: Not on file  . Food insecurity:    Worry: Not on file    Inability: Not on file  . Transportation needs:    Medical: Not on file    Non-medical: Not on file  Tobacco Use  . Smoking status: Former Smoker    Packs/day: 1.00    Years: 20.00    Pack years: 20.00    Types: Cigarettes    Last attempt to quit: 1998    Years since quitting: 21.7  . Smokeless tobacco: Never Used  Substance and Sexual Activity  . Alcohol use: Yes    Alcohol/week: 3.0 standard drinks    Types: 3 Glasses of wine per week  . Drug use: No  . Sexual activity: Yes  Lifestyle  . Physical activity:    Days per week: Not on file  Minutes per session: Not on file  . Stress: Not on file  Relationships  . Social connections:    Talks on phone: Not on file    Gets together: Not on file    Attends religious service: Not on file    Active member of club or organization: Not on file    Attends meetings of clubs or organizations: Not on file    Relationship status: Not on file  . Intimate partner violence:    Fear of current or ex partner: Not on file    Emotionally abused: Not on file    Physically abused: Not on file    Forced sexual activity: Not on file  Other Topics Concern  . Not on file  Social History Narrative   ** Merged History Encounter **        Family History  Problem Relation Age of Onset  .  Diabetes Mother   . Hyperlipidemia Mother   . Diabetes Father   . Hyperlipidemia Father   . Esophageal cancer Father   . Drug abuse Sister   . Hyperlipidemia Maternal Grandmother   . Colon cancer Neg Hx   . Pancreatic cancer Neg Hx   . Prostate cancer Neg Hx   . Rectal cancer Neg Hx   . Stomach cancer Neg Hx      Review of Systems  Constitutional: Negative.  Negative for chills, fever and weight loss.  HENT: Negative.  Negative for sore throat.   Eyes: Negative.  Negative for blurred vision and double vision.  Respiratory: Negative.  Negative for cough and shortness of breath.   Cardiovascular: Positive for leg swelling. Negative for chest pain and palpitations.  Gastrointestinal: Negative.  Negative for abdominal pain, blood in stool, diarrhea, melena, nausea and vomiting.  Skin: Negative.  Negative for rash.  Neurological: Negative.  Negative for dizziness and headaches.  Endo/Heme/Allergies: Negative.   All other systems reviewed and are negative.  Vitals:   11/01/17 1612  BP: 117/62  Pulse: 71  Resp: 16  Temp: 98.5 F (36.9 C)  SpO2: 97%     Physical Exam  Constitutional: He is oriented to person, place, and time. He appears well-developed and well-nourished.  HENT:  Head: Normocephalic and atraumatic.  Right Ear: External ear normal.  Left Ear: External ear normal.  Nose: Nose normal.  Mouth/Throat: Oropharynx is clear and moist.  Eyes: Pupils are equal, round, and reactive to light. Conjunctivae and EOM are normal.  Neck: Normal range of motion. Neck supple. No JVD present.  Cardiovascular: Normal rate, regular rhythm, normal heart sounds and intact distal pulses.  Pulmonary/Chest: Effort normal and breath sounds normal.  Abdominal: Soft. Bowel sounds are normal. He exhibits no distension. There is no tenderness.  Musculoskeletal: Normal range of motion. He exhibits no edema or tenderness.  Lymphadenopathy:    He has no cervical adenopathy.  Neurological:  He is alert and oriented to person, place, and time. No sensory deficit. He exhibits normal muscle tone. Coordination normal.  Skin: Skin is warm and dry. Capillary refill takes less than 2 seconds.  Psychiatric: He has a normal mood and affect. His behavior is normal.  Vitals reviewed.  The 10-year ASCVD risk score Mikey Bussing DC Jr., et al., 2013) is: 5.6%   Values used to calculate the score:     Age: 43 years     Sex: Male     Is Non-Hispanic African American: No     Diabetic: No     Tobacco  smoker: No     Systolic Blood Pressure: 161 mmHg     Is BP treated: No     HDL Cholesterol: 45 mg/dL     Total Cholesterol: 166 mg/dL   ASSESSMENT & PLAN: Maximillian was seen today for annual exam.  Diagnoses and all orders for this visit:  Routine general medical examination at a health care facility -     CBC with Differential/Platelet; Future -     Comprehensive metabolic panel; Future -     Hemoglobin A1c; Future -     Lipid panel; Future  Erectile dysfunction, unspecified erectile dysfunction type -     sildenafil (VIAGRA) 100 MG tablet; Take 0.5-1 tablets (50-100 mg total) by mouth daily as needed for erectile dysfunction.  History of atrial fibrillation  History of sleep apnea    Patient Instructions       If you have lab work done today you will be contacted with your lab results within the next 2 weeks.  If you have not heard from [place] then please contact us. The fastest way to get your results is to register for My Chart.   IF you received an x-ray today, you will receive an invoice from [facility]. Please contact [facility] at [phone] with questions or concerns regarding your invoice.   IF you received labwork today, you will receive an invoice from [place]. Please contact [facility] at [phone] with questions or concerns regarding your invoice.   Our billing staff will not be able to assist you with questions regarding bills from these  companies.  You will be contacted with the lab results as soon as they are available. The fastest way to get your results is to activate your My Chart account. Instructions are located on the last page of this paperwork. If you have not heard from [place] regarding the results in 2 weeks, please contact this office.      Health Maintenance, Male A healthy lifestyle and preventive care is important for your health and wellness. Ask your health care provider about what schedule of regular examinations is right for you. What should I know about weight and diet? Eat a Healthy Diet  Eat plenty of vegetables, fruits, whole grains, low-fat dairy products, and lean protein.  Do not eat a lot of foods high in solid fats, added sugars, or salt.  Maintain a Healthy Weight Regular exercise can help you achieve or maintain a healthy weight. You should:  Do at least 150 minutes of exercise each week. The exercise should increase your heart rate and make you sweat (moderate-intensity exercise).  Do strength-training exercises at least twice a week.  Watch Your Levels of Cholesterol and Blood Lipids  Have your blood tested for lipids and cholesterol every 5 years starting at 58 years of age. If you are at high risk for heart disease, you should start having your blood tested when you are 58 years old. You may need to have your cholesterol levels checked more often if: ? Your lipid or cholesterol levels are high. ? You are older than 58 years of age. ? You are at high risk for heart disease.  What should I know about cancer screening? Many types of cancers can be detected early and may often be prevented. Lung Cancer  You should be screened every year for lung cancer if: ? You are a current smoker who has smoked for at least 30 years. ? You are a former smoker who has quit within  the past 15 years.  Talk to your health care provider about your screening options, when you should start screening, and  how often you should be screened.  Colorectal Cancer  Routine colorectal cancer screening usually begins at 58 years of age and should be repeated every 5-10 years until you are 58 years old. You may need to be screened more often if early forms of precancerous polyps or small growths are found. Your health care provider may recommend screening at an earlier age if you have risk factors for colon cancer.  Your health care provider may recommend using home test kits to check for hidden blood in the stool.  A small camera at the end of a tube can be used to examine your colon (sigmoidoscopy or colonoscopy). This checks for the earliest forms of colorectal cancer.  Prostate and Testicular Cancer  Depending on your age and overall health, your health care provider may do certain tests to screen for prostate and testicular cancer.  Talk to your health care provider about any symptoms or concerns you have about testicular or prostate cancer.  Skin Cancer  Check your skin from head to toe regularly.  Tell your health care provider about any new moles or changes in moles, especially if: ? There is a change in a mole's size, shape, or color. ? You have a mole that is larger than a pencil eraser.  Always use sunscreen. Apply sunscreen liberally and repeat throughout the day.  Protect yourself by wearing long sleeves, pants, a wide-brimmed hat, and sunglasses when outside.  What should I know about heart disease, diabetes, and high blood pressure?  If you are 61-68 years of age, have your blood pressure checked every 3-5 years. If you are 67 years of age or older, have your blood pressure checked every year. You should have your blood pressure measured twice-once when you are at a hospital or clinic, and once when you are not at a hospital or clinic. Record the average of the two measurements. To check your blood pressure when you are not at a hospital or clinic, you can use: ? An automated blood  pressure machine at a pharmacy. ? A home blood pressure monitor.  Talk to your health care provider about your target blood pressure.  If you are between 42-21 years old, ask your health care provider if you should take aspirin to prevent heart disease.  Have regular diabetes screenings by checking your fasting blood sugar level. ? If you are at a normal weight and have a low risk for diabetes, have this test once every three years after the age of 36. ? If you are overweight and have a high risk for diabetes, consider being tested at a younger age or more often.  A one-time screening for abdominal aortic aneurysm (AAA) by ultrasound is recommended for men aged 70-75 years who are current or former smokers. What should I know about preventing infection? Hepatitis B If you have a higher risk for hepatitis B, you should be screened for this virus. Talk with your health care provider to find out if you are at risk for hepatitis B infection. Hepatitis C Blood testing is recommended for:  Everyone born from [birth year] through 1966.  Anyone with known risk factors for hepatitis C.  Sexually Transmitted Diseases (STDs)  You should be screened each year for STDs including gonorrhea and chlamydia if: ? You are sexually active and are younger than 58 years of age. ? You are  older than 58 years of age and your health care provider tells you that you are at risk for this type of infection. ? Your sexual activity has changed since you were last screened and you are at an increased risk for chlamydia or gonorrhea. Ask your health care provider if you are at risk.  Talk with your health care provider about whether you are at high risk of being infected with HIV. Your health care provider may recommend a prescription medicine to help prevent HIV infection.  What else can I do?  Schedule regular health, dental, and eye exams.  Stay current with your vaccines (immunizations).  Do not use any tobacco  products, such as cigarettes, chewing tobacco, and e-cigarettes. If you need help quitting, ask your health care provider.  Limit alcohol intake to no more than 2 drinks per day. One drink equals 12 ounces of beer, 5 ounces of wine, or 1 ounces of hard liquor.  Do not use street drugs.  Do not share needles.  Ask your health care provider for help if you need support or information about quitting drugs.  Tell your health care provider if you often feel depressed.  Tell your health care provider if you have ever been abused or do not feel safe at home. This information is not intended to replace advice given to you by your health care provider. Make sure you discuss any questions you have with your health care provider. Document Released: 07/17/2007 Document Revised: 09/17/2015 Document Reviewed: 10/22/2014 Elsevier Interactive Patient Education  2018 [place] en los hombres (Health Maintenance, Male) Un estilo de vida saludable y los cuidados preventivos son importantes para la salud y Musician. Pregntele al mdico cul es el cronograma de exmenes peridicos adecuado para usted. [place]? Consuma una dieta saludable  Coma muchas verduras, frutas, cereales integrales, productos lcteos con bajo contenido de grasa y Advertising account planner.  No consuma muchos alimentos de alto contenido de grasas slidas, azcares agregados o sal. Mantenga un peso saludable La actividad fsica habitual puede ayudarlo a Science writer o mantener un peso saludable. Deber hacer lo siguiente:  Realizar al menos 131minutos de actividad fsica por semana. El ejercicio debe aumentar la frecuencia cardaca y Actor la transpiracin (ejercicio de [place]).  Hacer ejercicios de entrenamiento de fuerza por lo [employer] por semana. Controlarse los niveles de colesterol y lpidos en la sangre  Hgase anlisis de sangre para  controlar los lpidos y el colesterol cada 5aos a partir de los 35aos. Si tiene un riesgo alto de Best boy cardiopatas coronarias, debe comenzar a [employer] de [place] a los [place]. Es posible que Automotive engineer los niveles de colesterol con mayor frecuencia si: ? Sus niveles de lpidos y colesterol son altos. ? Es mayor de 75FFM. ? Tiene un riesgo alto de tener cardiopatas coronarias. QU DEBO SABER SOBRE LAS PRUEBAS DE [place]? Muchos tipos de cncer se pueden detectar de manera temprana y a menudo prevenirse. Cncer de pulmn  Debe someterse a pruebas de deteccin de cncer de pulmn todos los aos en los siguientes casos: ? Si fuma actualmente y lo ha hecho durante por lo menos 30aos. ? Si fue fumador que dej el hbito en el trmino de los ltimos 15aos.  Hable con el mdico sobre las opciones en relacin con los estudios de deteccin, cundo debe comenzar a Insurance underwriter y con Armed forces operational officer. Cncer colorrectal  Generalmente,  las pruebas de deteccin habituales del cncer colorrectal comienzan a los 50aos y deben repetirse cada 5 a 10aos hasta los 47aos. Es posible que tenga que hacerse las pruebas con mayor frecuencia si se detectan formas tempranas de plipos precancerosos o pequeos bultos. Sin embargo, el mdico podr aconsejarle que lo haga antes, si tiene factores de riesgo para el cncer de colon.  El mdico puede recomendarle que use kits de prueba caseros para Publishing rights manager oculta en la materia fecal.  Se puede usar una pequea cmara en el extremo de un tubo para examinar el colon (sigmoidoscopia o colonoscopia). Este estudio [employer] formas ms tempranas de Surveyor, minerals. Cncer de prstata y de testculo  En funcin de la edad y del [place] de salud general, el mdico puede realizarle determinados estudios de deteccin del cncer de prstata y de testculo.  Hable con el mdico sobre cualquier sntoma o acerca de las inquietudes  que tenga sobre el cncer de prstata o de testculo. Cncer de piel  Revise la piel de la cabeza a los pies con regularidad.  Informe al mdico si aparecen nuevos lunares o si nota cambios en los que ya tiene, especialmente en estos casos: ? Si hay un cambio en el tamao, la forma o el color del lunar. ? Si tiene un lunar que es ms grande que el tamao de una goma de Games developer.  Siempre use pantalla solar. Aplquese pantalla solar de Lanell Matar y repetida a lo largo del Training and development officer.  Use mangas y [employer], un sombrero de ala ancha y gafas para el sol cuando est al Alexis Goodell, para protegerse. QU DEBO SABER SOBRE LAS CARDIOPATAS CORONARIAS, LA DIABETES Y LA HIPERTENSIN ARTERIAL?  Si usted tiene entre 18 y 32aos, debe medirse la presin arterial cada 3a 5aos. Si usted tiene 40aos o ms, debe medirse la presin arterial [employer]. Debe medirse la presin arterial dos veces: una vez cuando est en un hospital o una clnica y la otra vez cuando est en otro sitio. Registre el promedio de [employer]. Para controlar su presin arterial cuando no est en un hospital o [place], puede usar lo siguiente: ? Jorje Guild automtica para medir la presin arterial en una farmacia. ? Un monitor para medir la presin arterial en el hogar.  Hable con el mdico [employer] ideales de la presin arterial.  Si tiene entre 32 y 79aos, consltele al mdico si debe tomar aspirina para evitar las cardiopatas coronarias.  Hgase anlisis habituales de deteccin de la diabetes; para ello, contrlese la glucemia en ayunas. ? Si su peso es normal y tiene un bajo riesgo de padecer diabetes, realcese este anlisis cada tres aos despus de los 45aos. ? Si tiene sobrepeso y un alto riesgo de padecer diabetes, considere someterse a este anlisis antes o con mayor frecuencia.  Para los hombres que tienen entre 79 y 62aos, y son o han sido fumadores, se recomienda un nico  estudio con ecografa para Hydrographic surveyor un aneurisma de aorta abdominal (AAA). QU DEBO SABER SOBRE LA PREVENCIN DE LAS INFECCIONES? HepatitisB Si tiene un riesgo ms alto de Museum/gallery curator hepatitis B, debe someterse a un examen de deteccin de este virus. Hable con el mdico para determinar si corre riesgo de tener una infeccin por hepatitisB. Hepatitis C Se recomienda un anlisis de [place] para:  Todos los que nacieron entre 1945 y [phone].  Todas las personas que tengan un riesgo de haber contrado hepatitis C. Enfermedades de transmisin  sexual (ETS)  Debe realizarse pruebas de deteccin de las ETS todos los aos, incluidas la gonorrea y la clamidia, en estos casos: ? Es sexualmente activo y es menor de [place]. ? Es mayor de 24aos, y Investment banker, operational informa que corre riesgo de tener este tipo de infecciones. ? La actividad sexual ha cambiado desde que le hicieron la ltima prueba de deteccin y tiene un riesgo mayor de Best boy clamidia o Radio broadcast assistant. Pregntele al mdico si usted tiene riesgo.  Consulte a su mdico para saber si tiene un alto riesgo de infectarse por el VIH. El mdico puede recomendarle un medicamento de venta con receta para ayudar a evitar la infeccin por el VIH. QU MS PUEDO HACER?  Realcese los estudios de rutina de la salud, dentales y de Public librarian.  Hico (inmunizaciones).  No consuma ningn producto que contenga tabaco, lo que incluye cigarrillos, tabaco de Higher education careers adviser y Psychologist, sport and exercise. Si necesita ayuda para dejar de fumar, consulte al mdico.  Limite el consumo de alcohol a no ms de 47medidas por da. [employer] a 12 onzas de cerveza, 5onzas de vino o 1onzas de bebidas alcohlicas de alta graduacin.  No consuma drogas.  No comparta agujas.  Solicite ayuda a su mdico si necesita apoyo o informacin para abandonar las drogas.  Informe a su mdico si a menudo se siente deprimido.  Notifique a su mdico si alguna vez  ha sido vctima de abuso o si no se siente seguro en su hogar. Esta informacin no tiene Marine scientist el consejo del mdico. Asegrese de hacerle al mdico cualquier pregunta que tenga. Document Released: 07/17/2007 Document Revised: 02/08/2014 Document Reviewed: 10/22/2014 Elsevier Interactive Patient Education  2018 Elsevier Inc.      Agustina Caroli, MD [facility]

## 2017-11-16 ENCOUNTER — Ambulatory Visit (INDEPENDENT_AMBULATORY_CARE_PROVIDER_SITE_OTHER): Payer: BC Managed Care – PPO | Admitting: Emergency Medicine

## 2017-11-16 DIAGNOSIS — Z0000 Encounter for general adult medical examination without abnormal findings: Secondary | ICD-10-CM

## 2017-11-16 NOTE — Progress Notes (Signed)
Labs only visit

## 2017-11-17 LAB — CBC WITH DIFFERENTIAL/PLATELET
BASOS ABS: 0.1 10*3/uL (ref 0.0–0.2)
BASOS: 1 %
EOS (ABSOLUTE): 0 10*3/uL (ref 0.0–0.4)
EOS: 1 %
HEMATOCRIT: 45.3 % (ref 37.5–51.0)
HEMOGLOBIN: 15.2 g/dL (ref 13.0–17.7)
Immature Grans (Abs): 0 10*3/uL (ref 0.0–0.1)
Immature Granulocytes: 0 %
LYMPHS ABS: 2.1 10*3/uL (ref 0.7–3.1)
Lymphs: 34 %
MCH: 33 pg (ref 26.6–33.0)
MCHC: 33.6 g/dL (ref 31.5–35.7)
MCV: 99 fL — AB (ref 79–97)
MONOCYTES: 8 %
MONOS ABS: 0.5 10*3/uL (ref 0.1–0.9)
NEUTROS ABS: 3.4 10*3/uL (ref 1.4–7.0)
Neutrophils: 56 %
Platelets: 278 10*3/uL (ref 150–450)
RBC: 4.6 x10E6/uL (ref 4.14–5.80)
RDW: 12.7 % (ref 12.3–15.4)
WBC: 6.1 10*3/uL (ref 3.4–10.8)

## 2017-11-17 LAB — COMPREHENSIVE METABOLIC PANEL
ALBUMIN: 4.4 g/dL (ref 3.5–5.5)
ALT: 36 IU/L (ref 0–44)
AST: 29 IU/L (ref 0–40)
Albumin/Globulin Ratio: 1.4 (ref 1.2–2.2)
Alkaline Phosphatase: 51 IU/L (ref 39–117)
BUN / CREAT RATIO: 11 (ref 9–20)
BUN: 13 mg/dL (ref 6–24)
Bilirubin Total: 0.6 mg/dL (ref 0.0–1.2)
CO2: 25 mmol/L (ref 20–29)
CREATININE: 1.19 mg/dL (ref 0.76–1.27)
Calcium: 9.7 mg/dL (ref 8.7–10.2)
Chloride: 102 mmol/L (ref 96–106)
GFR, EST AFRICAN AMERICAN: 77 mL/min/{1.73_m2} (ref >59)
GFR, EST NON AFRICAN AMERICAN: 67 mL/min/{1.73_m2} (ref >59)
GLUCOSE: 105 mg/dL — AB (ref 65–99)
Globulin, Total: 3.1 g/dL (ref 1.5–4.5)
Potassium: 4.5 mmol/L (ref 3.5–5.2)
Sodium: 142 mmol/L (ref 134–144)
TOTAL PROTEIN: 7.5 g/dL (ref 6.0–8.5)

## 2017-11-17 LAB — LIPID PANEL
CHOL/HDL RATIO: 3.7 ratio (ref 0.0–5.0)
Cholesterol, Total: 166 mg/dL (ref 100–199)
HDL: 45 mg/dL (ref >39)
LDL Calculated: 91 mg/dL (ref 0–99)
TRIGLYCERIDES: 152 mg/dL — AB (ref 0–149)
VLDL CHOLESTEROL CAL: 30 mg/dL (ref 5–40)

## 2017-11-17 LAB — HEMOGLOBIN A1C
Est. average glucose Bld gHb Est-mCnc: 120 mg/dL
Hgb A1c MFr Bld: 5.8 % — ABNORMAL HIGH (ref 4.8–5.6)

## 2017-11-21 ENCOUNTER — Encounter: Payer: Self-pay | Admitting: *Deleted

## 2017-12-13 ENCOUNTER — Encounter: Payer: Self-pay | Admitting: Cardiology

## 2017-12-27 ENCOUNTER — Other Ambulatory Visit: Payer: Self-pay | Admitting: Cardiology

## 2018-01-02 NOTE — Progress Notes (Signed)
Electrophysiology Office Note   Date:  01/03/2018   ID:  [facility] [place], [place] 12/07/1960, MRN [id number]  PCP:  Horald Pollen, MD  Cardiologist:  Einar Gip Primary Electrophysiologist:  Dr Curt Bears    CC: Follow up for atrial fibrillation   History of Present Illness: Evan Page is a 57 y.o. male who is being seen today for the evaluation of atrial fibrillation at the request of Adrian Prows. Presenting today for electrophysiology evaluation.  He has atrial fibrillation.  He was managed with flecainide, metoprolol, and Eliquis.   On 02/13/17 he began noticing more exertional dizziness and fatigue.  Of note, he has a 10-pack-year history, quit smoking in 1998. He was put on flecainide and was noted to be in atrial flutter post flecainide.  He had a cardioversion after the diagnosis of atrial flutter to sinus rhythm.  Cardioversion was on a 03/04/17.  He had an atrial fibrillation ablation 05/06/17. He has been diagnosed with OSA and has been compliant with his CPAP for several months now. He reports that he "feels like a new man" after starting the CPAP. He reports very few, if any, episodes of afib.   Today, denies symptoms of palpitations, chest pain, shortness of breath, orthopnea, PND, lower extremity edema, claudication, dizziness, presyncope, syncope, bleeding, or neurologic sequela. The patient is tolerating medications without difficulties.   Past Medical History:  Diagnosis Date  . Atrial fibrillation ([place])    a. 04/2017 s/p PVI.  Marland Kitchen Cataract    left   . GERD (gastroesophageal reflux disease)   . Sleep apnea    "waiting on my CPAP" (05/23/2017)   Past Surgical History:  Procedure Laterality Date  . ATRIAL FIBRILLATION ABLATION N/A 05/06/2017   Procedure: ATRIAL FIBRILLATION ABLATION;  Surgeon: Constance Haw, MD;  Location: [place] [facility];  Service: Cardiovascular;  Laterality: N/A;  . CARDIOVERSION N/A 03/04/2017   Procedure: CARDIOVERSION;   Surgeon: Adrian Prows, MD;  Location: [place];  Service: Cardiovascular;  Laterality: N/A;  . LEFT HEART CATH AND CORONARY ANGIOGRAPHY N/A 05/24/2017   Procedure: LEFT HEART CATH AND CORONARY ANGIOGRAPHY possible angioplasty;  Surgeon: Nigel Mormon, MD;  Location: [place] [facility];  Service: Cardiovascular;  Laterality: N/A;  . WISDOM TOOTH EXTRACTION     3 removed - 1 remains     Current Outpatient Medications  Medication Sig Dispense Refill  . diltiazem (CARDIZEM CD) 120 MG 24 hr capsule TAKE 1 CAPSULE BY MOUTH EVERY DAY 90 capsule 2  . flecainide (TAMBOCOR) 100 MG tablet Take 50 mg by mouth 2 (two) times daily.     . sildenafil (VIAGRA) 100 MG tablet Take 0.5-1 tablets (50-100 mg total) by mouth daily as needed for erectile dysfunction. 10 tablet 11   No current facility-administered medications for this visit.     Allergies:   Patient has no known allergies.   Social History:  The patient  reports that he quit smoking about 21 years ago. His smoking use included cigarettes. He has a 20.00 pack-year smoking history. He has never used smokeless tobacco. He reports that he drinks about 3.0 standard drinks of alcohol per week. He reports that he does not use drugs.   Family History:  The patient's family history includes Diabetes in his father and mother; Drug abuse in his sister; Esophageal cancer in his father; Hyperlipidemia in his father, maternal grandmother, and mother.    ROS:  Please see the history of present illness.  All other systems  are reviewed and negative.   PHYSICAL EXAM: VS:  BP (!) 144/70   Pulse 61   Ht 5\' 8"  (1.727 m)   Wt 222 lb 3.2 oz (100.8 kg)   SpO2 98%   BMI 33.79 kg/m  , BMI Body mass index is 33.79 kg/m. GEN: Well nourished, well developed, in no acute distress  HEENT: normal  Neck: no JVD, carotid bruits, or masses Cardiac: RRR; no murmurs, rubs, or gallops,no edema  Respiratory:  clear to auscultation bilaterally, normal work of  breathing GI: soft, nontender, nondistended, + BS MS: no deformity or atrophy  Skin: warm and dry Neuro:  Strength and sensation are intact Psych: euthymic mood, full affect  EKG:  EKG is ordered today. Personal review of the ekg ordered shows sinus rhythm HR 61, LAFB, early transition V1  Recent Labs: 02/11/2017: TSH 2.710 05/23/2017: Magnesium 2.2 11/16/2017: ALT 36; BUN 13; Creatinine, Ser 1.19; Hemoglobin 15.2; Platelets 278; Potassium 4.5; Sodium 142    Lipid Panel     Component Value Date/Time   CHOL 166 11/16/2017 1148   TRIG 152 (H) 11/16/2017 1148   HDL 45 11/16/2017 1148   CHOLHDL 3.7 11/16/2017 1148   CHOLHDL 3.8 10/20/2015 1651   VLDL 61 (H) 10/20/2015 1651   LDLCALC 91 11/16/2017 1148     Wt Readings from Last 3 Encounters:  01/03/18 222 lb 3.2 oz (100.8 kg)  11/01/17 222 lb 6.4 oz (100.9 kg)  10/20/17 225 lb (102.1 kg)      Other studies Reviewed: Additional studies/ records that were reviewed today include: Myoview 02/18/17 - personally reviewed Review of the above records today demonstrates:  Low risk study, EF 55%  Echocardiogram 02/14/2017: Left ventricle cavity is normal in size. Normal global wall motion. Unable to evaluate diastolic function due to A. Fibrillation. Calculated EF 68%. Trace mitral regurgitation. Normal left atrial size  ASSESSMENT AND PLAN:  1.  Persistent atrial fibrillation/atrial flutter: On flecainide, and diltiazem. No anticoagulation indicated per guidelines. He is status post atrial fibrillation ablation 05/06/2017.  He has done very well since his last visit with minimal symptoms. Continue Flecainide 100mg  BID Continue diltiazem 120mg  daily Patient would like to consider stopping some of his medications. About 3 months prior to his next visit, patient Derl Abalos stop AAD. At the next office visit, we Demarian Epps evaluate his symptoms off of medications and determine a long-term strategy.  This patients CHA2DS2-VASc Score and unadjusted  Ischemic Stroke Rate (% per year) is equal to 0.2 % stroke rate/year from a score of 0  Above score calculated as 1 point each if present [CHF, HTN, DM, Vascular=MI/PAD/Aortic Plaque, Age if 65-74, or Male] Above score calculated as 2 points each if present [Age > 75, or Stroke/TIA/TE]  2. OSA Patient reports good compliance with CPAP  Current medicines are reviewed at length with the patient today.   The patient does not have concerns regarding his medicines.  The following changes were made today: none   Labs/ tests ordered today include:  Orders Placed This Encounter  Procedures  . EKG 12-Lead    Disposition:   FU with Araceli Arango 6 months  Signed, Tayler Lassen Meredith Leeds, MD  01/03/2018 12:02 PM     [address] [place] [address] [phone] (office) [phone] (fax)  I have seen and examined this patient with Adline Peals.  Agree with above, note added to reflect my findings.  On exam, RRR, no murmurs, lungs clear.  Currently feeling  well without complaint.  He is started his CPAP and is noted an improvement in his sleeping and fatigue.  He was switched from metoprolol to diltiazem at the last visit due to erectile dysfunction which has since improved.  As he is feeling well, no changes.  He Mesa Janus stop his flecainide in 3 months to see if he has more atrial fibrillation.  Madisin Hasan M. Darrill Vreeland MD 01/03/2018 12:02 PM

## 2018-01-03 ENCOUNTER — Encounter (INDEPENDENT_AMBULATORY_CARE_PROVIDER_SITE_OTHER): Payer: Self-pay

## 2018-01-03 ENCOUNTER — Encounter: Payer: Self-pay | Admitting: Cardiology

## 2018-01-03 ENCOUNTER — Ambulatory Visit (INDEPENDENT_AMBULATORY_CARE_PROVIDER_SITE_OTHER): Payer: BC Managed Care – PPO | Admitting: Cardiology

## 2018-01-03 VITALS — BP 144/70 | HR 61 | Ht 68.0 in | Wt 222.2 lb

## 2018-01-03 DIAGNOSIS — G4733 Obstructive sleep apnea (adult) (pediatric): Secondary | ICD-10-CM | POA: Diagnosis not present

## 2018-01-03 DIAGNOSIS — I4819 Other persistent atrial fibrillation: Secondary | ICD-10-CM | POA: Diagnosis not present

## 2018-01-03 NOTE — Patient Instructions (Signed)
Medication Instructions:  Your physician recommends that you continue on your current medications as directed. Please refer to the Current Medication list given to you today.  * If you need a refill on your cardiac medications before your next appointment, please call your pharmacy.   Labwork: None ordered  Testing/Procedures: None ordered  Follow-Up: Your physician wants you to follow-up in: 6 month with Dr. Curt Bears.  You will receive a reminder letter in the mail two months in advance. If you don't receive a letter, please call our office to schedule the follow-up appointment.  *Please note that any paperwork needing to be filled out by the provider will need to be addressed at the front desk prior to seeing the provider. Please note that any FMLA, disability or other documents regarding health condition is subject to a $25.00 charge that must be received prior to completion of paperwork in the form of a money order or check.  Thank you for choosing [facility]!!   Trinidad Curet, RN [phone]  Any Other Special Instructions Will Be Listed Below (If Applicable).

## 2018-03-17 ENCOUNTER — Encounter (HOSPITAL_COMMUNITY): Payer: Self-pay | Admitting: Emergency Medicine

## 2018-03-17 ENCOUNTER — Ambulatory Visit (HOSPITAL_COMMUNITY)
Admission: EM | Admit: 2018-03-17 | Discharge: 2018-03-17 | Disposition: A | Discharge status: Home / Self Care | Payer: BC Managed Care – PPO | Attending: Family Medicine | Admitting: Family Medicine

## 2018-03-17 DIAGNOSIS — J40 Bronchitis, not specified as acute or chronic: Secondary | ICD-10-CM

## 2018-03-17 MED ORDER — PREDNISONE 50 MG PO TABS: ORAL_TABLET | ORAL | 0 refills | Status: DC

## 2018-03-17 MED ORDER — AMOXICILLIN 875 MG PO TABS: 875.0000 mg | ORAL_TABLET | Freq: Two times a day (BID) | ORAL | 0 refills | Status: DC

## 2018-03-17 MED ORDER — BENZONATATE 100 MG PO CAPS: 100.0000 mg | ORAL_CAPSULE | Freq: Three times a day (TID) | ORAL | 0 refills | Status: DC | PRN

## 2018-03-17 NOTE — ED Provider Notes (Signed)
[place]    CSN: [id number] Arrival date & time: 03/17/18  1001     History   Chief Complaint Chief Complaint  Patient presents with  . Cough  . URI    HPI Evan Page is a 58 y.o. male.    cough and congestion x 10 days   Patient works as a Sports coach at [employer]  Patient has no history of asthma, fever, or smoking.  He denies any shortness of breath.  He has had atrial fibrillation the past.     Past Medical History:  Diagnosis Date  . Atrial fibrillation ([place])    a. 04/2017 s/p PVI.  Marland Kitchen Cataract    left   . GERD (gastroesophageal reflux disease)   . Sleep apnea    "waiting on my CPAP" (05/23/2017)    Patient Active Problem List   Diagnosis Date Noted  . History of sleep apnea 11/01/2017  . History of atrial fibrillation 11/01/2017  . Erectile dysfunction 11/01/2017  . Paroxysmal A-fib ([place]) 05/06/2017  . Paroxysmal atrial fibrillation ([place]) 03/03/2017    Past Surgical History:  Procedure Laterality Date  . ATRIAL FIBRILLATION ABLATION N/A 05/06/2017   Procedure: ATRIAL FIBRILLATION ABLATION;  Surgeon: Constance Haw, MD;  Location: [place] [facility];  Service: Cardiovascular;  Laterality: N/A;  . CARDIOVERSION N/A 03/04/2017   Procedure: CARDIOVERSION;  Surgeon: Adrian Prows, MD;  Location: [place];  Service: Cardiovascular;  Laterality: N/A;  . LEFT HEART CATH AND CORONARY ANGIOGRAPHY N/A 05/24/2017   Procedure: LEFT HEART CATH AND CORONARY ANGIOGRAPHY possible angioplasty;  Surgeon: Nigel Mormon, MD;  Location: [place] [facility];  Service: Cardiovascular;  Laterality: N/A;  . WISDOM TOOTH EXTRACTION     3 removed - 1 remains       Home Medications    Prior to Admission medications   Medication Sig Start Date End Date Taking? Authorizing Provider  amoxicillin (AMOXIL) 875 MG tablet Take 1 tablet (875 mg total) by mouth 2 (two) times daily. 03/17/18   Robyn Haber,  MD  benzonatate (TESSALON) 100 MG capsule Take 1-2 capsules (100-200 mg total) by mouth 3 (three) times daily as needed for cough. 03/17/18   Robyn Haber, MD  diltiazem (CARDIZEM CD) 120 MG 24 hr capsule TAKE 1 CAPSULE BY MOUTH EVERY DAY 12/27/17   Camnitz, Ocie Doyne, MD  flecainide (TAMBOCOR) 100 MG tablet Take 50 mg by mouth 2 (two) times daily.     [provider]  predniSONE (DELTASONE) 50 MG tablet One daily with food 03/17/18   Robyn Haber, MD  sildenafil (VIAGRA) 100 MG tablet Take 0.5-1 tablets (50-100 mg total) by mouth daily as needed for erectile dysfunction. 11/01/17   Horald Pollen, MD    Family History Family History  Problem Relation Age of Onset  . Diabetes Mother   . Hyperlipidemia Mother   . Diabetes Father   . Hyperlipidemia Father   . Esophageal cancer Father   . Drug abuse Sister   . Hyperlipidemia Maternal Grandmother   . Colon cancer Neg Hx   . Pancreatic cancer Neg Hx   . Prostate cancer Neg Hx   . Rectal cancer Neg Hx   . Stomach cancer Neg Hx     Social History Social History   Tobacco Use  . Smoking status: Former Smoker    Packs/day: 1.00    Years: 20.00    Pack years: 20.00    Types: Cigarettes  Last attempt to quit: 1998    Years since quitting: 22.1  . Smokeless tobacco: Never Used  Substance Use Topics  . Alcohol use: Yes    Alcohol/week: 3.0 standard drinks    Types: 3 Glasses of wine per week  . Drug use: No     Allergies   Patient has no known allergies.   Review of Systems Review of Systems   Physical Exam Triage Vital Signs ED Triage Vitals  Enc Vitals Group     BP 03/17/18 1043 129/61     Pulse Rate 03/17/18 1043 65     Resp 03/17/18 1043 18     Temp 03/17/18 1043 97.9 F (36.6 C)     Temp Source 03/17/18 1043 Temporal     SpO2 03/17/18 1043 97 %     Weight --      Height --      Head Circumference --      Peak Flow --      Pain Score 03/17/18 1044 0     Pain Loc --      Pain Edu?  --      Excl. in [place]? --    No data found.  Updated Vital Signs BP 129/61 (BP Location: Right Arm)   Pulse 65   Temp 97.9 F (36.6 C) (Temporal)   Resp 18   SpO2 97%    Physical Exam Vitals signs and nursing note reviewed.  Constitutional:      General: He is not in acute distress.    Appearance: Normal appearance. He is obese.  HENT:     Head: Normocephalic.     Right Ear: Tympanic membrane and external ear normal.     Left Ear: Tympanic membrane and external ear normal.     Nose: Nose normal.     Mouth/Throat:     Pharynx: Oropharynx is clear.  Eyes:     Conjunctiva/sclera: Conjunctivae normal.  Neck:     Musculoskeletal: Normal range of motion and neck supple.  Cardiovascular:     Rate and Rhythm: Normal rate and regular rhythm.     Pulses: Normal pulses.     Heart sounds: Normal heart sounds.  Pulmonary:     Effort: Pulmonary effort is normal.     Breath sounds: Normal breath sounds.  Musculoskeletal: Normal range of motion.  Skin:    General: Skin is warm and dry.  Neurological:     General: No focal deficit present.     Mental Status: He is alert.  Psychiatric:        Mood and Affect: Mood normal.      UC Treatments / Results  Labs (all labs ordered are listed, but only abnormal results are displayed) Labs Reviewed - No data to display  EKG None  Radiology No results found.  Procedures Procedures (including critical care time)  Medications Ordered in UC Medications - No data to display  Initial Impression / Assessment and Plan / UC Course  I have reviewed the triage vital signs and the nursing notes.  Pertinent labs & imaging results that were available during my care of the patient were reviewed by me and considered in my medical decision making (see chart for details).    Final Clinical Impressions(s) / UC Diagnoses   Final diagnoses:  Bronchitis   Discharge Instructions   None    ED Prescriptions    Medication Sig Dispense  Auth. Provider   benzonatate (TESSALON) 100 MG capsule Take 1-2 capsules (  100-200 mg total) by mouth 3 (three) times daily as needed for cough. 40 capsule Robyn Haber, MD   predniSONE (DELTASONE) 50 MG tablet One daily with food 5 tablet Robyn Haber, MD   amoxicillin (AMOXIL) 875 MG tablet Take 1 tablet (875 mg total) by mouth 2 (two) times daily. 20 tablet Robyn Haber, MD     Controlled Substance Prescriptions [place] Controlled Substance Registry consulted? Not Applicable   Robyn Haber, MD 03/17/18 1114

## 2018-03-17 NOTE — ED Triage Notes (Signed)
Pt sts cough and congestion x 10 days

## 2018-03-27 ENCOUNTER — Encounter: Payer: Self-pay | Admitting: Cardiology

## 2018-03-27 ENCOUNTER — Ambulatory Visit: Payer: BC Managed Care – PPO | Admitting: Cardiology

## 2018-03-27 VITALS — BP 130/74 | HR 60 | Ht 67.0 in | Wt 219.9 lb

## 2018-03-27 DIAGNOSIS — E669 Obesity, unspecified: Secondary | ICD-10-CM | POA: Insufficient documentation

## 2018-03-27 DIAGNOSIS — Z9989 Dependence on other enabling machines and devices: Secondary | ICD-10-CM | POA: Diagnosis not present

## 2018-03-27 DIAGNOSIS — G4733 Obstructive sleep apnea (adult) (pediatric): Secondary | ICD-10-CM

## 2018-03-27 DIAGNOSIS — I480 Paroxysmal atrial fibrillation: Secondary | ICD-10-CM

## 2018-03-27 NOTE — Progress Notes (Signed)
Subjective:   [facility], male    DOB: 12/15/1960, 58 y.o.   MRN: [id number]  Horald Pollen, MD:  Chief Complaint  Patient presents with  . Atrial Fibrillation  . Follow-up    80mth, Last EKG 01/03/18 (EPIC)    HPI: Evan Page  is a 58 y.o. male  with mild to moderate obesity, severe sleep apnea on CPAP and tolerating this well, chronic stage III kidney disease, atrial fibrillation and failed cardioversion S/P atrial fibrillation ablation by Dr. Curt Bears on 05/06/2017, had atrial flutter on 05/23/2017 when he presented with chest pain(normal coronary angiogram) and underwent cardioversion and has maintained sinus rhythm since then and is presently on flecainide. He is being followed by EP and anticoagulation has been discontinued.   Last evaluated by Dr. Curt Bears in Dec 2019 and as he is doing well and has been maintaining sinus rhythm, is planning to stop flecainide sometime next month to see if he has any reoccurrence of A fib.   He continues to feel well and without any complaints. He continues to have occasional (once every 1-2 months) episodes of palpitations that are brief. He states nothing like he had before. He has been working to lose weight and has been making diet changes and exercising 3-4 days a week.     Past Medical History:  Diagnosis Date  . Atrial fibrillation ([place])    a. 04/2017 s/p PVI.  Marland Kitchen Cataract    left   . GERD (gastroesophageal reflux disease)   . Sleep apnea    "waiting on my CPAP" (05/23/2017)    Past Surgical History:  Procedure Laterality Date  . ATRIAL FIBRILLATION ABLATION N/A 05/06/2017   Procedure: ATRIAL FIBRILLATION ABLATION;  Surgeon: Constance Haw, MD;  Location: [place] [facility];  Service: Cardiovascular;  Laterality: N/A;  . CARDIOVERSION N/A 03/04/2017   Procedure: CARDIOVERSION;  Surgeon: Adrian Prows, MD;  Location: [place];  Service: Cardiovascular;  Laterality: N/A;  . LEFT HEART CATH  AND CORONARY ANGIOGRAPHY N/A 05/24/2017   Procedure: LEFT HEART CATH AND CORONARY ANGIOGRAPHY possible angioplasty;  Surgeon: Nigel Mormon, MD;  Location: [place] [facility];  Service: Cardiovascular;  Laterality: N/A;  . WISDOM TOOTH EXTRACTION     3 removed - 1 remains    Family History  Problem Relation Age of Onset  . Diabetes Mother   . Hyperlipidemia Mother   . Diabetes Father   . Hyperlipidemia Father   . Esophageal cancer Father   . Drug abuse Sister   . Hyperlipidemia Maternal Grandmother   . Colon cancer Neg Hx   . Pancreatic cancer Neg Hx   . Prostate cancer Neg Hx   . Rectal cancer Neg Hx   . Stomach cancer Neg Hx     Social History   Socioeconomic History  . Marital status: Divorced    Spouse name: Not on file  . Number of children: 2  . Years of education: Not on file  . Highest education level: Not on file  Occupational History  . Not on file  Social Needs  . Financial resource strain: Not on file  . Food insecurity:    Worry: Not on file    Inability: Not on file  . Transportation needs:    Medical: Not on file    Non-medical: Not on file  Tobacco Use  . Smoking status: Former Smoker    Packs/day: 1.00    Years: 20.00  Pack years: 20.00    Types: Cigarettes    Last attempt to quit: 1998    Years since quitting: 22.1  . Smokeless tobacco: Never Used  Substance and Sexual Activity  . Alcohol use: Yes    Alcohol/week: 3.0 standard drinks    Types: 3 Glasses of wine per week  . Drug use: No  . Sexual activity: Yes  Lifestyle  . Physical activity:    Days per week: Not on file    Minutes per session: Not on file  . Stress: Not on file  Relationships  . Social connections:    Talks on phone: Not on file    Gets together: Not on file    Attends religious service: Not on file    Active member of club or organization: Not on file    Attends meetings of clubs or organizations: Not on file    Relationship status: Not on file  .  Intimate partner violence:    Fear of current or ex partner: Not on file    Emotionally abused: Not on file    Physically abused: Not on file    Forced sexual activity: Not on file  Other Topics Concern  . Not on file  Social History Narrative   ** Merged History Encounter **        Current Meds  Medication Sig  . benzonatate (TESSALON) 100 MG capsule Take 1-2 capsules (100-200 mg total) by mouth 3 (three) times daily as needed for cough.  . diltiazem (CARDIZEM CD) 120 MG 24 hr capsule TAKE 1 CAPSULE BY MOUTH EVERY DAY  . sildenafil (VIAGRA) 100 MG tablet Take 0.5-1 tablets (50-100 mg total) by mouth daily as needed for erectile dysfunction.     Review of Systems  Constitution: Negative for decreased appetite, malaise/fatigue, weight gain and weight loss.  Eyes: Negative for visual disturbance.  Cardiovascular: Positive for palpitations (occasional; stable). Negative for chest pain, claudication, dyspnea on exertion, leg swelling, orthopnea and syncope.  Respiratory: Positive for snoring (on CPAP). Negative for hemoptysis and wheezing.   Endocrine: Negative for cold intolerance and heat intolerance.  Hematologic/Lymphatic: Does not bruise/bleed easily.  Skin: Negative for nail changes.  Musculoskeletal: Negative for muscle weakness and myalgias.  Gastrointestinal: Negative for abdominal pain, change in bowel habit, nausea and vomiting.  Neurological: Negative for difficulty with concentration, dizziness, focal weakness and headaches.  Psychiatric/Behavioral: Negative for altered mental status and suicidal ideas.  All other systems reviewed and are negative.      Objective:     Blood pressure 130/74, pulse 60, height 5\' 7"  (1.702 m), weight 219 lb 14.4 oz (99.7 kg), SpO2 96 %.  Echocardiogram 02/14/2017: Left ventricle cavity is normal in size. Normal global wall motion. Unable to evaluate diastolic function due to A. Fibrillation. Calculated EF 68%. Trace mitral  regurgitation.  Cath 05/24/2017: Left dominant normal coronaries  Physical Exam  Constitutional: He is oriented to person, place, and time. Vital signs are normal. He appears well-developed and well-nourished.  HENT:  Head: Normocephalic and atraumatic.  Neck: Normal range of motion.  Cardiovascular: Normal rate, regular rhythm, normal heart sounds and intact distal pulses.  Pulmonary/Chest: Effort normal and breath sounds normal. No accessory muscle usage. No respiratory distress.  Abdominal: Soft. Bowel sounds are normal.  Musculoskeletal: Normal range of motion.  Neurological: He is alert and oriented to person, place, and time.  Skin: Skin is warm and dry.  Vitals reviewed.  Assessment & Recommendations:   1. Paroxysmal atrial fibrillation ([place]) Doing well without any known reoccurrence of A fib since April 2019. He is planning to stop flecainide next month to see if he has reoccurrence of A fib. He will continue to follow also with Dr. Curt Bears regarding this. Not on anticoagulation per guidelines. Hopefully will not have reoccurrence with modifying his risk factors sleep apnea and obesity.   2. Obstructive sleep apnea on CPAP Tolerating this well and has had improvement in fatigue.   3. Obesity (BMI 30.0-34.9) Making diet and lifestyle changes and has continued to lose weight. Stressed the importance of continued efforts toward weight loss.   Patient is overall doing well. No changes made to medications today. We will see him back in 6 months or sooner if needed.   Jeri Lager, FNP-C [facility], [place] Office: [phone] Fax: [phone]

## 2018-04-01 NOTE — Progress Notes (Signed)
Please finalize

## 2018-04-20 ENCOUNTER — Ambulatory Visit: Payer: BC Managed Care – PPO | Admitting: Nurse Practitioner

## 2018-09-07 IMAGING — DX DG CHEST 2V
2 series · 2 of 2 positions shown · non-contrast
Comparison: None.

CLINICAL DATA: Palpitations, dizziness

EXAM:
CHEST  2 VIEW

[chest pa]
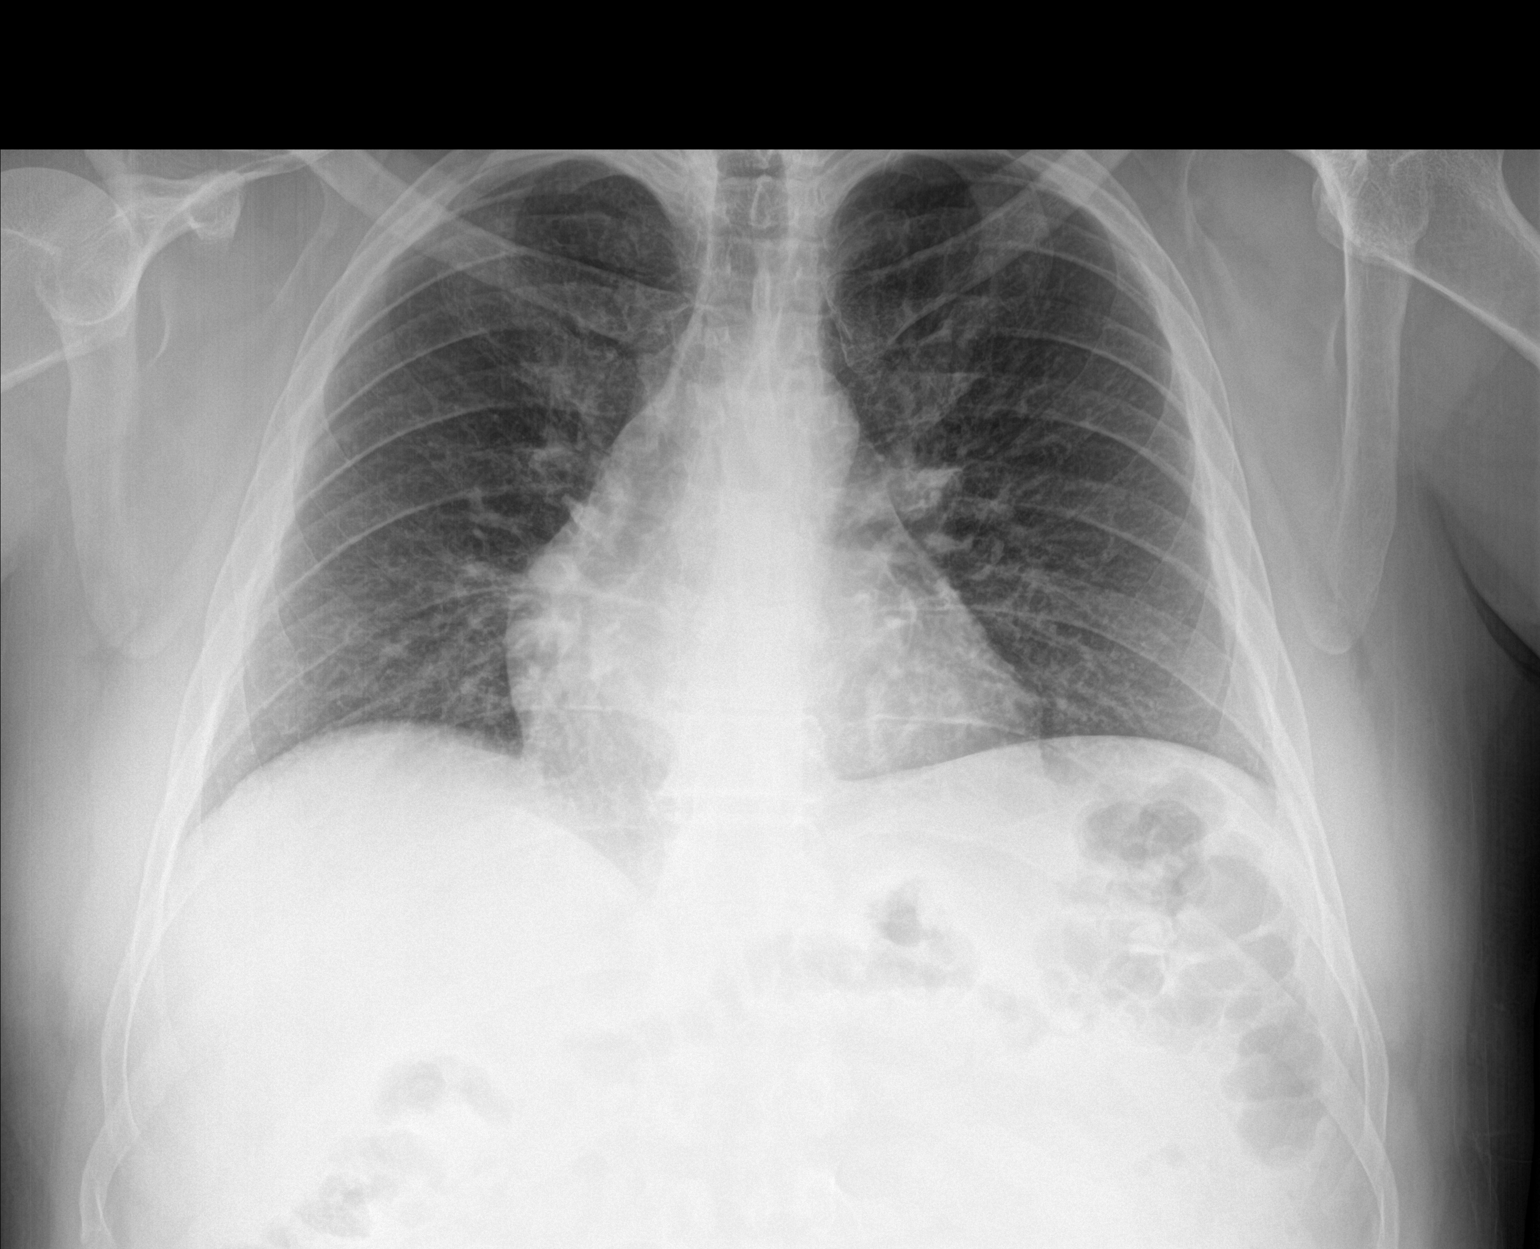

[chest lat]
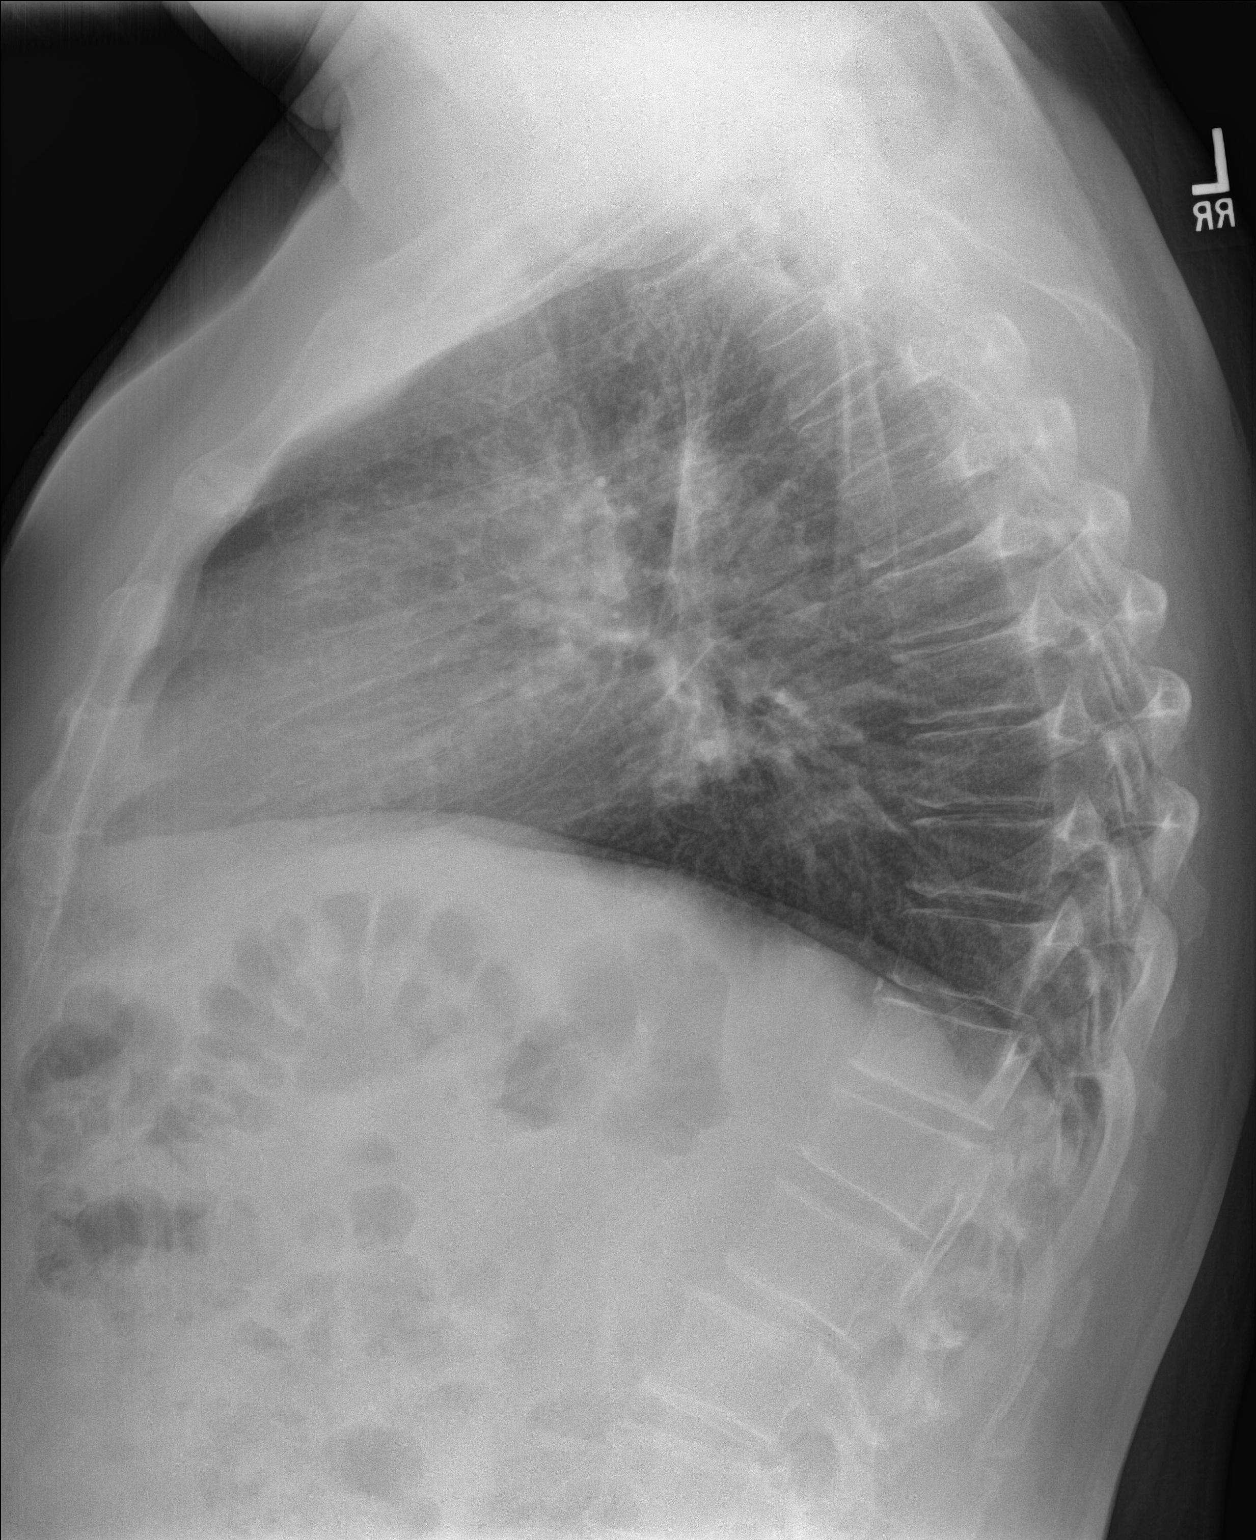

[2 of 2 positions shown; findings below may reference images not displayed]

FINDINGS: Low lung volumes. Heart and mediastinal contours are within normal
limits. No focal opacities or effusions. No acute bony abnormality.
IMPRESSION: No active cardiopulmonary disease.

## 2018-11-13 ENCOUNTER — Encounter: Payer: Self-pay | Admitting: Emergency Medicine

## 2018-11-13 ENCOUNTER — Other Ambulatory Visit: Payer: Self-pay

## 2018-11-13 ENCOUNTER — Ambulatory Visit: Payer: BC Managed Care – PPO | Admitting: Emergency Medicine

## 2018-11-13 VITALS — BP 146/67 | HR 70 | Temp 98.3°F | Resp 16 | Ht 68.0 in | Wt 227.2 lb

## 2018-11-13 DIAGNOSIS — M109 Gout, unspecified: Secondary | ICD-10-CM

## 2018-11-13 DIAGNOSIS — Z8669 Personal history of other diseases of the nervous system and sense organs: Secondary | ICD-10-CM

## 2018-11-13 DIAGNOSIS — Z8679 Personal history of other diseases of the circulatory system: Secondary | ICD-10-CM | POA: Diagnosis not present

## 2018-11-13 MED ORDER — COLCHICINE 0.6 MG PO TABS: ORAL_TABLET | ORAL | 1 refills | Status: DC

## 2018-11-13 MED ORDER — INDOMETHACIN 50 MG PO CAPS: 50.0000 mg | ORAL_CAPSULE | Freq: Three times a day (TID) | ORAL | 1 refills | Status: AC

## 2018-11-13 NOTE — Patient Instructions (Addendum)
If you have lab work done today you will be contacted with your lab results within the next 2 weeks.  If you have not heard from [place] then please contact us. The fastest way to get your results is to register for My Chart.   IF you received an x-ray today, you will receive an invoice from [facility]. Please contact [facility] at [phone] with questions or concerns regarding your invoice.   IF you received labwork today, you will receive an invoice from [facility]. Please contact [facility] at [phone] with questions or concerns regarding your invoice.   Our billing staff will not be able to assist you with questions regarding bills from these companies.  You will be contacted with the lab results as soon as they are available. The fastest way to get your results is to activate your My Chart account. Instructions are located on the last page of this paperwork. If you have not heard from [place] regarding the results in 2 weeks, please contact this office.     Gota Gout  La gota es una hinchazn dolorosa de las articulaciones. La gota es un tipo de artritis. Es causada por el exceso de cido rico en el cuerpo. El cido rico es una sustancia qumica que se produce cuando el cuerpo descompone sustancias llamadas purinas. Si el cuerpo tiene un exceso de cido rico, se pueden formar cristales con punta y acumularse en las articulaciones. Esto provoca dolor e hinchazn. Las crisis de gota pueden ocurrir rpidamente y ser muy dolorosas (gota [place]). Con el tiempo, las crisis pueden afectar ms articulaciones y ocurrir con mayor frecuencia (gota crnica). Cules son las causas?  El exceso de cido rico en la [place]. Esto puede ocurrir debido a lo siguiente: ? Los riones no eliminan la cantidad suficiente de cido rico de Herbalist. ? El cuerpo produce demasiado cido rico. ? Come demasiados alimentos ricos en purinas. Estos alimentos incluyen vsceras, algunos mariscos  y Engineer, manufacturing systems.  Un traumatismo o estrs. Qu incrementa el riesgo?  Tener antecedentes familiares de gota.  Ser hombre de [employer].  Ser mujer y haber atravesado la menopausia.  Tener mucho sobrepeso (obesidad).  Beber alcohol, en especial, cerveza.  No tener la cantidad suficiente de agua en el organismo (estar deshidratado).  Perder peso con demasiada rapidez.  Haber tenido un trasplante de rgano.  Haberse intoxicado con plomo.  Tomar ciertos medicamentos.  Tener enfermedades renales.  Tener una afeccin de la piel llamada psoriasis. Cules son los signos o los sntomas? Evan Page crisis de gota aguda generalmente ocurre solo en Insurance claims handler. El lugar ms comn es el pulgar del pie. Las crisis a menudo comienzan por la noche. Tambin pueden verse afectadas las articulaciones de los pies, los tobillos, las rodillas, los dedos de la mano, las muecas o los codos. Los sntomas de una crisis pueden incluir lo siguiente:  Dolor intenso.  Calor.  Hinchazn.  Entumecimiento.  Piel brillosa, roja o morada.  Dolor a Secretary/administrator. Se puede sentir mucho dolor al tacto en la articulacin afectada.  Evan Page y escalofros. La gota crnica puede provocar sntomas con ms frecuencia. Pueden verse involucradas ms articulaciones. Es posible que tambin tenga bultos blancos o amarillos (tofos) en las manos o los pies, o en otras zonas cercanas a las articulaciones. Cmo se trata?  El tratamiento para esta afeccin tiene dos fases: tratar un ataque agudo y prevenir ataques futuros.  El tratamiento de la gota aguda puede incluir lo siguiente: ?  Antiinflamatorios no esteroideos (AINE). ? Corticoesteroides. Se toman por la boca o se pueden inyectar en una articulacin. ? Colchicina. Este medicamento [employer] dolor y la hinchazn. Puede administrarse por la boca o por una va intravenosa.  El tratamiento preventivo puede incluir lo siguiente: ? El uso diario de dosis bajas de  antiinflamatorios no esteroideos (AINE) o colchicina. ? El uso de un medicamento que reduce los niveles de cido rico en la [place]. ? Realizar cambios en la dieta. Es posible que deba consultar a un Research officer, political party (nutricionista) acerca de lo que debe comer y beber para Biochemist, clinical. Siga estas indicaciones en su casa: Durante una crisis de gota   Si se lo indican, aplique hielo sobre la zona dolorida: ? Field seismologist hielo en una bolsa plstica. ? Coloque una [employer] piel y Therapist, nutritional. ? Coloque el hielo durante 69minutos, 2 a 3veces por da.  Levante (eleve) la articulacin dolorida por encima del nivel del corazn tan frecuentemente como sea posible.  Haga reposo todo el tiempo que pueda. Si la articulacin est en la pierna, quiz deba usar muletas.  Siga las indicaciones del mdico respecto de lo que no puede comer o beber. Cmo evitar las crisis de gota en el futuro  Siga una dieta con bajo contenido de purinas. Evite el consumo de alimentos y bebidas como los siguientes: ? Hgado. ? Rin. ? Anchoas. ? Esprragos. ? Arenque. ? Hongos. ? Mejillones. ? Cerveza.  Mantenga un peso saludable. Si desea adelgazar, hable con el mdico. No baje de peso demasiado rpido.  Comience o contine con un plan de ejercicios como se lo haya indicado el mdico. Comida y bebida  Beba suficiente lquido para mantener el pis (la [place]) de color amarillo plido.  Si bebe alcohol: ? Limite la cantidad que bebe a lo siguiente:  De 0 a 1 medida por da para las mujeres.  De 0 a 2 medidas por da para los hombres. ? Est atento a la cantidad de alcohol que hay en las bebidas que toma. En los [place], una medida equivale a una botella de cerveza de 12oz (329ml), un vaso de vino de 5oz (135ml) o un vaso de una bebida alcohlica de alta graduacin de 1oz (55ml). Indicaciones generales  [employer] de venta libre y los recetados solamente como se lo  haya indicado el mdico.  No conduzca ni use maquinaria pesada mientras toma analgsicos recetados.  Retome sus actividades normales segn lo indicado por el mdico. Pregntele al mdico qu actividades son seguras para usted.  Concurra a todas las visitas de control como se lo haya indicado el mdico. Esto es importante. Comunquese con un mdico si:  Tiene otra crisis de gota.  Sigue teniendo sntomas de [place] crisis de gota despus de 10das de tratamiento.  Tiene problemas (efectos secundarios) debido a los medicamentos.  Siente escalofros o tiene fiebre.  Tiene sensacin de [employer] al [employer].  Siente dolor en la parte inferior de la espalda o en el abdomen. Solicite ayuda inmediatamente si:  Interior and spatial designer.  No es posible Financial controller.  No puede hacer pis. Resumen  La gota es una hinchazn dolorosa de las articulaciones.  El lugar ms habitual donde se presenta el dolor es el dedo gordo del pie, pero tambin pueden verse afectadas otras articulaciones.  Los medicamentos y evitar algunos alimentos pueden ayudar a prevenir y tratar las crisis de [place]. Esta informacin no tiene Marine scientist el consejo  del mdico. Asegrese de hacerle al mdico cualquier pregunta que tenga. Document Released: 01/07/2011 Document Revised: 09/21/2017 Document Reviewed: 09/21/2017 Elsevier Patient Education  2020 [place].  Gout  Gout is painful swelling of your joints. Gout is a type of arthritis. It is caused by having too much uric acid in your body. Uric acid is a chemical that is made when your body breaks down substances called purines. If your body has too much uric acid, sharp crystals can form and build up in your joints. This causes pain and swelling. Gout attacks can happen quickly and be very painful (acute gout). Over time, the attacks can affect more joints and happen more often (chronic gout). What are the causes?  Too much uric acid in your blood. This  can happen because: ? Your kidneys do not remove enough uric acid from your blood. ? Your body makes too much uric acid. ? You eat too many foods that are high in purines. These foods include organ meats, some seafood, and beer.  Trauma or stress. What increases the risk?  Having a family history of gout.  Being male and middle-aged.  Being male and having gone through menopause.  Being very overweight (obese).  Drinking alcohol, especially beer.  Not having enough water in the body (being dehydrated).  Losing weight too quickly.  Having an organ transplant.  Having lead poisoning.  Taking certain medicines.  Having kidney disease.  Having a skin condition called psoriasis. What are the signs or symptoms? An attack of acute gout usually happens in just one joint. The most common place is the big toe. Attacks often start at night. Other joints that may be affected include joints of the feet, ankle, knee, fingers, wrist, or elbow. Symptoms of an attack may include:  Very bad pain.  Warmth.  Swelling.  Stiffness.  Shiny, red, or purple skin.  Tenderness. The affected joint may be very painful to touch.  Chills and fever. Chronic gout may cause symptoms more often. More joints may be involved. You may also have white or yellow lumps (tophi) on your hands or feet or in other areas near your joints. How is this treated?  Treatment for this condition has two phases: treating an acute attack and preventing future attacks.  Acute gout treatment may include: ? NSAIDs. ? Steroids. These are taken by mouth or injected into a joint. ? Colchicine. This medicine relieves pain and swelling. It can be given by mouth or through an IV tube.  Preventive treatment may include: ? Taking small doses of NSAIDs or colchicine daily. ? Using a medicine that reduces uric acid levels in your blood. ? Making changes to your diet. You may need to see a food expert (dietitian) about  what to eat and drink to prevent gout. Follow these instructions at home: During a gout attack   If told, put ice on the painful area: ? Put ice in a plastic bag. ? Place a towel between your skin and the bag. ? Leave the ice on for 20 minutes, 2-3 times a day.  Raise (elevate) the painful joint above the level of your heart as often as you can.  Rest the joint as much as possible. If the joint is in your leg, you may be given crutches.  Follow instructions from your doctor about what you cannot eat or drink. Avoiding future gout attacks  Eat a low-purine diet. Avoid foods and drinks such as: ? Liver. ? Kidney. ? Anchovies. ?  Asparagus. ? Herring. ? Mushrooms. ? Mussels. ? Beer.  Stay at a healthy weight. If you want to lose weight, talk with your doctor. Do not lose weight too fast.  Start or continue an exercise plan as told by your doctor. Eating and drinking  Drink enough fluids to keep your pee (urine) pale yellow.  If you drink alcohol: ? Limit how much you use to:  0-1 drink a day for women.  0-2 drinks a day for men. ? Be aware of how much alcohol is in your drink. In the [place] one drink equals one 12 oz bottle of beer (355 mL), one 5 oz glass of wine (148 mL), or one 1 oz glass of hard liquor (44 mL). General instructions  Take over-the-counter and prescription medicines only as told by your doctor.  Do not drive or use heavy machinery while taking prescription pain medicine.  Return to your normal activities as told by your doctor. Ask your doctor what activities are safe for you.  Keep all follow-up visits as told by your doctor. This is important. Contact a doctor if:  You have another gout attack.  You still have symptoms of a gout attack after 10 days of treatment.  You have problems (side effects) because of your medicines.  You have chills or a fever.  You have burning pain when you pee (urinate).  You have pain in your lower back or  belly. Get help right away if:  You have very bad pain.  Your pain cannot be controlled.  You cannot pee. Summary  Gout is painful swelling of the joints.  The most common site of pain is the big toe, but it can affect other joints.  Medicines and avoiding some foods can help to prevent and treat gout attacks. This information is not intended to replace advice given to you by your health care provider. Make sure you discuss any questions you have with your health care provider. Document Released: 10/28/2007 Document Revised: 08/10/2017 Document Reviewed: 08/10/2017 Elsevier Patient Education  2020 [employer].

## 2018-11-13 NOTE — Progress Notes (Signed)
[name] 58 y.o.   Chief Complaint  Patient presents with   Toe Pain    RIGHT GREAT - per patient morning    HISTORY OF PRESENT ILLNESS: This is a 58 y.o. male complaining of pain to right great toe area that started 2 days ago.  Denies injury.  No other significant symptoms. Has history of paroxysmal A. fib and sleep apnea.  Not taking any medications at present time.  HPI   Prior to Admission medications   Medication Sig Start Date End Date Taking? Authorizing Provider  sildenafil (VIAGRA) 100 MG tablet Take 0.5-1 tablets (50-100 mg total) by mouth daily as needed for erectile dysfunction. 11/01/17  Yes Juliocesar Blasius, Ines Bloomer, MD  diltiazem (CARDIZEM CD) 120 MG 24 hr capsule TAKE 1 CAPSULE BY MOUTH EVERY DAY Patient not taking: Reported on 11/13/2018 12/27/17   Constance Haw, MD  flecainide (TAMBOCOR) 100 MG tablet Take 50 mg by mouth 2 (two) times daily.     [provider]  predniSONE (DELTASONE) 50 MG tablet One daily with food Patient not taking: Reported on 11/13/2018 03/17/18   Robyn Haber, MD    No Known Allergies  Patient Active Problem List   Diagnosis Date Noted   Obesity (BMI 30.0-34.9) 03/27/2018   Obstructive sleep apnea on CPAP 11/01/2017   Erectile dysfunction 11/01/2017   Paroxysmal atrial fibrillation ([place]) 03/03/2017    Past Medical History:  Diagnosis Date   Atrial fibrillation ([place])    a. 04/2017 s/p PVI.   Cataract    left    GERD (gastroesophageal reflux disease)    Sleep apnea    "waiting on my CPAP" (05/23/2017)    Past Surgical History:  Procedure Laterality Date   ATRIAL FIBRILLATION ABLATION N/A 05/06/2017   Procedure: ATRIAL FIBRILLATION ABLATION;  Surgeon: Constance Haw, MD;  Location: [place] [facility];  Service: Cardiovascular;  Laterality: N/A;   CARDIOVERSION N/A 03/04/2017   Procedure: CARDIOVERSION;  Surgeon: Adrian Prows, MD;  Location: [place];  Service: Cardiovascular;   Laterality: N/A;   LEFT HEART CATH AND CORONARY ANGIOGRAPHY N/A 05/24/2017   Procedure: LEFT HEART CATH AND CORONARY ANGIOGRAPHY possible angioplasty;  Surgeon: Nigel Mormon, MD;  Location: [place] [facility];  Service: Cardiovascular;  Laterality: N/A;   WISDOM TOOTH EXTRACTION     3 removed - 1 remains    Social History   Socioeconomic History   Marital status: Divorced    Spouse name: Not on file   Number of children: 2   Years of education: Not on file   Highest education level: Not on file  Occupational History   Not on file  Social Needs   Financial resource strain: Not on file   Food insecurity    Worry: Not on file    Inability: Not on file   Transportation needs    Medical: Not on file    Non-medical: Not on file  Tobacco Use   Smoking status: Former Smoker    Packs/day: 1.00    Years: 20.00    Pack years: 20.00    Types: Cigarettes    Quit date: 1998    Years since quitting: 22.7   Smokeless tobacco: Never Used  Substance and Sexual Activity   Alcohol use: Yes    Alcohol/week: 3.0 standard drinks    Types: 3 Glasses of wine per week   Drug use: No   Sexual activity: Yes  Lifestyle   Physical activity    Days  per week: Not on file    Minutes per session: Not on file   Stress: Not on file  Relationships   Social connections    Talks on phone: Not on file    Gets together: Not on file    Attends religious service: Not on file    Active member of club or organization: Not on file    Attends meetings of clubs or organizations: Not on file    Relationship status: Not on file   Intimate partner violence    Fear of current or ex partner: Not on file    Emotionally abused: Not on file    Physically abused: Not on file    Forced sexual activity: Not on file  Other Topics Concern   Not on file  Social History Narrative   ** Merged History Encounter **        Family History  Problem Relation Age of Onset   Diabetes Mother      Hyperlipidemia Mother    Diabetes Father    Hyperlipidemia Father    Esophageal cancer Father    Drug abuse Sister    Hyperlipidemia Maternal Grandmother    Colon cancer Neg Hx    Pancreatic cancer Neg Hx    Prostate cancer Neg Hx    Rectal cancer Neg Hx    Stomach cancer Neg Hx      Review of Systems  Constitutional: Negative.  Negative for chills and fever.  HENT: Negative.  Negative for congestion and sore throat.   Respiratory: Negative.  Negative for cough and shortness of breath.   Cardiovascular: Negative.  Negative for chest pain and palpitations.  Gastrointestinal: Negative.  Negative for abdominal pain, nausea and vomiting.  Genitourinary: Negative.   Musculoskeletal:       Right great toe pain  Skin: Negative.  Negative for rash.  Neurological: Negative for dizziness and headaches.  All other systems reviewed and are negative.   Vitals:   11/13/18 1344  BP: (!) 146/67  Pulse: 70  Resp: 16  Temp: 98.3 F (36.8 C)  SpO2: 98%    Physical Exam Vitals signs reviewed.  Constitutional:      Appearance: Normal appearance.  HENT:     Head: Normocephalic.  Eyes:     Extraocular Movements: Extraocular movements intact.     Pupils: Pupils are equal, round, and reactive to light.  Neck:     Musculoskeletal: Normal range of motion.  Cardiovascular:     Rate and Rhythm: Normal rate and regular rhythm.     Pulses: Normal pulses.     Heart sounds: Normal heart sounds.  Pulmonary:     Effort: Pulmonary effort is normal.     Breath sounds: Normal breath sounds.  Musculoskeletal: Normal range of motion.     Comments: Right great toe: Positive erythema and swelling with tenderness to palpation at MTP joint.  Compatible with gout  Skin:    General: Skin is warm and dry.     Capillary Refill: Capillary refill takes less than 2 seconds.  Neurological:     General: No focal deficit present.     Mental Status: He is alert and oriented to person, place,  and time.  Psychiatric:        Mood and Affect: Mood normal.        Behavior: Behavior normal.      A total of 25 minutes was spent in the room with the patient, greater than 50% of which was  in counseling/coordination of care regarding differential diagnosis including gout, treatment, diet and nutrition, medication side effects, prognosis, and need for follow-up if no better or worse in the next several days.   ASSESSMENT & PLAN: Gid was seen today for toe pain.  Diagnoses and all orders for this visit:  Acute gouty arthritis -     colchicine 0.6 MG tablet; Take 1.2 mg now and 0.6 mg one hour later. Take 0.6 mg daily after that x 5 days. -     indomethacin (INDOCIN) 50 MG capsule; Take 1 capsule (50 mg total) by mouth 3 (three) times daily with meals for 3 days.  History of atrial fibrillation  History of sleep apnea    Patient Instructions       If you have lab work done today you will be contacted with your lab results within the next 2 weeks.  If you have not heard from [place] then please contact us. The fastest way to get your results is to register for My Chart.   IF you received an x-ray today, you will receive an invoice from [facility]. Please contact [facility] at [phone] with questions or concerns regarding your invoice.   IF you received labwork today, you will receive an invoice from [facility]. Please contact [facility] at [phone] with questions or concerns regarding your invoice.   Our billing staff will not be able to assist you with questions regarding bills from these companies.  You will be contacted with the lab results as soon as they are available. The fastest way to get your results is to activate your My Chart account. Instructions are located on the last page of this paperwork. If you have not heard from [place] regarding the results in 2 weeks, please contact this office.     Gota Gout  La gota es una hinchazn dolorosa de  las articulaciones. La gota es un tipo de artritis. Es causada por el exceso de cido rico en el cuerpo. El cido rico es una sustancia qumica que se produce cuando el cuerpo descompone sustancias llamadas purinas. Si el cuerpo tiene un exceso de cido rico, se pueden formar cristales con punta y acumularse en las articulaciones. Esto provoca dolor e hinchazn. Las crisis de gota pueden ocurrir rpidamente y ser muy dolorosas (gota [place]). Con el tiempo, las crisis pueden afectar ms articulaciones y ocurrir con mayor frecuencia (gota crnica). Cules son las causas?  El exceso de cido rico en la [place]. Esto puede ocurrir debido a lo siguiente: ? Los riones no eliminan la cantidad suficiente de cido rico de Herbalist. ? El cuerpo produce demasiado cido rico. ? Come demasiados alimentos ricos en purinas. Estos alimentos incluyen vsceras, algunos mariscos y Engineer, manufacturing systems.  Un traumatismo o estrs. Qu incrementa el riesgo?  Tener antecedentes familiares de gota.  Ser hombre de [employer].  Ser mujer y haber atravesado la menopausia.  Tener mucho sobrepeso (obesidad).  Beber alcohol, en especial, cerveza.  No tener la cantidad suficiente de agua en el organismo (estar deshidratado).  Perder peso con demasiada rapidez.  Haber tenido un trasplante de rgano.  Haberse intoxicado con plomo.  Tomar ciertos medicamentos.  Tener enfermedades renales.  Tener una afeccin de la piel llamada psoriasis. Cules son los signos o los sntomas? Ardelia Mems crisis de gota aguda generalmente ocurre solo en Insurance claims handler. El lugar ms comn es el pulgar del pie. Las crisis a menudo comienzan por la noche. Tambin pueden verse afectadas las articulaciones de los  pies, los tobillos, las rodillas, los dedos de la mano, las muecas o los codos. Los sntomas de una crisis pueden incluir lo siguiente:  Dolor intenso.  Calor.  Hinchazn.  Entumecimiento.  Piel brillosa, roja o  morada.  Dolor a Secretary/administrator. Se puede sentir mucho dolor al tacto en la articulacin afectada.  Cristy Hilts y escalofros. La gota crnica puede provocar sntomas con ms frecuencia. Pueden verse involucradas ms articulaciones. Es posible que tambin tenga bultos blancos o amarillos (tofos) en las manos o los pies, o en otras zonas cercanas a las articulaciones. Cmo se trata?  El tratamiento para esta afeccin tiene dos fases: tratar un ataque agudo y prevenir ataques futuros.  El tratamiento de la gota aguda puede incluir lo siguiente: ? Antiinflamatorios no esteroideos (AINE). ? Corticoesteroides. Se toman por la boca o se pueden inyectar en una articulacin. ? Colchicina. Este medicamento [employer] dolor y la hinchazn. Puede administrarse por la boca o por una va intravenosa.  El tratamiento preventivo puede incluir lo siguiente: ? El uso diario de dosis bajas de antiinflamatorios no esteroideos (AINE) o colchicina. ? El uso de un medicamento que reduce los niveles de cido rico en la [place]. ? Realizar cambios en la dieta. Es posible que deba consultar a un Research officer, political party (nutricionista) acerca de lo que debe comer y beber para Biochemist, clinical. Siga estas indicaciones en su casa: Durante una crisis de gota   Si se lo indican, aplique hielo sobre la zona dolorida: ? Field seismologist hielo en una bolsa plstica. ? Coloque una [employer] piel y Therapist, nutritional. ? Coloque el hielo durante 3minutos, 2 a 3veces por da.  Levante (eleve) la articulacin dolorida por encima del nivel del corazn tan frecuentemente como sea posible.  Haga reposo todo el tiempo que pueda. Si la articulacin est en la pierna, quiz deba usar muletas.  Siga las indicaciones del mdico respecto de lo que no puede comer o beber. Cmo evitar las crisis de gota en el futuro  Siga una dieta con bajo contenido de purinas. Evite el consumo de alimentos y bebidas como los  siguientes: ? Hgado. ? Rin. ? Anchoas. ? Esprragos. ? Arenque. ? Hongos. ? Mejillones. ? Cerveza.  Mantenga un peso saludable. Si desea adelgazar, hable con el mdico. No baje de peso demasiado rpido.  Comience o contine con un plan de ejercicios como se lo haya indicado el mdico. Comida y bebida  Beba suficiente lquido para mantener el pis (la [place]) de color amarillo plido.  Si bebe alcohol: ? Limite la cantidad que bebe a lo siguiente:  De 0 a 1 medida por da para las mujeres.  De 0 a 2 medidas por da para los hombres. ? Est atento a la cantidad de alcohol que hay en las bebidas que toma. En los [place], una medida equivale a una botella de cerveza de 12oz (370ml), un vaso de vino de 5oz (112ml) o un vaso de una bebida alcohlica de alta graduacin de 1oz (22ml). Indicaciones generales  [employer] de venta libre y los recetados solamente como se lo haya indicado el mdico.  No conduzca ni use maquinaria pesada mientras toma analgsicos recetados.  Retome sus actividades normales segn lo indicado por el mdico. Pregntele al mdico qu actividades son seguras para usted.  Concurra a todas las visitas de control como se lo haya indicado el mdico. Esto es importante. Comunquese con un mdico si:  Tiene otra crisis de gota.  Sigue teniendo sntomas de [place] crisis de gota despus de 10das de tratamiento.  Tiene problemas (efectos secundarios) debido a los medicamentos.  Siente escalofros o tiene fiebre.  Tiene sensacin de [employer] al [employer].  Siente dolor en la parte inferior de la espalda o en el abdomen. Solicite ayuda inmediatamente si:  Interior and spatial designer.  No es posible Financial controller.  No puede hacer pis. Resumen  La gota es una hinchazn dolorosa de las articulaciones.  El lugar ms habitual donde se presenta el dolor es el dedo gordo del pie, pero tambin pueden verse afectadas otras articulaciones.  Los  medicamentos y evitar algunos alimentos pueden ayudar a prevenir y tratar las crisis de [place]. Esta informacin no tiene Marine scientist el consejo del mdico. Asegrese de hacerle al mdico cualquier pregunta que tenga. Document Released: 01/07/2011 Document Revised: 09/21/2017 Document Reviewed: 09/21/2017 Elsevier Patient Education  2020 [place].  Gout  Gout is painful swelling of your joints. Gout is a type of arthritis. It is caused by having too much uric acid in your body. Uric acid is a chemical that is made when your body breaks down substances called purines. If your body has too much uric acid, sharp crystals can form and build up in your joints. This causes pain and swelling. Gout attacks can happen quickly and be very painful (acute gout). Over time, the attacks can affect more joints and happen more often (chronic gout). What are the causes?  Too much uric acid in your blood. This can happen because: ? Your kidneys do not remove enough uric acid from your blood. ? Your body makes too much uric acid. ? You eat too many foods that are high in purines. These foods include organ meats, some seafood, and beer.  Trauma or stress. What increases the risk?  Having a family history of gout.  Being male and middle-aged.  Being male and having gone through menopause.  Being very overweight (obese).  Drinking alcohol, especially beer.  Not having enough water in the body (being dehydrated).  Losing weight too quickly.  Having an organ transplant.  Having lead poisoning.  Taking certain medicines.  Having kidney disease.  Having a skin condition called psoriasis. What are the signs or symptoms? An attack of acute gout usually happens in just one joint. The most common place is the big toe. Attacks often start at night. Other joints that may be affected include joints of the feet, ankle, knee, fingers, wrist, or elbow. Symptoms of an attack may include:  Very  bad pain.  Warmth.  Swelling.  Stiffness.  Shiny, red, or purple skin.  Tenderness. The affected joint may be very painful to touch.  Chills and fever. Chronic gout may cause symptoms more often. More joints may be involved. You may also have white or yellow lumps (tophi) on your hands or feet or in other areas near your joints. How is this treated?  Treatment for this condition has two phases: treating an acute attack and preventing future attacks.  Acute gout treatment may include: ? NSAIDs. ? Steroids. These are taken by mouth or injected into a joint. ? Colchicine. This medicine relieves pain and swelling. It can be given by mouth or through an IV tube.  Preventive treatment may include: ? Taking small doses of NSAIDs or colchicine daily. ? Using a medicine that reduces uric acid levels in your blood. ? Making changes to your diet. You may need to see a food expert (dietitian)  about what to eat and drink to prevent gout. Follow these instructions at home: During a gout attack   If told, put ice on the painful area: ? Put ice in a plastic bag. ? Place a towel between your skin and the bag. ? Leave the ice on for 20 minutes, 2-3 times a day.  Raise (elevate) the painful joint above the level of your heart as often as you can.  Rest the joint as much as possible. If the joint is in your leg, you may be given crutches.  Follow instructions from your doctor about what you cannot eat or drink. Avoiding future gout attacks  Eat a low-purine diet. Avoid foods and drinks such as: ? Liver. ? Kidney. ? Anchovies. ? Asparagus. ? Herring. ? Mushrooms. ? Mussels. ? Beer.  Stay at a healthy weight. If you want to lose weight, talk with your doctor. Do not lose weight too fast.  Start or continue an exercise plan as told by your doctor. Eating and drinking  Drink enough fluids to keep your pee (urine) pale yellow.  If you drink alcohol: ? Limit how much you use  to:  0-1 drink a day for women.  0-2 drinks a day for men. ? Be aware of how much alcohol is in your drink. In the [place] one drink equals one 12 oz bottle of beer (355 mL), one 5 oz glass of wine (148 mL), or one 1 oz glass of hard liquor (44 mL). General instructions  Take over-the-counter and prescription medicines only as told by your doctor.  Do not drive or use heavy machinery while taking prescription pain medicine.  Return to your normal activities as told by your doctor. Ask your doctor what activities are safe for you.  Keep all follow-up visits as told by your doctor. This is important. Contact a doctor if:  You have another gout attack.  You still have symptoms of a gout attack after 10 days of treatment.  You have problems (side effects) because of your medicines.  You have chills or a fever.  You have burning pain when you pee (urinate).  You have pain in your lower back or belly. Get help right away if:  You have very bad pain.  Your pain cannot be controlled.  You cannot pee. Summary  Gout is painful swelling of the joints.  The most common site of pain is the big toe, but it can affect other joints.  Medicines and avoiding some foods can help to prevent and treat gout attacks. This information is not intended to replace advice given to you by your health care provider. Make sure you discuss any questions you have with your health care provider. Document Released: 10/28/2007 Document Revised: 08/10/2017 Document Reviewed: 08/10/2017 Elsevier Patient Education  2020 Elsevier Inc.      Agustina Caroli, MD [facility]

## 2018-11-23 ENCOUNTER — Other Ambulatory Visit: Payer: Self-pay

## 2018-11-23 DIAGNOSIS — Z20822 Contact with and (suspected) exposure to covid-19: Secondary | ICD-10-CM

## 2018-11-25 LAB — NOVEL CORONAVIRUS, NAA: SARS-CoV-2, NAA: NOT DETECTED

## 2019-01-03 ENCOUNTER — Ambulatory Visit (INDEPENDENT_AMBULATORY_CARE_PROVIDER_SITE_OTHER): Payer: BC Managed Care – PPO | Admitting: Emergency Medicine

## 2019-01-03 ENCOUNTER — Other Ambulatory Visit: Payer: Self-pay

## 2019-01-03 ENCOUNTER — Encounter: Payer: Self-pay | Admitting: Emergency Medicine

## 2019-01-03 VITALS — BP 120/68 | HR 67 | Temp 98.0°F | Resp 16 | Ht 67.0 in | Wt 230.0 lb

## 2019-01-03 DIAGNOSIS — Z1329 Encounter for screening for other suspected endocrine disorder: Secondary | ICD-10-CM

## 2019-01-03 DIAGNOSIS — Z130 Encounter for screening for diseases of the blood and blood-forming organs and certain disorders involving the immune mechanism: Secondary | ICD-10-CM | POA: Diagnosis not present

## 2019-01-03 DIAGNOSIS — R7303 Prediabetes: Secondary | ICD-10-CM

## 2019-01-03 DIAGNOSIS — Z8669 Personal history of other diseases of the nervous system and sense organs: Secondary | ICD-10-CM

## 2019-01-03 DIAGNOSIS — Z125 Encounter for screening for malignant neoplasm of prostate: Secondary | ICD-10-CM | POA: Diagnosis not present

## 2019-01-03 DIAGNOSIS — Z8739 Personal history of other diseases of the musculoskeletal system and connective tissue: Secondary | ICD-10-CM

## 2019-01-03 DIAGNOSIS — Z0001 Encounter for general adult medical examination with abnormal findings: Secondary | ICD-10-CM

## 2019-01-03 DIAGNOSIS — Z13228 Encounter for screening for other metabolic disorders: Secondary | ICD-10-CM

## 2019-01-03 DIAGNOSIS — I480 Paroxysmal atrial fibrillation: Secondary | ICD-10-CM

## 2019-01-03 DIAGNOSIS — Z0000 Encounter for general adult medical examination without abnormal findings: Secondary | ICD-10-CM

## 2019-01-03 DIAGNOSIS — R351 Nocturia: Secondary | ICD-10-CM

## 2019-01-03 DIAGNOSIS — Z13220 Encounter for screening for lipoid disorders: Secondary | ICD-10-CM

## 2019-01-03 NOTE — Patient Instructions (Addendum)
If you have lab work done today you will be contacted with your lab results within the next 2 weeks.  If you have not heard from [place] then please contact us. The fastest way to get your results is to register for My Chart.   IF you received an x-ray today, you will receive an invoice from [facility]. Please contact [facility] at [phone] with questions or concerns regarding your invoice.   IF you received labwork today, you will receive an invoice from [place]. Please contact [facility] at [phone] with questions or concerns regarding your invoice.   Our billing staff will not be able to assist you with questions regarding bills from these companies.  You will be contacted with the lab results as soon as they are available. The fastest way to get your results is to activate your My Chart account. Instructions are located on the last page of this paperwork. If you have not heard from [place] regarding the results in 2 weeks, please contact this office.        If you have lab work done today you will be contacted with your lab results within the next 2 weeks.  If you have not heard from [place] then please contact us. The fastest way to get your results is to register for My Chart.   IF you received an x-ray today, you will receive an invoice from [facility]. Please contact [facility] Radiology at [phone] with questions or concerns regarding your invoice.   IF you received labwork today, you will receive an invoice from [place]. Please contact [facility] at [phone] with questions or concerns regarding your invoice.   Our billing staff will not be able to assist you with questions regarding bills from these companies.  You will be contacted with the lab results as soon as they are available. The fastest way to get your results is to activate your My Chart account. Instructions are located on the last page of this paperwork. If you have not heard from [place]  regarding the results in 2 weeks, please contact this office.      Mantenimiento de Teacher, English as a foreign language, Male Adoptar un estilo de vida saludable y recibir atencin preventiva son importantes para promover la salud y Musician. Consulte al mdico sobre:  El esquema adecuado para hacerse pruebas y exmenes peridicos.  Cosas que puede hacer por su cuenta para prevenir enfermedades y [place] sano. Qu debo saber sobre la dieta, el peso y el ejercicio? Consuma una dieta saludable   Consuma una dieta que incluya muchas verduras, frutas, productos lcteos con bajo contenido de [place] y Advertising account planner.  No consuma muchos alimentos ricos en grasas slidas, azcares agregados o sodio. Mantenga un peso saludable El ndice de masa muscular [facility]) es una medida que puede utilizarse para identificar posibles problemas de [place]. Proporciona una estimacin de la grasa corporal basndose en el peso y la altura. Su mdico puede ayudarle a Radiation protection practitioner [place] y a Scientist, forensic o Theatre manager un peso saludable. Haga ejercicio con regularidad Haga ejercicio con regularidad. Esta es una de las prcticas ms importantes que puede hacer por su salud. La mayora de los adultos deben seguir estas pautas:  Optometrist, al menos, 162minutos de actividad fsica por semana. El ejercicio debe aumentar la frecuencia cardaca y Nature conservation officer transpirar (ejercicio de intensidad moderada).  Hacer ejercicios de fortalecimiento por lo [employer] por semana. Agregue esto a su plan de ejercicio de intensidad moderada.  Pasar menos tiempo sentados. Incluso la actividad fsica ligera puede ser beneficiosa. Controle sus niveles de colesterol y lpidos en la sangre Comience a realizarse anlisis de lpidos y Research officer, trade union en la sangre a los 20aos y luego reptalos cada 5aos. Es posible que Automotive engineer los niveles de colesterol con mayor frecuencia si:  Sus niveles de lpidos y colesterol son  altos.  Es mayor de 40aos.  Presenta un alto riesgo de padecer enfermedades cardacas. Qu debo saber sobre las pruebas de deteccin del cncer? Muchos tipos de cncer pueden detectarse de manera temprana y, a menudo, pueden prevenirse. Segn su historia clnica y sus antecedentes familiares, es posible que deba realizarse pruebas de deteccin del cncer en diferentes edades. Esto puede incluir pruebas de deteccin de lo siguiente:  Surveyor, minerals.  Cncer de prstata.  Cncer de piel.  Cncer de pulmn. Qu debo saber sobre la enfermedad cardaca, la diabetes y la hipertensin arterial? Presin arterial y enfermedad cardaca  La hipertensin arterial causa enfermedades cardacas y [place] el riesgo de accidente cerebrovascular. Es ms probable que esto se manifieste en las personas que tienen lecturas de presin arterial alta, tienen ascendencia africana o tienen sobrepeso.  Hable con el mdico sobre sus valores de presin arterial deseados.  Hgase controlar la presin arterial: ? Cada 3 a 5 aos si tiene entre 18 y 52 aos. ? Todos los aos si es mayor de [place].  Si tiene entre 29 y 53 aos y es fumador o Insurance account manager, pregntele al mdico si debe realizarse una prueba de deteccin de aneurisma artico abdominal (AAA) por nica vez. Diabetes Realcese exmenes de deteccin de la diabetes con regularidad. Este anlisis revisa el nivel de azcar en la sangre en [place]. Hgase las pruebas de deteccin:  Cada tresaos despus de los 11aos de edad si tiene un peso normal y un bajo riesgo de padecer diabetes.  Con ms frecuencia y a partir de [place] edad inferior si tiene sobrepeso o un alto riesgo de padecer diabetes. Qu debo saber sobre la prevencin de infecciones? Hepatitis B Si tiene un riesgo ms alto de contraer hepatitis B, debe someterse a un examen de deteccin de este virus. Hable con el mdico para averiguar si tiene riesgo de contraer la infeccin por hepatitis  B. Hepatitis C Se recomienda un anlisis de [place] para:  Todos los que nacieron entre 1946 y [id number].  Todas las personas que tengan un riesgo de haber contrado hepatitis C. Enfermedades de transmisin sexual (ETS)  Debe realizarse pruebas de deteccin de ITS todos los aos, incluidas la gonorrea y la clamidia, si: ? Es sexualmente activo y es menor de 24aos. ? Es mayor de 24aos, y Investment banker, operational informa que corre riesgo de tener este tipo de infecciones. ? La actividad sexual ha cambiado desde que le hicieron la ltima prueba de deteccin y tiene un riesgo mayor de Best boy clamidia o Radio broadcast assistant. Pregntele al mdico si usted tiene riesgo.  Pregntele al mdico si usted tiene un alto riesgo de Museum/gallery curator VIH. El mdico tambin puede recomendarle un medicamento recetado para ayudar a evitar la infeccin por el VIH. Si elige tomar medicamentos para prevenir el VIH, primero debe [employer] de deteccin del VIH. Luego debe hacerse anlisis cada 61meses mientras est tomando los medicamentos. Siga estas instrucciones en su casa: Estilo de vida  No consuma ningn producto que contenga nicotina o tabaco, como cigarrillos, cigarrillos electrnicos y tabaco de Higher education careers adviser. Si necesita ayuda para dejar de fumar, consulte al mdico.  No consuma drogas.  No comparta agujas.  Solicite ayuda a su mdico si necesita apoyo o informacin para abandonar las drogas. Consumo de alcohol  No beba alcohol si el mdico se lo prohbe.  Si bebe alcohol: ? Limite la cantidad que consume de 0 a 2 medidas por da. ? Est atento a la cantidad de alcohol que hay en las bebidas que toma. En los [place], una medida equivale a una botella de cerveza de 12oz (354ml), un vaso de vino de 5oz (172ml) o un vaso de una bebida alcohlica de alta graduacin de 1oz (76ml). Instrucciones generales  Realcese los estudios de rutina de la salud, dentales y de Public librarian.  [place].  Infrmele a su mdico si: ? Se siente deprimido con frecuencia. ? Alguna vez ha sido vctima de [place] o no se siente seguro en su casa. Resumen  Adoptar un estilo de vida saludable y recibir atencin preventiva son importantes para promover la salud y Musician.  Siga las instrucciones del mdico acerca de una dieta saludable, el ejercicio y la realizacin de pruebas o exmenes para Engineer, building services.  Siga las instrucciones del mdico con respecto al control del colesterol y la presin arterial. Esta informacin no tiene Marine scientist el consejo del mdico. Asegrese de hacerle al mdico cualquier pregunta que tenga. Document Released: 07/17/2007 Document Revised: 02/08/2018 Document Reviewed: 02/08/2018 Elsevier Patient Education  2020 [employer].

## 2019-01-03 NOTE — Progress Notes (Addendum)
[facility] Evan Page 58 y.o.   Chief Complaint  Patient presents with  . Annual Exam    HISTORY OF PRESENT ILLNESS: This is a 58 y.o. male here for his annual exam.  Has the following chronic medical problems: 1.  Paroxysmal atrial fibrillation: No medications.  Cleared by cardiologist. 2.  Obstructive sleep apnea: On CPAP therapy. 3.  History of gout: On no chronic medications.  Will check uric acid levels today. Has no complaints or medical concerns today.  HPI   Prior to Admission medications   Medication Sig Start Date End Date Taking? Authorizing Provider  sildenafil (VIAGRA) 100 MG tablet Take 0.5-1 tablets (50-100 mg total) by mouth daily as needed for erectile dysfunction. 11/01/17  Yes Nathan Moctezuma, Ines Bloomer, MD  colchicine 0.6 MG tablet Take 1.2 mg now and 0.6 mg one hour later. Take 0.6 mg daily after that x 5 days. Patient not taking: Reported on 01/03/2019 11/13/18   Horald Pollen, MD  diltiazem (CARDIZEM CD) 120 MG 24 hr capsule TAKE 1 CAPSULE BY MOUTH EVERY DAY Patient not taking: Reported on 01/03/2019 12/27/17   Constance Haw, MD  predniSONE (DELTASONE) 50 MG tablet One daily with food Patient not taking: Reported on 01/03/2019 03/17/18   Robyn Haber, MD    No Known Allergies  Patient Active Problem List   Diagnosis Date Noted  . Obesity (BMI 30.0-34.9) 03/27/2018  . Obstructive sleep apnea on CPAP 11/01/2017  . Erectile dysfunction 11/01/2017  . Paroxysmal atrial fibrillation ([place]) 03/03/2017    Past Medical History:  Diagnosis Date  . Atrial fibrillation ([place])    a. 04/2017 s/p PVI.  Marland Kitchen Cataract    left   . GERD (gastroesophageal reflux disease)   . Sleep apnea    "waiting on my CPAP" (05/23/2017)    Past Surgical History:  Procedure Laterality Date  . ATRIAL FIBRILLATION ABLATION N/A 05/06/2017   Procedure: ATRIAL FIBRILLATION ABLATION;  Surgeon: Constance Haw, MD;  Location: [facility];  Service: Cardiovascular;   Laterality: N/A;  . CARDIOVERSION N/A 03/04/2017   Procedure: CARDIOVERSION;  Surgeon: Adrian Prows, MD;  Location: [place];  Service: Cardiovascular;  Laterality: N/A;  . LEFT HEART CATH AND CORONARY ANGIOGRAPHY N/A 05/24/2017   Procedure: LEFT HEART CATH AND CORONARY ANGIOGRAPHY possible angioplasty;  Surgeon: Nigel Mormon, MD;  Location: [facility];  Service: Cardiovascular;  Laterality: N/A;  . WISDOM TOOTH EXTRACTION     3 removed - 1 remains    Social History   Socioeconomic History  . Marital status: Divorced    Spouse name: Not on file  . Number of children: 2  . Years of education: Not on file  . Highest education level: Not on file  Occupational History  . Not on file  Social Needs  . Financial resource strain: Not on file  . Food insecurity    Worry: Not on file    Inability: Not on file  . Transportation needs    Medical: Not on file    Non-medical: Not on file  Tobacco Use  . Smoking status: Former Smoker    Packs/day: 1.00    Years: 20.00    Pack years: 20.00    Types: Cigarettes    Quit date: 1998    Years since quitting: 22.9  . Smokeless tobacco: Never Used  Substance and Sexual Activity  . Alcohol use: Yes    Alcohol/week: 3.0 standard drinks    Types: 3 Glasses of wine  per week  . Drug use: No  . Sexual activity: Yes  Lifestyle  . Physical activity    Days per week: Not on file    Minutes per session: Not on file  . Stress: Not on file  Relationships  . Social Herbalist on phone: Not on file    Gets together: Not on file    Attends religious service: Not on file    Active member of club or organization: Not on file    Attends meetings of clubs or organizations: Not on file    Relationship status: Not on file  . Intimate partner violence    Fear of current or ex partner: Not on file    Emotionally abused: Not on file    Physically abused: Not on file    Forced sexual activity: Not on file  Other Topics Concern   . Not on file  Social History Narrative   ** Merged History Encounter **        Family History  Problem Relation Age of Onset  . Diabetes Mother   . Hyperlipidemia Mother   . Diabetes Father   . Hyperlipidemia Father   . Esophageal cancer Father   . Drug abuse Sister   . Hyperlipidemia Maternal Grandmother   . Colon cancer Neg Hx   . Pancreatic cancer Neg Hx   . Prostate cancer Neg Hx   . Rectal cancer Neg Hx   . Stomach cancer Neg Hx      Review of Systems  Constitutional: Negative.  Negative for chills and fever.  HENT: Negative.  Negative for congestion and sore throat.   Eyes: Negative.   Respiratory: Negative.  Negative for cough and shortness of breath.   Cardiovascular: Negative.  Negative for chest pain and leg swelling.  Gastrointestinal: Negative for abdominal pain, blood in stool, diarrhea, nausea and vomiting.  Genitourinary: Negative.  Negative for dysuria and hematuria.       Nocturia  Musculoskeletal: Negative.  Negative for back pain, myalgias and neck pain.  Skin: Negative.  Negative for rash.  Neurological: Negative.  Negative for dizziness and headaches.  Psychiatric/Behavioral: Memory loss: .assesspl.  All other systems reviewed and are negative.  Today's Vitals   01/03/19 1533  BP: 120/68  Pulse: 67  Resp: 16  Temp: 98 F (36.7 C)  TempSrc: Oral  SpO2: 97%  Weight: 230 lb (104.3 kg)  Height: 5\' 7"  (1.702 m)   Body mass index is 36.02 kg/m.   Physical Exam Constitutional:      Appearance: Normal appearance.  HENT:     Head: Normocephalic.  Eyes:     Extraocular Movements: Extraocular movements intact.     Conjunctiva/sclera: Conjunctivae normal.     Pupils: Pupils are equal, round, and reactive to light.  Neck:     Musculoskeletal: Normal range of motion and neck supple.  Cardiovascular:     Rate and Rhythm: Normal rate and regular rhythm.     Pulses: Normal pulses.     Heart sounds: Normal heart sounds.  Pulmonary:      Effort: Pulmonary effort is normal.     Breath sounds: Normal breath sounds.  Abdominal:     General: There is no distension.     Palpations: Abdomen is soft. There is no mass.     Tenderness: There is no abdominal tenderness.  Musculoskeletal: Normal range of motion.  Skin:    General: Skin is warm and dry.  Capillary Refill: Capillary refill takes less than 2 seconds.  Neurological:     General: No focal deficit present.     Mental Status: He is alert and oriented to person, place, and time.  Psychiatric:        Mood and Affect: Mood normal.        Behavior: Behavior normal.      ASSESSMENT & PLAN: Evan Page was seen today for annual exam.  Diagnoses and all orders for this visit:  Routine general medical examination at a health care facility  Prostate cancer screening -     PSA(Must document that pt has been informed of limitations of PSA testing.)  History of gout -     Uric Acid  Screening for deficiency anemia -     CBC with Differential  Screening for lipoid disorders -     Lipid panel  Screening for endocrine, metabolic and immunity disorder -     Comprehensive metabolic panel -     Hemoglobin A1c  Paroxysmal atrial fibrillation ([place])  History of sleep apnea  Nocturia  Prediabetes     Patient Instructions       If you have lab work done today you will be contacted with your lab results within the next 2 weeks.  If you have not heard from [place] then please contact us. The fastest way to get your results is to register for My Chart.   IF you received an x-ray today, you will receive an invoice from [facility]. Please contact [facility] Radiology at [phone] with questions or concerns regarding your invoice.   IF you received labwork today, you will receive an invoice from [facility]. Please contact [facility] at [phone] with questions or concerns regarding your invoice.   Our billing staff will not be able to assist you with questions  regarding bills from these companies.  You will be contacted with the lab results as soon as they are available. The fastest way to get your results is to activate your My Chart account. Instructions are located on the last page of this paperwork. If you have not heard from [place] regarding the results in 2 weeks, please contact this office.        If you have lab work done today you will be contacted with your lab results within the next 2 weeks.  If you have not heard from [place] then please contact us. The fastest way to get your results is to register for My Chart.   IF you received an x-ray today, you will receive an invoice from [facility] Radiology. Please contact [facility] Radiology at [phone] with questions or concerns regarding your invoice.   IF you received labwork today, you will receive an invoice from [facility]. Please contact [facility] at [phone] with questions or concerns regarding your invoice.   Our billing staff will not be able to assist you with questions regarding bills from these companies.  You will be contacted with the lab results as soon as they are available. The fastest way to get your results is to activate your My Chart account. Instructions are located on the last page of this paperwork. If you have not heard from [place] regarding the results in 2 weeks, please contact this office.      Mantenimiento de Teacher, English as a foreign language, Male Adoptar un estilo de vida saludable y recibir atencin preventiva son importantes para promover la salud y Musician. Consulte al mdico sobre:  El esquema adecuado para  hacerse pruebas y exmenes peridicos.  Cosas que puede hacer por su cuenta para prevenir enfermedades y [place] sano. Qu debo saber sobre la dieta, el peso y el ejercicio? Consuma una dieta saludable   Consuma una dieta que incluya muchas verduras, frutas, productos lcteos con bajo contenido de [place] y Advertising account planner.  No  consuma muchos alimentos ricos en grasas slidas, azcares agregados o sodio. Mantenga un peso saludable El ndice de masa muscular [facility]) es una medida que puede utilizarse para identificar posibles problemas de [place]. Proporciona una estimacin de la grasa corporal basndose en el peso y la altura. Su mdico puede ayudarle a Radiation protection practitioner [place] y a Scientist, forensic o Theatre manager un peso saludable. Haga ejercicio con regularidad Haga ejercicio con regularidad. Esta es una de las prcticas ms importantes que puede hacer por su salud. La mayora de los adultos deben seguir estas pautas:  Optometrist, al menos, 152minutos de actividad fsica por semana. El ejercicio debe aumentar la frecuencia cardaca y Nature conservation officer transpirar (ejercicio de intensidad moderada).  Hacer ejercicios de fortalecimiento por lo [employer] por semana. Agregue esto a su plan de ejercicio de intensidad moderada.  Pasar menos tiempo sentados. Incluso la actividad fsica ligera puede ser beneficiosa. Controle sus niveles de colesterol y lpidos en la sangre Comience a realizarse anlisis de lpidos y Research officer, trade union en la sangre a los 20aos y luego reptalos cada 5aos. Es posible que Automotive engineer los niveles de colesterol con mayor frecuencia si:  Sus niveles de lpidos y colesterol son altos.  Es mayor de 40aos.  Presenta un alto riesgo de padecer enfermedades cardacas. Qu debo saber sobre las pruebas de deteccin del cncer? Muchos tipos de cncer pueden detectarse de manera temprana y, a menudo, pueden prevenirse. Segn su historia clnica y sus antecedentes familiares, es posible que deba realizarse pruebas de deteccin del cncer en diferentes edades. Esto puede incluir pruebas de deteccin de lo siguiente:  Surveyor, minerals.  Cncer de prstata.  Cncer de piel.  Cncer de pulmn. Qu debo saber sobre la enfermedad cardaca, la diabetes y la hipertensin arterial? Presin arterial y enfermedad cardaca  La  hipertensin arterial causa enfermedades cardacas y [place] el riesgo de accidente cerebrovascular. Es ms probable que esto se manifieste en las personas que tienen lecturas de presin arterial alta, tienen ascendencia africana o tienen sobrepeso.  Hable con el mdico sobre sus valores de presin arterial deseados.  Hgase controlar la presin arterial: ? Cada 3 a 5 aos si tiene entre 18 y 23 aos. ? Todos los aos si es mayor de [place].  Si tiene entre 49 y 47 aos y es fumador o Insurance account manager, pregntele al mdico si debe realizarse una prueba de deteccin de aneurisma artico abdominal (AAA) por nica vez. Diabetes Realcese exmenes de deteccin de la diabetes con regularidad. Este anlisis revisa el nivel de azcar en la sangre en [place]. Hgase las pruebas de deteccin:  Cada tresaos despus de los 68aos de edad si tiene un peso normal y un bajo riesgo de padecer diabetes.  Con ms frecuencia y a partir de Homer C Jones edad inferior si tiene sobrepeso o un alto riesgo de padecer diabetes. Qu debo saber sobre la prevencin de infecciones? Hepatitis B Si tiene un riesgo ms alto de contraer hepatitis B, debe someterse a un examen de deteccin de este virus. Hable con el mdico para averiguar si tiene riesgo de contraer la infeccin por hepatitis B. Hepatitis C Se recomienda un anlisis de [place] para:  [employer]  nacieron entre 1946 y 60.  Todas las personas que tengan un riesgo de haber contrado hepatitis C. Enfermedades de transmisin sexual (ETS)  Debe realizarse pruebas de deteccin de ITS todos los aos, incluidas la gonorrea y la clamidia, si: ? Es sexualmente activo y es menor de 24aos. ? Es mayor de 24aos, y Investment banker, operational informa que corre riesgo de tener este tipo de infecciones. ? La actividad sexual ha cambiado desde que le hicieron la ltima prueba de deteccin y tiene un riesgo mayor de Best boy clamidia o Radio broadcast assistant. Pregntele al mdico si usted tiene riesgo.   Pregntele al mdico si usted tiene un alto riesgo de Museum/gallery curator VIH. El mdico tambin puede recomendarle un medicamento recetado para ayudar a evitar la infeccin por el VIH. Si elige tomar medicamentos para prevenir el VIH, primero debe [employer] de deteccin del VIH. Luego debe hacerse anlisis cada 14meses mientras est tomando los medicamentos. Siga estas instrucciones en su casa: Estilo de vida  No consuma ningn producto que contenga nicotina o tabaco, como cigarrillos, cigarrillos electrnicos y tabaco de Higher education careers adviser. Si necesita ayuda para dejar de fumar, consulte al mdico.  No consuma drogas.  No comparta agujas.  Solicite ayuda a su mdico si necesita apoyo o informacin para abandonar las drogas. Consumo de alcohol  No beba alcohol si el mdico se lo prohbe.  Si bebe alcohol: ? Limite la cantidad que consume de 0 a 2 medidas por da. ? Est atento a la cantidad de alcohol que hay en las bebidas que toma. En los [place], una medida equivale a una botella de cerveza de 12oz (327ml), un vaso de vino de 5oz (119ml) o un vaso de una bebida alcohlica de alta graduacin de 1oz (56ml). Instrucciones generales  Realcese los estudios de rutina de la salud, dentales y de Public librarian.  [place].  Infrmele a su mdico si: ? Se siente deprimido con frecuencia. ? Alguna vez ha sido vctima de [place] o no se siente seguro en su casa. Resumen  Adoptar un estilo de vida saludable y recibir atencin preventiva son importantes para promover la salud y Musician.  Siga las instrucciones del mdico acerca de una dieta saludable, el ejercicio y la realizacin de pruebas o exmenes para Engineer, building services.  Siga las instrucciones del mdico con respecto al control del colesterol y la presin arterial. Esta informacin no tiene Marine scientist el consejo del mdico. Asegrese de hacerle al mdico cualquier pregunta que tenga. Document  Released: 07/17/2007 Document Revised: 02/08/2018 Document Reviewed: 02/08/2018 Elsevier Patient Education  2020 Elsevier Inc.      Agustina Caroli, MD [facility]

## 2019-01-04 LAB — LIPID PANEL
Chol/HDL Ratio: 4.6 ratio (ref 0.0–5.0)
Cholesterol, Total: 169 mg/dL (ref 100–199)
HDL: 37 mg/dL — ABNORMAL LOW (ref >39)
LDL Chol Calc (NIH): 80 mg/dL (ref 0–99)
Triglycerides: 318 mg/dL — ABNORMAL HIGH (ref 0–149)
VLDL Cholesterol Cal: 52 mg/dL — ABNORMAL HIGH (ref 5–40)

## 2019-01-04 LAB — COMPREHENSIVE METABOLIC PANEL
ALT: 47 IU/L — ABNORMAL HIGH (ref 0–44)
AST: 29 IU/L (ref 0–40)
Albumin/Globulin Ratio: 1.3 (ref 1.2–2.2)
Albumin: 4.2 g/dL (ref 3.8–4.9)
Alkaline Phosphatase: 62 IU/L (ref 39–117)
BUN/Creatinine Ratio: 13 (ref 9–20)
BUN: 18 mg/dL (ref 6–24)
Bilirubin Total: 0.3 mg/dL (ref 0.0–1.2)
CO2: 21 mmol/L (ref 20–29)
Calcium: 9.5 mg/dL (ref 8.7–10.2)
Chloride: 106 mmol/L (ref 96–106)
Creatinine, Ser: 1.37 mg/dL — ABNORMAL HIGH (ref 0.76–1.27)
GFR calc Af Amer: 65 mL/min/{1.73_m2} (ref >59)
GFR calc non Af Amer: 56 mL/min/{1.73_m2} — ABNORMAL LOW (ref >59)
Globulin, Total: 3.3 g/dL (ref 1.5–4.5)
Glucose: 84 mg/dL (ref 65–99)
Potassium: 4.5 mmol/L (ref 3.5–5.2)
Sodium: 142 mmol/L (ref 134–144)
Total Protein: 7.5 g/dL (ref 6.0–8.5)

## 2019-01-04 LAB — CBC WITH DIFFERENTIAL/PLATELET
Basophils Absolute: 0.1 10*3/uL (ref 0.0–0.2)
Basos: 1 %
EOS (ABSOLUTE): 0.1 10*3/uL (ref 0.0–0.4)
Eos: 1 %
Hematocrit: 44.8 % (ref 37.5–51.0)
Hemoglobin: 15.7 g/dL (ref 13.0–17.7)
Immature Grans (Abs): 0 10*3/uL (ref 0.0–0.1)
Immature Granulocytes: 0 %
Lymphocytes Absolute: 2.7 10*3/uL (ref 0.7–3.1)
Lymphs: 42 %
MCH: 34.2 pg — ABNORMAL HIGH (ref 26.6–33.0)
MCHC: 35 g/dL (ref 31.5–35.7)
MCV: 98 fL — ABNORMAL HIGH (ref 79–97)
Monocytes Absolute: 0.6 10*3/uL (ref 0.1–0.9)
Monocytes: 9 %
Neutrophils Absolute: 3 10*3/uL (ref 1.4–7.0)
Neutrophils: 47 %
Platelets: 266 10*3/uL (ref 150–450)
RBC: 4.59 x10E6/uL (ref 4.14–5.80)
RDW: 12.6 % (ref 11.6–15.4)
WBC: 6.3 10*3/uL (ref 3.4–10.8)

## 2019-01-04 LAB — HEMOGLOBIN A1C
Est. average glucose Bld gHb Est-mCnc: 123 mg/dL
Hgb A1c MFr Bld: 5.9 % — ABNORMAL HIGH (ref 4.8–5.6)

## 2019-01-04 LAB — PSA: Prostate Specific Ag, Serum: 0.4 ng/mL (ref 0.0–4.0)

## 2019-01-04 LAB — URIC ACID: Uric Acid: 7.6 mg/dL (ref 3.7–8.6)

## 2019-01-05 ENCOUNTER — Encounter: Payer: Self-pay | Admitting: Emergency Medicine

## 2019-03-12 ENCOUNTER — Other Ambulatory Visit: Payer: Self-pay | Admitting: Emergency Medicine

## 2019-03-12 DIAGNOSIS — N529 Male erectile dysfunction, unspecified: Secondary | ICD-10-CM

## 2019-03-12 NOTE — Telephone Encounter (Signed)
Requested Prescriptions  Pending Prescriptions Disp Refills  . sildenafil (VIAGRA) 100 MG tablet [Pharmacy Med Name: SILDENAFIL 100 MG TABLET] 6 tablet 19    Sig: TAKE 1/2-1 TABLET BY MOUTH DAILY AS NEEDED FOR ERECTILE DYSFUNCTION     Urology: Erectile Dysfunction Agents Passed - 03/12/2019  2:34 PM      Passed - Last BP in normal range    BP Readings from Last 1 Encounters:  01/03/19 120/68         Passed - Valid encounter within last 12 months    Recent Outpatient Visits          2 months ago Routine general medical examination at a health care facility   [facility] [place], Ines Bloomer, MD   3 months ago Acute gouty arthritis   [facility] at [facility], Ines Bloomer, MD   1 year ago Routine general medical examination at a health care facility   [facility] [place], Ines Bloomer, MD   1 year ago Routine general medical examination at a health care facility   [facility] [facility] [place], Ines Bloomer, MD   1 year ago Other chest pain   [facility], Ranell Patrick, MD

## 2019-03-29 ENCOUNTER — Ambulatory Visit: Payer: BC Managed Care – PPO | Attending: Internal Medicine

## 2019-03-29 ENCOUNTER — Other Ambulatory Visit: Payer: Self-pay

## 2019-03-29 DIAGNOSIS — Z23 Encounter for immunization: Secondary | ICD-10-CM | POA: Insufficient documentation

## 2019-03-29 NOTE — Progress Notes (Signed)
   Covid-19 Vaccination Clinic  Name:  Evan Page    MRN: [id number] DOB: December 06, 1960  03/29/2019  [name] was observed post Covid-19 immunization for 15 minutes without incidence. He was provided with Vaccine Information Sheet and instruction to access the V-Safe system.   [name] was instructed to call 911 with any severe reactions post vaccine: Marland Kitchen Difficulty breathing  . Swelling of your face and throat  . A fast heartbeat  . A bad rash all over your body  . Dizziness and weakness    Immunizations Administered    Name Date Dose VIS Date Route   Moderna COVID-19 Vaccine 03/29/2019  1:44 PM 0.5 mL 01/02/2019 Intramuscular   Manufacturer: Moderna   [id number]   [id number]

## 2019-05-01 ENCOUNTER — Ambulatory Visit: Payer: BC Managed Care – PPO | Attending: Family

## 2019-05-01 DIAGNOSIS — Z23 Encounter for immunization: Secondary | ICD-10-CM

## 2019-05-01 NOTE — Progress Notes (Signed)
   Covid-19 Vaccination Clinic  Name:  Evan Page    MRN: [id number] DOB: 11/05/1960  05/01/2019  [name] was observed post Covid-19 immunization for 15 minutes without incident. He was provided with Vaccine Information Sheet and instruction to access the V-Safe system.   [name] was instructed to call 911 with any severe reactions post vaccine: Marland Kitchen Difficulty breathing  . Swelling of face and throat  . A fast heartbeat  . A bad rash all over body  . Dizziness and weakness   Immunizations Administered    Name Date Dose VIS Date Route   Moderna COVID-19 Vaccine 05/01/2019  9:07 AM 0.5 mL 01/02/2019 Intramuscular   Manufacturer: Moderna   [id number]   [id number]

## 2019-06-11 ENCOUNTER — Other Ambulatory Visit: Payer: Self-pay

## 2019-06-11 ENCOUNTER — Encounter: Payer: Self-pay | Admitting: Emergency Medicine

## 2019-06-11 ENCOUNTER — Ambulatory Visit: Payer: BC Managed Care – PPO | Admitting: Emergency Medicine

## 2019-06-11 VITALS — BP 133/78 | HR 64 | Temp 98.0°F | Resp 16 | Ht 68.0 in | Wt 231.0 lb

## 2019-06-11 DIAGNOSIS — I480 Paroxysmal atrial fibrillation: Secondary | ICD-10-CM | POA: Diagnosis not present

## 2019-06-11 DIAGNOSIS — R1012 Left upper quadrant pain: Secondary | ICD-10-CM | POA: Diagnosis not present

## 2019-06-11 DIAGNOSIS — R7303 Prediabetes: Secondary | ICD-10-CM

## 2019-06-11 DIAGNOSIS — Z8739 Personal history of other diseases of the musculoskeletal system and connective tissue: Secondary | ICD-10-CM

## 2019-06-11 DIAGNOSIS — M2550 Pain in unspecified joint: Secondary | ICD-10-CM

## 2019-06-11 LAB — POCT URINALYSIS DIP (MANUAL ENTRY)
Bilirubin, UA: NEGATIVE
Blood, UA: NEGATIVE
Glucose, UA: NEGATIVE mg/dL
Ketones, POC UA: NEGATIVE mg/dL
Leukocytes, UA: NEGATIVE
Nitrite, UA: NEGATIVE
Protein Ur, POC: NEGATIVE mg/dL
Spec Grav, UA: 1.025 (ref 1.010–1.025)
Urobilinogen, UA: 0.2 E.U./dL
pH, UA: 5.5 (ref 5.0–8.0)

## 2019-06-11 NOTE — Progress Notes (Signed)
[facility] [name] 59 y.o.   No chief complaint on file.   HISTORY OF PRESENT ILLNESS: This is a 59 y.o. male complaining of pain to multiple joints but mostly left index finger for the past several weeks.  Denies injury.  Housekeeping physical work. Also complaining of left upper abdominal pain without associated symptoms for 1 week.  Able to eat and drink.  Denies nausea or vomiting.  Denies fever or chills.  Able to move bowels.  Denies rectal bleeding or melena.  Denies diarrhea.  No other significant symptoms No other complaints or medical concerns today. Has history of gout but no gout flareups. Fully vaccinated against Covid.  HPI   Prior to Admission medications   Medication Sig Start Date End Date Taking? Authorizing Provider  colchicine 0.6 MG tablet Take 1.2 mg now and 0.6 mg one hour later. Take 0.6 mg daily after that x 5 days. Patient not taking: Reported on 01/03/2019 11/13/18   Horald Pollen, MD  diltiazem (CARDIZEM CD) 120 MG 24 hr capsule TAKE 1 CAPSULE BY MOUTH EVERY DAY Patient not taking: Reported on 01/03/2019 12/27/17   Constance Haw, MD  predniSONE (DELTASONE) 50 MG tablet One daily with food Patient not taking: Reported on 01/03/2019 03/17/18   Robyn Haber, MD  sildenafil (VIAGRA) 100 MG tablet TAKE 1/2-1 TABLET BY MOUTH DAILY AS NEEDED FOR ERECTILE DYSFUNCTION 03/12/19   Horald Pollen, MD    No Known Allergies  Patient Active Problem List   Diagnosis Date Noted  . Obesity (BMI 30.0-34.9) 03/27/2018  . Obstructive sleep apnea on CPAP 11/01/2017  . Erectile dysfunction 11/01/2017  . Paroxysmal atrial fibrillation ([place]) 03/03/2017    Past Medical History:  Diagnosis Date  . Atrial fibrillation ([place])    a. 04/2017 s/p PVI.  Marland Kitchen Cataract    left   . GERD (gastroesophageal reflux disease)   . Sleep apnea    "waiting on my CPAP" (05/23/2017)    Past Surgical History:  Procedure Laterality Date  . ATRIAL FIBRILLATION  ABLATION N/A 05/06/2017   Procedure: ATRIAL FIBRILLATION ABLATION;  Surgeon: Constance Haw, MD;  Location: [place] [facility];  Service: Cardiovascular;  Laterality: N/A;  . CARDIOVERSION N/A 03/04/2017   Procedure: CARDIOVERSION;  Surgeon: Adrian Prows, MD;  Location: [place];  Service: Cardiovascular;  Laterality: N/A;  . LEFT HEART CATH AND CORONARY ANGIOGRAPHY N/A 05/24/2017   Procedure: LEFT HEART CATH AND CORONARY ANGIOGRAPHY possible angioplasty;  Surgeon: Nigel Mormon, MD;  Location: [place] [facility];  Service: Cardiovascular;  Laterality: N/A;  . WISDOM TOOTH EXTRACTION     3 removed - 1 remains    Social History   Socioeconomic History  . Marital status: Divorced    Spouse name: Not on file  . Number of children: 2  . Years of education: Not on file  . Highest education level: Not on file  Occupational History  . Not on file  Tobacco Use  . Smoking status: Former Smoker    Packs/day: 1.00    Years: 20.00    Pack years: 20.00    Types: Cigarettes    Quit date: 1998    Years since quitting: 23.3  . Smokeless tobacco: Never Used  Substance and Sexual Activity  . Alcohol use: Yes    Alcohol/week: 3.0 standard drinks    Types: 3 Glasses of wine per week  . Drug use: No  . Sexual activity: Yes  Other Topics Concern  . Not on  file  Social History Narrative   ** Merged History Encounter **       Social Determinants of Health   Financial Resource Strain:   . Difficulty of Paying Living Expenses:   Food Insecurity:   . Worried About Charity fundraiser in the Last Year:   . Arboriculturist in the Last Year:   Transportation Needs:   . Film/video editor (Medical):   Marland Kitchen Lack of Transportation (Non-Medical):   Physical Activity:   . Days of Exercise per Week:   . Minutes of Exercise per Session:   Stress:   . Feeling of Stress :   Social Connections:   . Frequency of Communication with Friends and Family:   . Frequency of Social Gatherings  with Friends and Family:   . Attends Religious Services:   . Active Member of Clubs or Organizations:   . Attends Archivist Meetings:   Marland Kitchen Marital Status:   Intimate Partner Violence:   . Fear of Current or Ex-Partner:   . Emotionally Abused:   Marland Kitchen Physically Abused:   . Sexually Abused:     Family History  Problem Relation Age of Onset  . Diabetes Mother   . Hyperlipidemia Mother   . Diabetes Father   . Hyperlipidemia Father   . Esophageal cancer Father   . Drug abuse Sister   . Hyperlipidemia Maternal Grandmother   . Colon cancer Neg Hx   . Pancreatic cancer Neg Hx   . Prostate cancer Neg Hx   . Rectal cancer Neg Hx   . Stomach cancer Neg Hx      Review of Systems  Constitutional: Negative.  Negative for chills and fever.  HENT: Negative.  Negative for congestion and sore throat.   Respiratory: Negative.  Negative for cough and shortness of breath.   Cardiovascular: Negative.  Negative for chest pain and palpitations.  Gastrointestinal: Positive for abdominal pain. Negative for blood in stool, constipation, diarrhea, melena, nausea and vomiting.  Genitourinary: Negative.  Negative for dysuria, flank pain, hematuria and urgency.  Musculoskeletal: Positive for joint pain.  Skin: Negative.  Negative for rash.  Neurological: Negative.  Negative for dizziness and headaches.  All other systems reviewed and are negative.  Vitals:   06/11/19 1734  BP: 133/78  Pulse: 64  Resp: 16  Temp: 98 F (36.7 C)  SpO2: 97%     Physical Exam Vitals reviewed.  Constitutional:      Appearance: Normal appearance.  HENT:     Head: Normocephalic.  Eyes:     Extraocular Movements: Extraocular movements intact.     Conjunctiva/sclera: Conjunctivae normal.     Pupils: Pupils are equal, round, and reactive to light.  Cardiovascular:     Rate and Rhythm: Normal rate and regular rhythm.     Pulses: Normal pulses.     Heart sounds: Normal heart sounds.  Pulmonary:      Breath sounds: Normal breath sounds.  Abdominal:     General: Bowel sounds are normal. There is no distension.     Palpations: Abdomen is soft. There is no mass.     Tenderness: There is abdominal tenderness (Left upper quadrant). There is no right CVA tenderness, left CVA tenderness, guarding or rebound.  Musculoskeletal:        General: Normal range of motion.     Cervical back: Normal range of motion and neck supple.  Skin:    General: Skin is warm and dry.  Capillary Refill: Capillary refill takes less than 2 seconds.  Neurological:     General: No focal deficit present.     Mental Status: He is alert and oriented to person, place, and time.  Psychiatric:        Mood and Affect: Mood normal.        Behavior: Behavior normal.    A total of 30 minutes was spent with the patient, greater than 50% of which was in counseling/coordination of care regarding differential diagnosis of his symptoms, need for diagnostic work-up including blood work and diagnostic imaging, diet and nutrition, review of most recent office visit notes, review of most recent blood work results, prognosis and need for follow-up.   ASSESSMENT & PLAN: Diagnoses and all orders for this visit:  Arthralgia of multiple joints -     ANA,IFA RA Diag Pnl w/rflx Tit/Patn -     Sedimentation Rate  Paroxysmal atrial fibrillation (HCC)  Prediabetes -     Hemoglobin A1c  History of gout  Abdominal pain, left upper quadrant -     CBC with Differential/Platelet -     Comprehensive metabolic panel -     POCT urinalysis dipstick -     US Abdomen Complete; Future    Patient Instructions  Dolor abdominal en los adultos Abdominal Pain, Adult El dolor de estmago (abdominal) puede tener muchas causas. [place] veces, el dolor de [place] no es peligroso. Muchos de [employer] de dolor de estmago pueden controlarse y tratarse en casa. Sin embargo, a Clinical cytogeneticist, Conservation officer, historic buildings de [place] es grave. El mdico intentar  descubrir la causa del dolor de [place]. Siga estas instrucciones en su casa:  Medicamentos  [employer] de venta libre y los recetados solamente como se lo haya indicado el mdico.  No tome medicamentos que lo ayuden a Landscape architect (laxantes), salvo que el mdico se lo indique. Instrucciones generales  Est atento al dolor de estmago para Actuary cambio.  Beba suficiente lquido para Contractor pis (la orina) de color amarillo plido.  Concurra a todas las visitas de seguimiento como se lo haya indicado el mdico. Esto es importante. Comunquese con un mdico si:  El dolor de estmago cambia o [place].  No tiene apetito o baja de peso sin proponrselo.  Tiene dificultades para defecar (est estreido) o heces lquidas (diarrea) durante ms de 2 o 3das.  Siente dolor al orinar o defecar.  El dolor de estmago lo despierta de noche.  El dolor empeora con las comidas, despus de comer o con determinados alimentos.  Tiene vmitos y no puede retener nada de lo que ingiere.  Tiene fiebre.  Observa sangre en la orina. Solicite ayuda de inmediato si:  El dolor no desaparece en el tiempo indicado por el mdico.  No puede dejar de vomitar.  Siente dolor solamente en zonas especficas del abdomen, como el lado derecho o la parte inferior izquierda.  Tiene heces con sangre, de color negro o con aspecto alquitranado.  Tiene dolor muy intenso en el vientre, clicos o meteorismo.  Presenta signos de no tener suficientes lquidos o agua en el cuerpo (deshidratacin), por ejemplo: ? Elmon Else, muy escasa o falta de orina. ? Labios agrietados. ? Sequedad de boca. ? Ojos hundidos. ? Somnolencia. ? Debilidad.  Tiene dificultad para respirar o Tourist information centre manager. Resumen  Muchos de [employer] de dolor de estmago pueden controlarse y tratarse en casa.  Est atento al dolor de estmago para Hydrographic surveyor  cualquier cambio.  Tome los medicamentos de venta  libre y los recetados solamente como se lo haya indicado el mdico.  Comunquese con un mdico si el dolor de estmago cambia o [place].  Busque ayuda de inmediato si tiene dolor muy intenso en el vientre, clicos o meteorismo. Esta informacin no tiene Marine scientist el consejo del mdico. Asegrese de hacerle al mdico cualquier pregunta que tenga. Document Revised: 07/26/2018 Document Reviewed: 07/26/2018 Elsevier Patient Education  2020 Elsevier Inc.      Agustina Caroli, MD [facility]

## 2019-06-11 NOTE — Patient Instructions (Addendum)
Dolor abdominal en los adultos Abdominal Pain, Adult El dolor de [place] (abdominal) puede tener muchas causas. [place] veces, el dolor de [place] no es peligroso. Muchos de [employer] de dolor de estmago pueden controlarse y tratarse en casa. Sin embargo, a Clinical cytogeneticist, Conservation officer, historic buildings de [place] es grave. El mdico intentar descubrir la causa del dolor de [place]. Siga estas instrucciones en su casa:  Medicamentos  [employer] de venta libre y los recetados solamente como se lo haya indicado el mdico.  No tome medicamentos que lo ayuden a Landscape architect (laxantes), salvo que el mdico se lo indique. Instrucciones generales  Est atento al dolor de estmago para Actuary cambio.  Beba suficiente lquido para Contractor pis (la orina) de color amarillo plido.  Concurra a todas las visitas de seguimiento como se lo haya indicado el mdico. Esto es importante. Comunquese con un mdico si:  El dolor de estmago cambia o [place].  No tiene apetito o baja de peso sin proponrselo.  Tiene dificultades para defecar (est estreido) o heces lquidas (diarrea) durante ms de 2 o 3das.  Siente dolor al orinar o defecar.  El dolor de estmago lo despierta de noche.  El dolor empeora con las comidas, despus de comer o con determinados alimentos.  Tiene vmitos y no puede retener nada de lo que ingiere.  Tiene fiebre.  Observa sangre en la orina. Solicite ayuda de inmediato si:  El dolor no desaparece en el tiempo indicado por el mdico.  No puede dejar de vomitar.  Siente dolor solamente en zonas especficas del abdomen, como el lado derecho o la parte inferior izquierda.  Tiene heces con sangre, de color negro o con aspecto alquitranado.  Tiene dolor muy intenso en el vientre, clicos o meteorismo.  Presenta signos de no tener suficientes lquidos o agua en el cuerpo (deshidratacin), por ejemplo: ? Elmon Else, muy escasa o falta de orina. ? Labios  agrietados. ? Sequedad de boca. ? Ojos hundidos. ? Somnolencia. ? Debilidad.  Tiene dificultad para respirar o Tourist information centre manager. Resumen  Muchos de [employer] de dolor de estmago pueden controlarse y tratarse en casa.  Est atento al dolor de estmago para Actuary cambio.  Tome los medicamentos de venta libre y los recetados solamente como se lo haya indicado el mdico.  Comunquese con un mdico si el dolor de estmago cambia o [place].  Busque ayuda de inmediato si tiene dolor muy intenso en el vientre, clicos o meteorismo. Esta informacin no tiene Marine scientist el consejo del mdico. Asegrese de hacerle al mdico cualquier pregunta que tenga. Document Revised: 07/26/2018 Document Reviewed: 07/26/2018 Elsevier Patient Education  [place].

## 2019-06-12 ENCOUNTER — Telehealth: Payer: Self-pay | Admitting: Emergency Medicine

## 2019-06-12 LAB — COMPREHENSIVE METABOLIC PANEL
ALT: 40 IU/L (ref 0–44)
AST: 31 IU/L (ref 0–40)
Albumin/Globulin Ratio: 1.3 (ref 1.2–2.2)
Albumin: 4.4 g/dL (ref 3.8–4.9)
Alkaline Phosphatase: 61 IU/L (ref 39–117)
BUN/Creatinine Ratio: 15 (ref 9–20)
BUN: 21 mg/dL (ref 6–24)
Bilirubin Total: 0.5 mg/dL (ref 0.0–1.2)
CO2: 24 mmol/L (ref 20–29)
Calcium: 9.7 mg/dL (ref 8.7–10.2)
Chloride: 108 mmol/L — ABNORMAL HIGH (ref 96–106)
Creatinine, Ser: 1.37 mg/dL — ABNORMAL HIGH (ref 0.76–1.27)
GFR calc Af Amer: 65 mL/min/{1.73_m2} (ref >59)
GFR calc non Af Amer: 56 mL/min/{1.73_m2} — ABNORMAL LOW (ref >59)
Globulin, Total: 3.3 g/dL (ref 1.5–4.5)
Glucose: 90 mg/dL (ref 65–99)
Potassium: 4.5 mmol/L (ref 3.5–5.2)
Sodium: 144 mmol/L (ref 134–144)
Total Protein: 7.7 g/dL (ref 6.0–8.5)

## 2019-06-12 LAB — ANA,IFA RA DIAG PNL W/RFLX TIT/PATN
ANA Titer 1: NEGATIVE
Cyclic Citrullin Peptide Ab: 7 units (ref 0–19)
Rheumatoid fact SerPl-aCnc: <10 IU/mL (ref 0.0–13.9)

## 2019-06-12 LAB — CBC WITH DIFFERENTIAL/PLATELET
Basophils Absolute: 0.1 10*3/uL (ref 0.0–0.2)
Basos: 1 %
EOS (ABSOLUTE): 0 10*3/uL (ref 0.0–0.4)
Eos: 1 %
Hematocrit: 43.8 % (ref 37.5–51.0)
Hemoglobin: 15 g/dL (ref 13.0–17.7)
Immature Grans (Abs): 0 10*3/uL (ref 0.0–0.1)
Immature Granulocytes: 0 %
Lymphocytes Absolute: 2.4 10*3/uL (ref 0.7–3.1)
Lymphs: 38 %
MCH: 34.5 pg — ABNORMAL HIGH (ref 26.6–33.0)
MCHC: 34.2 g/dL (ref 31.5–35.7)
MCV: 101 fL — ABNORMAL HIGH (ref 79–97)
Monocytes Absolute: 0.6 10*3/uL (ref 0.1–0.9)
Monocytes: 9 %
Neutrophils Absolute: 3.2 10*3/uL (ref 1.4–7.0)
Neutrophils: 51 %
Platelets: 260 10*3/uL (ref 150–450)
RBC: 4.35 x10E6/uL (ref 4.14–5.80)
RDW: 12.6 % (ref 11.6–15.4)
WBC: 6.3 10*3/uL (ref 3.4–10.8)

## 2019-06-12 LAB — SEDIMENTATION RATE: Sed Rate: 31 mm/hr — ABNORMAL HIGH (ref 0–30)

## 2019-06-12 LAB — HEMOGLOBIN A1C
Est. average glucose Bld gHb Est-mCnc: 123 mg/dL
Hgb A1c MFr Bld: 5.9 % — ABNORMAL HIGH (ref 4.8–5.6)

## 2019-06-12 NOTE — Telephone Encounter (Signed)
Blood results discussed with patient. 

## 2019-08-09 ENCOUNTER — Other Ambulatory Visit: Payer: Self-pay

## 2019-08-09 ENCOUNTER — Ambulatory Visit: Payer: BC Managed Care – PPO | Admitting: Emergency Medicine

## 2019-08-09 ENCOUNTER — Encounter: Payer: Self-pay | Admitting: Emergency Medicine

## 2019-08-09 VITALS — BP 118/54 | HR 66 | Temp 97.7°F | Resp 16 | Ht 68.0 in | Wt 228.0 lb

## 2019-08-09 DIAGNOSIS — R196 Halitosis: Secondary | ICD-10-CM

## 2019-08-09 DIAGNOSIS — S76211A Strain of adductor muscle, fascia and tendon of right thigh, initial encounter: Secondary | ICD-10-CM | POA: Diagnosis not present

## 2019-08-09 DIAGNOSIS — R7303 Prediabetes: Secondary | ICD-10-CM

## 2019-08-09 DIAGNOSIS — K5792 Diverticulitis of intestine, part unspecified, without perforation or abscess without bleeding: Secondary | ICD-10-CM | POA: Diagnosis not present

## 2019-08-09 DIAGNOSIS — R1032 Left lower quadrant pain: Secondary | ICD-10-CM

## 2019-08-09 MED ORDER — AMOXICILLIN-POT CLAVULANATE 875-125 MG PO TABS: 1.0000 | ORAL_TABLET | Freq: Two times a day (BID) | ORAL | 0 refills | Status: AC

## 2019-08-09 NOTE — Patient Instructions (Addendum)
     If you have lab work done today you will be contacted with your lab results within the next 2 weeks.  If you have not heard from [place] then please contact us. The fastest way to get your results is to register for My Chart.   IF you received an x-ray today, you will receive an invoice from [facility]. Please contact [facility] at [phone] with questions or concerns regarding your invoice.   IF you received labwork today, you will receive an invoice from [place]. Please contact [facility] at [phone] with questions or concerns regarding your invoice.   Our billing staff will not be able to assist you with questions regarding bills from these companies.  You will be contacted with the lab results as soon as they are available. The fastest way to get your results is to activate your My Chart account. Instructions are located on the last page of this paperwork. If you have not heard from [place] regarding the results in 2 weeks, please contact this office.     Diverticulitis Diverticulitis  La diverticulitis ocurre cuando pequeos bolsillos que se han formado en el intestino grueso (colon) se infectan o se inflaman. Esto produce dolor de Paramedic y heces lquidas (diarrea). Estas bolsas en el colon se denominan divertculos. Se forman en las personas que tienen una afeccin llamada diverticulitis. Siga estas indicaciones en su casa: Medicamentos  [employer] de venta libre y los recetados solamente como se lo haya indicado el mdico. Estos incluyen los siguientes: ? Antibiticos. ? Analgsicos. ? Pastillas de [place]. ? Probiticos. ? Laxantes.  No conduzca ni use maquinaria pesada mientras toma analgsicos recetados.  Si le recetaron un antibitico, tmelo como se lo hayan indicado. No deje de tomarlos aunque se sienta mejor. Instrucciones generales   Siga la dieta como se lo haya indicado el mdico.  Cuando se sienta mejor, el mdico puede  indicarle que cambie la dieta. Tal vez necesite ingerir gran cantidad de fibra. La fibra facilita la evacuacin intestinal (defecacin). Entre los alimentos saludables con [place], se incluyen los siguientes: ? Frutos rojos. ? Frijoles. ? Lentejas. ? Verduras de [employer].  Haga ejercicios 3 o ms veces por semana. Hgalos durante 30 minutos cada vez. Ejerctese lo suficiente como para transpirar y Conservation officer, historic buildings los latidos cardacos.  Concurra a todas las visitas de control como se lo hayan indicado. Esto es importante. Puede que tenga que someterse a un examen del intestino grueso. Esto se denomina colonoscopia. Comunquese con un mdico si:  El dolor no mejora.  Le cuesta mucho comer o beber.  No defeca como lo hace normalmente. Solicite ayuda de inmediato si:  [place] representative.  Los problemas no mejoran.  Los problemas empeoran muy rpidamente.  Tiene fiebre.  Devuelve (vomita) ms de una vez.  Sus heces tienen las siguientes caractersticas: ? Teacher, English as a foreign language. ? Son de color negro. ? Son alquitranadas. Resumen  La diverticulitis ocurre cuando pequeos bolsillos que se han formado en el intestino grueso (colon) se infectan o se inflaman.  Tome los medicamentos solamente como se lo haya indicado el mdico.  Siga la dieta como se lo haya indicado el mdico. Esta informacin no tiene Marine scientist el consejo del mdico. Asegrese de hacerle al mdico cualquier pregunta que tenga. Document Revised: 07/22/2016 Document Reviewed: 07/22/2016 Elsevier Patient Education  [place].

## 2019-08-09 NOTE — Progress Notes (Signed)
[facility] Evan Page 59 y.o.   Chief Complaint  Patient presents with  . Flank Pain    for 2 weeks on LEFT side  . Groin Injury    on the RIGHT for 2 weeks   . mouth problem    for 2 weeks per pt mouth smells bad    HISTORY OF PRESENT ILLNESS: This is a 59 y.o. male complaining of 3 things: 1.  Left mid to lower abdominal pain for the past 2 to 3 weeks.  Has turned into a steady pain without radiation. Colonoscopy done 3 years ago showed large diverticuli on the left descending colon.  Denies fever or chills.  Denies nausea or vomiting.  Able to eat and drink.  Denies constipation or diarrhea.  Denies rectal bleeding or melena. 2.  Pain to right groin area for the past week worse with movement and bending. 3.  Bad breath for the past 2 weeks. Has history of prediabetes and mild renal insufficiency. Also has a history of sleep apnea and paroxysmal atrial fibrillation.  Doing well. No other complaints or medical concerns today. Fully vaccinated against Covid.  HPI   Prior to Admission medications   Medication Sig Start Date End Date Taking? Authorizing Provider  sildenafil (VIAGRA) 100 MG tablet TAKE 1/2-1 TABLET BY MOUTH DAILY AS NEEDED FOR ERECTILE DYSFUNCTION 03/12/19  Yes Nare Gaspari, Ines Bloomer, MD  colchicine 0.6 MG tablet Take 1.2 mg now and 0.6 mg one hour later. Take 0.6 mg daily after that x 5 days. Patient not taking: Reported on 08/09/2019 11/13/18   Horald Pollen, MD  diltiazem (CARDIZEM CD) 120 MG 24 hr capsule TAKE 1 CAPSULE BY MOUTH EVERY DAY Patient not taking: Reported on 08/09/2019 12/27/17   Constance Haw, MD  predniSONE (DELTASONE) 50 MG tablet One daily with food Patient not taking: Reported on 08/09/2019 03/17/18   Robyn Haber, MD    No Known Allergies  Patient Active Problem List   Diagnosis Date Noted  . Obesity (BMI 30.0-34.9) 03/27/2018  . Obstructive sleep apnea on CPAP 11/01/2017  . Erectile dysfunction 11/01/2017  . Paroxysmal  atrial fibrillation ([place]) 03/03/2017    Past Medical History:  Diagnosis Date  . Atrial fibrillation ([place])    a. 04/2017 s/p PVI.  Marland Kitchen Cataract    left   . GERD (gastroesophageal reflux disease)   . Sleep apnea    "waiting on my CPAP" (05/23/2017)    Past Surgical History:  Procedure Laterality Date  . ATRIAL FIBRILLATION ABLATION N/A 05/06/2017   Procedure: ATRIAL FIBRILLATION ABLATION;  Surgeon: Constance Haw, MD;  Location: [place] [facility];  Service: Cardiovascular;  Laterality: N/A;  . CARDIOVERSION N/A 03/04/2017   Procedure: CARDIOVERSION;  Surgeon: Adrian Prows, MD;  Location: [place];  Service: Cardiovascular;  Laterality: N/A;  . LEFT HEART CATH AND CORONARY ANGIOGRAPHY N/A 05/24/2017   Procedure: LEFT HEART CATH AND CORONARY ANGIOGRAPHY possible angioplasty;  Surgeon: Nigel Mormon, MD;  Location: [place] [facility];  Service: Cardiovascular;  Laterality: N/A;  . WISDOM TOOTH EXTRACTION     3 removed - 1 remains    Social History   Socioeconomic History  . Marital status: Divorced    Spouse name: Not on file  . Number of children: 2  . Years of education: Not on file  . Highest education level: Not on file  Occupational History  . Not on file  Tobacco Use  . Smoking status: Former Smoker    Packs/day: 1.00  Years: 20.00    Pack years: 20.00    Types: Cigarettes    Quit date: 35    Years since quitting: 23.5  . Smokeless tobacco: Never Used  Vaping Use  . Vaping Use: Never used  Substance and Sexual Activity  . Alcohol use: Yes    Alcohol/week: 3.0 standard drinks    Types: 3 Glasses of wine per week  . Drug use: No  . Sexual activity: Yes  Other Topics Concern  . Not on file  Social History Narrative   ** Merged History Encounter **       Social Determinants of Health   Financial Resource Strain:   . Difficulty of Paying Living Expenses:   Food Insecurity:   . Worried About Charity fundraiser in the Last Year:   . Academic librarian in the Last Year:   Transportation Needs:   . Film/video editor (Medical):   Marland Kitchen Lack of Transportation (Non-Medical):   Physical Activity:   . Days of Exercise per Week:   . Minutes of Exercise per Session:   Stress:   . Feeling of Stress :   Social Connections:   . Frequency of Communication with Friends and Family:   . Frequency of Social Gatherings with Friends and Family:   . Attends Religious Services:   . Active Member of Clubs or Organizations:   . Attends Archivist Meetings:   Marland Kitchen Marital Status:   Intimate Partner Violence:   . Fear of Current or Ex-Partner:   . Emotionally Abused:   Marland Kitchen Physically Abused:   . Sexually Abused:     Family History  Problem Relation Age of Onset  . Diabetes Mother   . Hyperlipidemia Mother   . Diabetes Father   . Hyperlipidemia Father   . Esophageal cancer Father   . Drug abuse Sister   . Hyperlipidemia Maternal Grandmother   . Colon cancer Neg Hx   . Pancreatic cancer Neg Hx   . Prostate cancer Neg Hx   . Rectal cancer Neg Hx   . Stomach cancer Neg Hx      Review of Systems  Constitutional: Negative.  Negative for chills and fever.  HENT: Negative.  Negative for congestion and sore throat.        Bad breath  Respiratory: Negative.  Negative for cough and shortness of breath.   Cardiovascular: Negative.  Negative for chest pain and palpitations.  Gastrointestinal: Positive for abdominal pain. Negative for blood in stool, diarrhea, melena, nausea and vomiting.  Genitourinary: Negative.  Negative for dysuria and hematuria.  Skin: Negative.  Negative for rash.  Neurological: Negative.  Negative for dizziness and headaches.  All other systems reviewed and are negative.  Today's Vitals   08/09/19 1428  BP: (!) 118/54  Pulse: 66  Resp: 16  Temp: 97.7 F (36.5 C)  TempSrc: Temporal  SpO2: 97%  Weight: 228 lb (103.4 kg)  Height: 5\' 8"  (1.727 m)   Body mass index is 34.67 kg/m.   Physical Exam Vitals  reviewed.  Constitutional:      Appearance: Normal appearance.  HENT:     Head: Normocephalic.     Mouth/Throat:     Mouth: Mucous membranes are moist.     Pharynx: Oropharynx is clear.  Eyes:     Extraocular Movements: Extraocular movements intact.     Conjunctiva/sclera: Conjunctivae normal.     Pupils: Pupils are equal, round, and reactive to light.  Cardiovascular:  Rate and Rhythm: Normal rate and regular rhythm.     Pulses: Normal pulses.     Heart sounds: Normal heart sounds.  Pulmonary:     Effort: Pulmonary effort is normal.     Breath sounds: Normal breath sounds.  Abdominal:     General: Bowel sounds are normal.     Palpations: Abdomen is soft.     Tenderness: There is abdominal tenderness in the left upper quadrant and left lower quadrant. There is no right CVA tenderness, left CVA tenderness, guarding or rebound.     Hernia: No hernia is present.  Genitourinary:    Comments: Some tenderness to right inguinal ligament area Musculoskeletal:        General: Normal range of motion.     Cervical back: Normal range of motion and neck supple.  Skin:    General: Skin is warm and dry.     Capillary Refill: Capillary refill takes less than 2 seconds.  Neurological:     General: No focal deficit present.     Mental Status: He is alert and oriented to person, place, and time.  Psychiatric:        Mood and Affect: Mood normal.        Behavior: Behavior normal.    A total of 30 minutes was spent with the patient, greater than 50% of which was in counseling/coordination of care regarding differential diagnosis of left-sided abdominal pain including but not limited to acute diverticulitis, treatment, need for antibiotic and diet modification, review of most recent blood work results, review of recent colonoscopy results, review of most recent office visit notes, need for diagnostic CT scan of abdomen and pelvis, prognosis and need to follow-up in 2 weeks but earlier if no  better or worse.   ASSESSMENT & PLAN: Yates was seen today for flank pain, groin injury and mouth problem.  Diagnoses and all orders for this visit:  Acute diverticulitis -     CBC with Differential/Platelet -     Comprehensive metabolic panel -     amoxicillin-clavulanate (AUGMENTIN) 875-125 MG tablet; Take 1 tablet by mouth 2 (two) times daily for 7 days.  Inguinal strain, right, initial encounter  Bad breath  Prediabetes -     Hemoglobin A1c  Left lower quadrant abdominal pain -     CT ABDOMEN PELVIS W WO CONTRAST; Future    Patient Instructions       If you have lab work done today you will be contacted with your lab results within the next 2 weeks.  If you have not heard from [place] then please contact us. The fastest way to get your results is to register for My Chart.   IF you received an x-ray today, you will receive an invoice from [facility] Radiology. Please contact [facility] Radiology at [phone] with questions or concerns regarding your invoice.   IF you received labwork today, you will receive an invoice from [place]. Please contact [facility] at [phone] with questions or concerns regarding your invoice.   Our billing staff will not be able to assist you with questions regarding bills from these companies.  You will be contacted with the lab results as soon as they are available. The fastest way to get your results is to activate your My Chart account. Instructions are located on the last page of this paperwork. If you have not heard from [place] regarding the results in 2 weeks, please contact this office.     Diverticulitis Diverticulitis  La  diverticulitis ocurre cuando pequeos bolsillos que se han formado en el intestino grueso (colon) se infectan o se inflaman. Esto produce dolor de Paramedic y heces lquidas (diarrea). Estas bolsas en el colon se denominan divertculos. Se forman en las personas que tienen una afeccin llamada diverticulitis. Siga  estas indicaciones en su casa: Medicamentos  [employer] de venta libre y los recetados solamente como se lo haya indicado el mdico. Estos incluyen los siguientes: ? Antibiticos. ? Analgsicos. ? Pastillas de [place]. ? Probiticos. ? Laxantes.  No conduzca ni use maquinaria pesada mientras toma analgsicos recetados.  Si le recetaron un antibitico, tmelo como se lo hayan indicado. No deje de tomarlos aunque se sienta mejor. Instrucciones generales   Siga la dieta como se lo haya indicado el mdico.  Cuando se sienta mejor, el mdico puede indicarle que cambie la dieta. Tal vez necesite ingerir gran cantidad de fibra. La fibra facilita la evacuacin intestinal (defecacin). Entre los alimentos saludables con [place], se incluyen los siguientes: ? Frutos rojos. ? Frijoles. ? Lentejas. ? Verduras de [employer].  Haga ejercicios 3 o ms veces por semana. Hgalos durante 30 minutos cada vez. Ejerctese lo suficiente como para transpirar y Conservation officer, historic buildings los latidos cardacos.  Concurra a todas las visitas de control como se lo hayan indicado. Esto es importante. Puede que tenga que someterse a un examen del intestino grueso. Esto se denomina colonoscopia. Comunquese con un mdico si:  El dolor no mejora.  Le cuesta mucho comer o beber.  No defeca como lo hace normalmente. Solicite ayuda de inmediato si:  [place] representative.  Los problemas no mejoran.  Los problemas empeoran muy rpidamente.  Tiene fiebre.  Devuelve (vomita) ms de una vez.  Sus heces tienen las siguientes caractersticas: ? Teacher, English as a foreign language. ? Son de color negro. ? Son alquitranadas. Resumen  La diverticulitis ocurre cuando pequeos bolsillos que se han formado en el intestino grueso (colon) se infectan o se inflaman.  Tome los medicamentos solamente como se lo haya indicado el mdico.  Siga la dieta como se lo haya indicado el mdico. Esta informacin no tiene Marine scientist el consejo  del mdico. Asegrese de hacerle al mdico cualquier pregunta que tenga. Document Revised: 07/22/2016 Document Reviewed: 07/22/2016 Elsevier Patient Education  2020 Elsevier Inc.      Agustina Caroli, MD [facility]

## 2019-08-10 LAB — CBC WITH DIFFERENTIAL/PLATELET
Basophils Absolute: 0.1 10*3/uL (ref 0.0–0.2)
Basos: 1 %
EOS (ABSOLUTE): 0.1 10*3/uL (ref 0.0–0.4)
Eos: 1 %
Hematocrit: 44.8 % (ref 37.5–51.0)
Hemoglobin: 15.3 g/dL (ref 13.0–17.7)
Immature Grans (Abs): 0 10*3/uL (ref 0.0–0.1)
Immature Granulocytes: 0 %
Lymphocytes Absolute: 2.7 10*3/uL (ref 0.7–3.1)
Lymphs: 39 %
MCH: 34.2 pg — ABNORMAL HIGH (ref 26.6–33.0)
MCHC: 34.2 g/dL (ref 31.5–35.7)
MCV: 100 fL — ABNORMAL HIGH (ref 79–97)
Monocytes Absolute: 0.6 10*3/uL (ref 0.1–0.9)
Monocytes: 9 %
Neutrophils Absolute: 3.5 10*3/uL (ref 1.4–7.0)
Neutrophils: 50 %
Platelets: 260 10*3/uL (ref 150–450)
RBC: 4.48 x10E6/uL (ref 4.14–5.80)
RDW: 12.4 % (ref 11.6–15.4)
WBC: 6.9 10*3/uL (ref 3.4–10.8)

## 2019-08-10 LAB — COMPREHENSIVE METABOLIC PANEL
ALT: 50 IU/L — ABNORMAL HIGH (ref 0–44)
AST: 34 IU/L (ref 0–40)
Albumin/Globulin Ratio: 1.4 (ref 1.2–2.2)
Albumin: 4.4 g/dL (ref 3.8–4.9)
Alkaline Phosphatase: 64 IU/L (ref 48–121)
BUN/Creatinine Ratio: 14 (ref 10–24)
BUN: 18 mg/dL (ref 8–27)
Bilirubin Total: 0.4 mg/dL (ref 0.0–1.2)
CO2: 23 mmol/L (ref 20–29)
Calcium: 9.2 mg/dL (ref 8.6–10.2)
Chloride: 106 mmol/L (ref 96–106)
Creatinine, Ser: 1.32 mg/dL — ABNORMAL HIGH (ref 0.76–1.27)
GFR calc Af Amer: 67 mL/min/{1.73_m2} (ref >59)
GFR calc non Af Amer: 58 mL/min/{1.73_m2} — ABNORMAL LOW (ref >59)
Globulin, Total: 3.1 g/dL (ref 1.5–4.5)
Glucose: 72 mg/dL (ref 65–99)
Potassium: 4.1 mmol/L (ref 3.5–5.2)
Sodium: 143 mmol/L (ref 134–144)
Total Protein: 7.5 g/dL (ref 6.0–8.5)

## 2019-08-10 LAB — HEMOGLOBIN A1C
Est. average glucose Bld gHb Est-mCnc: 126 mg/dL
Hgb A1c MFr Bld: 6 % — ABNORMAL HIGH (ref 4.8–5.6)

## 2019-08-23 ENCOUNTER — Ambulatory Visit (HOSPITAL_BASED_OUTPATIENT_CLINIC_OR_DEPARTMENT_OTHER)
Admission: RE | Admit: 2019-08-23 | Discharge: 2019-08-23 | Disposition: A | Discharge status: Home / Self Care | Payer: BC Managed Care – PPO | Source: Ambulatory Visit | Attending: Emergency Medicine | Admitting: Emergency Medicine

## 2019-08-23 ENCOUNTER — Other Ambulatory Visit: Payer: Self-pay

## 2019-08-23 ENCOUNTER — Encounter: Payer: Self-pay | Admitting: Emergency Medicine

## 2019-08-23 ENCOUNTER — Ambulatory Visit: Payer: BC Managed Care – PPO | Admitting: Emergency Medicine

## 2019-08-23 VITALS — BP 125/64 | HR 71 | Temp 98.0°F | Ht 68.0 in | Wt 231.4 lb

## 2019-08-23 DIAGNOSIS — N529 Male erectile dysfunction, unspecified: Secondary | ICD-10-CM | POA: Diagnosis not present

## 2019-08-23 DIAGNOSIS — R1030 Lower abdominal pain, unspecified: Secondary | ICD-10-CM | POA: Diagnosis not present

## 2019-08-23 DIAGNOSIS — R1032 Left lower quadrant pain: Secondary | ICD-10-CM | POA: Insufficient documentation

## 2019-08-23 MED ORDER — SILDENAFIL CITRATE 100 MG PO TABS: 100.0000 mg | ORAL_TABLET | ORAL | 19 refills | Status: DC | PRN

## 2019-08-23 MED ORDER — IOHEXOL 300 MG/ML  SOLN
100.0000 mL | Freq: Once | INTRAMUSCULAR | Status: AC | PRN
  Administered 2019-08-23: 100 mL via INTRAVENOUS

## 2019-08-23 NOTE — Progress Notes (Signed)
[facility] Evan Page 59 y.o.   Chief Complaint  Patient presents with  . Follow-up    x2 weeks. Acute diverticulitis. Pt stated that he is still having some Lt flank pain since last OV    HISTORY OF PRESENT ILLNESS: This is a 59 y.o. male here for 2-week follow-up of left-sided abdominal pain.  Suspected acute diverticulitis, took Augmentin for 7 days without any significant difference.  Pain still present and localized to left lateral abdomen.  Denies fever or chills.  Able to eat and drink.  Denies nausea or vomiting.  Denies diarrhea or constipation.  No new symptomatology but still persistent pain. CT scan of abdomen and pelvis was ordered on 08/09/2019 but still has not been scheduled.  HPI   Prior to Admission medications   Medication Sig Start Date End Date Taking? Authorizing Provider  sildenafil (VIAGRA) 100 MG tablet TAKE 1/2-1 TABLET BY MOUTH DAILY AS NEEDED FOR ERECTILE DYSFUNCTION 03/12/19  Yes Jayven Naill, Ines Bloomer, MD    No Known Allergies  Patient Active Problem List   Diagnosis Date Noted  . Obesity (BMI 30.0-34.9) 03/27/2018  . Obstructive sleep apnea on CPAP 11/01/2017  . Erectile dysfunction 11/01/2017  . Paroxysmal atrial fibrillation ([place]) 03/03/2017    Past Medical History:  Diagnosis Date  . Atrial fibrillation ([place])    a. 04/2017 s/p PVI.  Marland Kitchen Cataract    left   . GERD (gastroesophageal reflux disease)   . Sleep apnea    "waiting on my CPAP" (05/23/2017)    Past Surgical History:  Procedure Laterality Date  . ATRIAL FIBRILLATION ABLATION N/A 05/06/2017   Procedure: ATRIAL FIBRILLATION ABLATION;  Surgeon: Constance Haw, MD;  Location: [place] [facility];  Service: Cardiovascular;  Laterality: N/A;  . CARDIOVERSION N/A 03/04/2017   Procedure: CARDIOVERSION;  Surgeon: Adrian Prows, MD;  Location: [place];  Service: Cardiovascular;  Laterality: N/A;  . LEFT HEART CATH AND CORONARY ANGIOGRAPHY N/A 05/24/2017   Procedure: LEFT HEART CATH AND  CORONARY ANGIOGRAPHY possible angioplasty;  Surgeon: Nigel Mormon, MD;  Location: [place] [facility];  Service: Cardiovascular;  Laterality: N/A;  . WISDOM TOOTH EXTRACTION     3 removed - 1 remains    Social History   Socioeconomic History  . Marital status: Divorced    Spouse name: Not on file  . Number of children: 2  . Years of education: Not on file  . Highest education level: Not on file  Occupational History  . Not on file  Tobacco Use  . Smoking status: Former Smoker    Packs/day: 1.00    Years: 20.00    Pack years: 20.00    Types: Cigarettes    Quit date: 1998    Years since quitting: 23.5  . Smokeless tobacco: Never Used  Vaping Use  . Vaping Use: Never used  Substance and Sexual Activity  . Alcohol use: Yes    Alcohol/week: 3.0 standard drinks    Types: 3 Glasses of wine per week  . Drug use: No  . Sexual activity: Yes  Other Topics Concern  . Not on file  Social History Narrative   ** Merged History Encounter **       Social Determinants of Health   Financial Resource Strain:   . Difficulty of Paying Living Expenses:   Food Insecurity:   . Worried About Charity fundraiser in the Last Year:   . Arboriculturist in the Last Year:   Transportation Needs:   .  Lack of Transportation (Medical):   Marland Kitchen Lack of Transportation (Non-Medical):   Physical Activity:   . Days of Exercise per Week:   . Minutes of Exercise per Session:   Stress:   . Feeling of Stress :   Social Connections:   . Frequency of Communication with Friends and Family:   . Frequency of Social Gatherings with Friends and Family:   . Attends Religious Services:   . Active Member of Clubs or Organizations:   . Attends Archivist Meetings:   Marland Kitchen Marital Status:   Intimate Partner Violence:   . Fear of Current or Ex-Partner:   . Emotionally Abused:   Marland Kitchen Physically Abused:   . Sexually Abused:     Family History  Problem Relation Age of Onset  . Diabetes Mother   .  Hyperlipidemia Mother   . Diabetes Father   . Hyperlipidemia Father   . Esophageal cancer Father   . Drug abuse Sister   . Hyperlipidemia Maternal Grandmother   . Colon cancer Neg Hx   . Pancreatic cancer Neg Hx   . Prostate cancer Neg Hx   . Rectal cancer Neg Hx   . Stomach cancer Neg Hx      Review of Systems  Constitutional: Negative.  Negative for chills and fever.  HENT: Negative.  Negative for congestion and sore throat.   Respiratory: Negative.  Negative for cough and shortness of breath.   Cardiovascular: Negative.  Negative for chest pain and palpitations.  Gastrointestinal: Positive for abdominal pain. Negative for blood in stool, constipation, diarrhea, melena, nausea and vomiting.  Genitourinary: Negative.  Negative for dysuria and hematuria.  Skin: Negative.  Negative for rash.  Neurological: Negative for dizziness and headaches.  All other systems reviewed and are negative.  Today's Vitals   08/23/19 1422  BP: (!) 125/64  Pulse: 71  Temp: 98 F (36.7 C)  TempSrc: Temporal  SpO2: 97%  Weight: (!) 231 lb 6.4 oz (105 kg)  Height: 5\' 8"  (1.727 m)   Body mass index is 35.18 kg/m.   Physical Exam Vitals reviewed.  Constitutional:      Appearance: Normal appearance.  HENT:     Head: Normocephalic.  Eyes:     Extraocular Movements: Extraocular movements intact.     Conjunctiva/sclera: Conjunctivae normal.     Pupils: Pupils are equal, round, and reactive to light.  Cardiovascular:     Rate and Rhythm: Normal rate and regular rhythm.     Pulses: Normal pulses.     Heart sounds: Normal heart sounds.  Pulmonary:     Effort: Pulmonary effort is normal.     Breath sounds: Normal breath sounds.  Abdominal:     General: There is no distension.     Palpations: There is mass (left abdomen r/o hernia).     Tenderness: There is abdominal tenderness (LLQ). There is no right CVA tenderness, left CVA tenderness or guarding.  Musculoskeletal:        General:  Normal range of motion.     Cervical back: Normal range of motion and neck supple.  Skin:    General: Skin is warm and dry.     Capillary Refill: Capillary refill takes less than 2 seconds.  Neurological:     General: No focal deficit present.     Mental Status: He is alert and oriented to person, place, and time.  Psychiatric:        Mood and Affect: Mood normal.  Behavior: Behavior normal.    A total of 30 minutes was spent with the patient, greater than 50% of which was in counseling/coordination of care regarding differential diagnosis of abdominal pain, review of most recent office visit note, review of most recent blood work results, need to get CT scan of abdomen and pelvis done today, prognosis and need for follow-up.   ASSESSMENT & PLAN: Evan Page was seen today for follow-up.  Diagnoses and all orders for this visit:  Lower abdominal pain -     CT ABDOMEN PELVIS W WO CONTRAST; Future  Erectile dysfunction, unspecified erectile dysfunction type -     sildenafil (VIAGRA) 100 MG tablet; Take 1 tablet (100 mg total) by mouth as needed for erectile dysfunction.  Left lower quadrant abdominal pain -     CT Abdomen Pelvis W Contrast; Future    Patient Instructions       If you have lab work done today you will be contacted with your lab results within the next 2 weeks.  If you have not heard from [place] then please contact us. The fastest way to get your results is to register for My Chart.   IF you received an x-ray today, you will receive an invoice from [facility] Radiology. Please contact [facility] Radiology at [phone] with questions or concerns regarding your invoice.   IF you received labwork today, you will receive an invoice from [place]. Please contact [facility] at [phone] with questions or concerns regarding your invoice.   Our billing staff will not be able to assist you with questions regarding bills from these companies.  You will be contacted  with the lab results as soon as they are available. The fastest way to get your results is to activate your My Chart account. Instructions are located on the last page of this paperwork. If you have not heard from [place] regarding the results in 2 weeks, please contact this office.     Dolor abdominal en los adultos Abdominal Pain, Adult El dolor de [place] (abdominal) puede tener muchas causas. [place] veces, el dolor de [place] no es peligroso. Muchos de [employer] de dolor de estmago pueden controlarse y tratarse en casa. Sin embargo, a Clinical cytogeneticist, Conservation officer, historic buildings de [place] de [place] es grave. El mdico intentar descubrir la causa del dolor de [place]. Siga estas instrucciones en su casa:  Medicamentos  [employer] de venta libre y los recetados solamente como se lo haya indicado el mdico.  No tome medicamentos que lo ayuden a Landscape architect (laxantes), salvo que el mdico se lo indique. Instrucciones generales  Est atento al dolor de estmago para Actuary cambio.  Beba suficiente lquido para Contractor pis (la orina) de color amarillo plido.  Concurra a todas las visitas de seguimiento como se lo haya indicado el mdico. Esto es importante. Comunquese con un mdico si:  El dolor de estmago cambia o [place].  No tiene apetito o baja de peso sin proponrselo.  Tiene dificultades para defecar (est estreido) o heces lquidas (diarrea) durante ms de 2 o 3das.  Siente dolor al orinar o defecar.  El dolor de estmago lo despierta de noche.  El dolor empeora con las comidas, despus de comer o con determinados alimentos.  Tiene vmitos y no puede retener nada de lo que ingiere.  Tiene fiebre.  Observa sangre en la orina. Solicite ayuda de inmediato si:  El dolor no desaparece en el tiempo indicado por el mdico.  No puede dejar de vomitar.  Siente dolor solamente en zonas especficas del abdomen, como el lado derecho o la parte inferior izquierda.  Tiene  heces con sangre, de color negro o con aspecto alquitranado.  Tiene dolor muy intenso en el vientre, clicos o meteorismo.  Presenta signos de no tener suficientes lquidos o agua en el cuerpo (deshidratacin), por ejemplo: ? [place] Else, muy escasa o falta de orina. ? Labios agrietados. ? Sequedad de boca. ? Ojos hundidos. ? Somnolencia. ? Debilidad.  Tiene dificultad para respirar o Tourist information centre manager. Resumen  Muchos de [employer] de dolor de estmago pueden controlarse y tratarse en casa.  Est atento al dolor de estmago para Actuary cambio.  Tome los medicamentos de venta libre y los recetados solamente como se lo haya indicado el mdico.  Comunquese con un mdico si el dolor de estmago cambia o [place].  Busque ayuda de inmediato si tiene dolor muy intenso en el vientre, clicos o meteorismo. Esta informacin no tiene Marine scientist el consejo del mdico. Asegrese de hacerle al mdico cualquier pregunta que tenga. Document Revised: 07/26/2018 Document Reviewed: 07/26/2018 Elsevier Patient Education  2020 Elsevier Inc.      Agustina Caroli, MD [facility]

## 2019-08-23 NOTE — Patient Instructions (Addendum)
   If you have lab work done today you will be contacted with your lab results within the next 2 weeks.  If you have not heard from us then please contact us. The fastest way to get your results is to register for My Chart.   IF you received an x-ray today, you will receive an invoice from [facility]. Please contact [facility] at [phone] with questions or concerns regarding your invoice.   IF you received labwork today, you will receive an invoice from [facility]. Please contact [facility] at [phone] with questions or concerns regarding your invoice.   Our billing staff will not be able to assist you with questions regarding bills from these companies.  You will be contacted with the lab results as soon as they are available. The fastest way to get your results is to activate your My Chart account. Instructions are located on the last page of this paperwork. If you have not heard from us regarding the results in 2 weeks, please contact this office.     Dolor abdominal en los adultos Abdominal Pain, Adult El dolor de estmago (abdominal) puede tener muchas causas. La mayora de las veces, el dolor de estmago no es peligroso. Muchos de estos casos de dolor de estmago pueden controlarse y tratarse en casa. Sin embargo, a veces, el dolor de estmago es grave. El mdico intentar descubrir la causa del dolor de estmago. Siga estas instrucciones en su casa:  Medicamentos  Tome los medicamentos de venta libre y los recetados solamente como se lo haya indicado el mdico.  No tome medicamentos que lo ayuden a defecar (laxantes), salvo que el mdico se lo indique. Instrucciones generales  Est atento al dolor de estmago para detectar cualquier cambio.  Beba suficiente lquido para mantener el pis (la orina) de color amarillo plido.  Concurra a todas las visitas de seguimiento como se lo haya indicado el mdico. Esto es importante. Comunquese con un mdico  si:  El dolor de estmago cambia o empeora.  No tiene apetito o baja de peso sin proponrselo.  Tiene dificultades para defecar (est estreido) o heces lquidas (diarrea) durante ms de 2 o 3das.  Siente dolor al orinar o defecar.  El dolor de estmago lo despierta de noche.  El dolor empeora con las comidas, despus de comer o con determinados alimentos.  Tiene vmitos y no puede retener nada de lo que ingiere.  Tiene fiebre.  Observa sangre en la orina. Solicite ayuda de inmediato si:  El dolor no desaparece en el tiempo indicado por el mdico.  No puede dejar de vomitar.  Siente dolor solamente en zonas especficas del abdomen, como el lado derecho o la parte inferior izquierda.  Tiene heces con sangre, de color negro o con aspecto alquitranado.  Tiene dolor muy intenso en el vientre, clicos o meteorismo.  Presenta signos de no tener suficientes lquidos o agua en el cuerpo (deshidratacin), por ejemplo: ? Orina oscura, muy escasa o falta de orina. ? Labios agrietados. ? Sequedad de boca. ? Ojos hundidos. ? Somnolencia. ? Debilidad.  Tiene dificultad para respirar o dolor en el pecho. Resumen  Muchos de estos casos de dolor de estmago pueden controlarse y tratarse en casa.  Est atento al dolor de estmago para detectar cualquier cambio.  Tome los medicamentos de venta libre y los recetados solamente como se lo haya indicado el mdico.  Comunquese con un mdico si el dolor de estmago cambia o empeora.  Busque ayuda de   inmediato si tiene dolor muy intenso en el vientre, clicos o meteorismo. Esta informacin no tiene como fin reemplazar el consejo del mdico. Asegrese de hacerle al mdico cualquier pregunta que tenga. Document Revised: 07/26/2018 Document Reviewed: 07/26/2018 Elsevier Patient Education  2020 Elsevier Inc.  

## 2019-08-24 ENCOUNTER — Encounter: Payer: Self-pay | Admitting: Emergency Medicine

## 2019-08-28 ENCOUNTER — Other Ambulatory Visit: Payer: Self-pay | Admitting: Emergency Medicine

## 2019-08-28 DIAGNOSIS — K439 Ventral hernia without obstruction or gangrene: Secondary | ICD-10-CM

## 2019-09-03 ENCOUNTER — Ambulatory Visit: Payer: BC Managed Care – PPO | Admitting: Cardiology

## 2019-09-03 ENCOUNTER — Other Ambulatory Visit: Payer: Self-pay

## 2019-09-03 ENCOUNTER — Encounter: Payer: Self-pay | Admitting: Cardiology

## 2019-09-03 VITALS — BP 133/74 | HR 63 | Resp 16 | Ht 68.0 in | Wt 233.4 lb

## 2019-09-03 DIAGNOSIS — Z8679 Personal history of other diseases of the circulatory system: Secondary | ICD-10-CM

## 2019-09-03 DIAGNOSIS — G4733 Obstructive sleep apnea (adult) (pediatric): Secondary | ICD-10-CM

## 2019-09-03 DIAGNOSIS — Z9989 Dependence on other enabling machines and devices: Secondary | ICD-10-CM

## 2019-09-03 DIAGNOSIS — E6609 Other obesity due to excess calories: Secondary | ICD-10-CM

## 2019-09-03 NOTE — Progress Notes (Signed)
Subjective:   [facility], male    DOB: 1960-04-04, 59 y.o.   MRN: [id number]  Horald Pollen, MD:  Chief Complaint  Patient presents with  . Paroxysmal Atrial Fibrillation  . Follow-up    6 month    HPI: [facility]  is a 59 y.o. male  with mild to moderate obesity, severe sleep apnea on CPAP and tolerating this well, chronic stage III kidney disease, atrial fibrillation and failed cardioversion S/P atrial fibrillation ablation by Dr. Curt Bears on 05/06/2017, had atrial flutter on 05/23/2017 when he presented with chest pain(normal coronary angiogram) and underwent cardioversion and has maintained sinus rhythm since.  Flecainide was discontinued as he maintained sinus rhythm.  He now presents for annual visit accompanied with his wife.  He has no specific complaints today.  Has resumed all his activities without any limitations.  Denies chest pain, dyspnea or palpitations.  Past Medical History:  Diagnosis Date  . Atrial fibrillation ([place])    a. 04/2017 s/p PVI.  Marland Kitchen Cataract    left   . GERD (gastroesophageal reflux disease)   . Sleep apnea    "waiting on my CPAP" (05/23/2017)    Past Surgical History:  Procedure Laterality Date  . ATRIAL FIBRILLATION ABLATION N/A 05/06/2017   Procedure: ATRIAL FIBRILLATION ABLATION;  Surgeon: Constance Haw, MD;  Location: [place] [facility];  Service: Cardiovascular;  Laterality: N/A;  . CARDIOVERSION N/A 03/04/2017   Procedure: CARDIOVERSION;  Surgeon: Adrian Prows, MD;  Location: [facility];  Service: Cardiovascular;  Laterality: N/A;  . LEFT HEART CATH AND CORONARY ANGIOGRAPHY N/A 05/24/2017   Procedure: LEFT HEART CATH AND CORONARY ANGIOGRAPHY possible angioplasty;  Surgeon: Nigel Mormon, MD;  Location: [place] [facility];  Service: Cardiovascular;  Laterality: N/A;  . WISDOM TOOTH EXTRACTION     3 removed - 1 remains    Family History  Problem Relation Age of Onset  . Diabetes Mother     . Hyperlipidemia Mother   . Diabetes Father   . Hyperlipidemia Father   . Esophageal cancer Father   . Drug abuse Sister   . Hyperlipidemia Maternal Grandmother   . Colon cancer Neg Hx   . Pancreatic cancer Neg Hx   . Prostate cancer Neg Hx   . Rectal cancer Neg Hx   . Stomach cancer Neg Hx    Social History   Tobacco Use  . Smoking status: Former Smoker    Packs/day: 1.00    Years: 20.00    Pack years: 20.00    Types: Cigarettes    Quit date: 1998    Years since quitting: 23.6  . Smokeless tobacco: Never Used  Substance Use Topics  . Alcohol use: Yes    Alcohol/week: 3.0 standard drinks    Types: 3 Glasses of wine per week    Comment: occasionally   Marital Status: Married   Current Meds  Medication Sig  . sildenafil (VIAGRA) 100 MG tablet Take 1 tablet (100 mg total) by mouth as needed for erectile dysfunction.   Review of Systems  Cardiovascular: Negative for chest pain, dyspnea on exertion and leg swelling.  Respiratory: Positive for snoring.   Gastrointestinal: Negative for melena.   Objective:   Blood pressure (!) 143/74, pulse 63, resp. rate 16, height 5\' 8"  (1.727 m), weight 233 lb 6.4 oz (105.9 kg), SpO2 98 %.   Vitals with BMI 09/03/2019 08/23/2019 08/09/2019  Height 5\' 8"  5\' 8"  5\' 8"   Weight 233 lbs 6 oz 231 lbs 6 oz 228 lbs  BMI 35.5 66.29 47.65  Systolic 465 035 465  Diastolic 74 64 54  Pulse 63 71 66   Physical Exam Vitals reviewed.  Constitutional:      Appearance: He is well-developed. He is obese.  Cardiovascular:     Rate and Rhythm: Normal rate and regular rhythm.     Pulses: Intact distal pulses.     Heart sounds: Normal heart sounds.  Pulmonary:     Effort: Pulmonary effort is normal. No accessory muscle usage or respiratory distress.     Breath sounds: Normal breath sounds.  Abdominal:     General: Bowel sounds are normal.     Palpations: Abdomen is soft.   Cardiac studies:  Exercise nuclear stress test 02/18/2017: Resting EKG  normal sinus rhythm with frequent PACs.  Stress EKG negative for occult ischemia.  Patient developed A. fib with RVR at peak exercise and remained in atrial fibrillation at the end of the study. Normal myocardial perfusion scan, LVEF 55%.  Echocardiogram 02/14/2017: Left ventricle cavity is normal in size. Normal global wall motion. Unable to evaluate diastolic function due to A. Fibrillation. Calculated EF 68%. Trace mitral regurgitation.  Coronary angiogram 05/24/2017: Left dominant normal coronaries. Normal LVEF.   EKG:  EKG 09/03/2019: Normal sinus rhythm with rate of 60 bpm, normal axis.  Incomplete right bundle branch block.  Early R wave progression, consider RVH versus true posterior infarct.  No evidence of ischemia, normal QT interval.   No significant change from EKG 09/03/2019, 03/27/2018  Assessment & Recommendations:     ICD-10-CM   1. History of atrial fibrillation  Z86.79 EKG 12-Lead  2. Obstructive sleep apnea on CPAP  G47.33    Z99.89   3. Class 2 obesity due to excess calories without serious comorbidity with body mass index (BMI) of 35.0 to 35.9 in adult  E66.09    Z68.35      Recommendation:  [facility]  is a 59 y.o. male  with mild to moderate obesity, severe sleep apnea on CPAP and tolerating this well, chronic stage III kidney disease, atrial fibrillation and failed cardioversion S/P atrial fibrillation ablation by Dr. Curt Bears on 05/06/2017, had atrial flutter on 05/23/2017 when he presented with chest pain(normal coronary angiogram) and underwent cardioversion and has maintained sinus rhythm since.  Flecainide was discontinued as he maintained sinus rhythm.  He now presents for annual visit accompanied with his wife.  He is presently doing well, no change in his EKG which is abnormal suggestive of posterior infarct but has normal echocardiogram and normal LVEF by stress test and also cardiac catheterization.  Suspect it is a normal variant.  He has  been compliant with CPAP.  I have discussed with him regarding obesity related to recurrence of atrial fibrillation and the risk of atrial fibrillation.  Wellbeing and healthy habits discussed at length with patient and his wife.  He is presently moderately obese.  Otherwise from cardiac standpoint he has remained stable, I will see him back on a as needed basis.   Adrian Prows, MD, [facility] 09/06/2019, 3:43 PM Office: [phone]

## 2019-09-03 NOTE — Patient Instructions (Addendum)

## 2019-10-29 ENCOUNTER — Encounter: Payer: Self-pay | Admitting: Neurology

## 2019-10-29 ENCOUNTER — Other Ambulatory Visit: Payer: Self-pay

## 2019-10-29 ENCOUNTER — Ambulatory Visit: Payer: BC Managed Care – PPO | Admitting: Neurology

## 2019-10-29 VITALS — BP 154/77 | HR 74 | Ht 68.0 in | Wt 235.0 lb

## 2019-10-29 DIAGNOSIS — Z9989 Dependence on other enabling machines and devices: Secondary | ICD-10-CM

## 2019-10-29 DIAGNOSIS — G4733 Obstructive sleep apnea (adult) (pediatric): Secondary | ICD-10-CM | POA: Diagnosis not present

## 2019-10-29 NOTE — Patient Instructions (Signed)
It was nice to see you again today.  You are fully compliant with your CPAP, keep up the good work!  I have placed a prescription to renew your supplies on a regular basis.  Please continue using your CPAP regularly. While your insurance requires that you use CPAP at least 4 hours each night on 70% of the nights, I recommend, that you not skip any nights and use it throughout the night if you can. Getting used to CPAP and staying with the treatment long term does take time and patience and discipline. Untreated obstructive sleep apnea when it is moderate to severe can have an adverse impact on cardiovascular health and raise her risk for heart disease, arrhythmias, hypertension, congestive heart failure, stroke and diabetes. Untreated obstructive sleep apnea causes sleep disruption, nonrestorative sleep, and sleep deprivation. This can have an impact on your day to day functioning and cause daytime sleepiness and impairment of cognitive function, memory loss, mood disturbance, and problems focussing. Using CPAP regularly can improve these symptoms.  Please follow-up routinely in 1 year.  Please continue to work on weight loss.

## 2019-10-29 NOTE — Progress Notes (Signed)
Subjective:    Patient ID: Evan Page is a 59 y.o. male.  HPI    Interim history:   Evan Page is a 59 year old right-handed gentleman with an underlying medical history of atrial fibrillation, hyperlipidemia, and obesity, who presents for follow-up consultation of his obstructive sleep apnea, on CPAP therapy. The patient is accompanied by a Spanish interpreter today. I last saw him on 10/20/2017, at which time he was compliant with his CPAP and reported feeling better.  He had a recent ablation procedure for his A. fib as well as cardioversion.  He did not have an appointment last year.  Today, 10/29/2019: I reviewed his CPAP compliance data from 09/25/2019 through 10/24/2019, which is a total of 30 days, during which time he used his machine every night with percent use days greater than 4 hours at 100%, indicating superb compliance, average usage of 7 hours and 12 minutes, residual AHI at goal at 0.5/h, leak on the low side with a 95th percentile at 1.4 L/min, pressure of 9 cm with EPR of 3.  He reports doing well with his CPAP, he is fully compliant with treatment and continues to benefit from it.  He had a checkup with Dr. Einar Gip recently and his cardiac exam is stable.  He is up-to-date with his supplies, likes to use the P 30i nasal pillows from [employer].  He has purchased an ozone cleaner that he attaches to his house, I am not familiar with this model, and since CPAP cleaner on it it is a small device, a little bigger than the palm of the hand in size. Back of the device says [employer] on it.  He uses distilled water in the machine.  He has had a surplus of supplies and has been changing his pillows in the filter on a regular basis.  I reviewed his cardiology office visit note from 09/03/2019.  He reports that he recently had abdominal pain.  He eventually had a CT scan.  I reviewed the report of his abdominal CT scan from July 2021.  There was evidence of colonic diverticula  without any evidence of diverticulitis.   The patient's allergies, current medications, family history, past medical history, past social history, past surgical history and problem list were reviewed and updated as appropriate.    Previously:  I first met him on 03/10/2017 at the request of his cardiologist, at which time patient reported snoring and daytime somnolence as well as breathing pauses while asleep. He was advised to proceed with a sleep study. He had a split-night sleep study on 04/21/2017. I went over his test results with him in detail today. Sleep efficiency was 89% at baseline with a sleep latency of 8.5 minutes and REM latency of 54.5 minutes. He had a total AHI of 38.6 per hour, EKG showed PVCs and A. fib. His REM AHI was 64 per hour, supine AHI 60.6 per hour, average oxygen saturation of only 88% with a nadir of 67%, he had no significant PLMS. He was started on CPAP at 5 cm via nasal pillows and advanced 8 cm, and the final pressure his AHI was 2.2 per hour with nonsupine REM sleep achieved an O2 nadir of 89%. I suggested a home treatment pressure of 9 cm. I reviewed his CPAP compliance data from 09/19/2017 through 10/18/2017 which is a total of 30 days, during which time he used his CPAP 29 days with percent used days greater than 4 hours at 97%, indicating excellent compliance with an  average usage of 6 hours and 30 minutes, residual AHI at goal at 1.4 per hour, leak acceptable on the low side with the 95th percentile at 5.2 L/m on a pressure of 9 cm with EPR of 3.     03/10/2017: (He) reports snoring and excessive daytime somnolence. I reviewed your office note from 02/13/2017, which you kindly included. His Epworth sleepiness score is 20 out of 24 today, fatigue score is 58 out of 63. He reports loud snoring and also positive in his breathing per girlfriend report. He lives alone, girlfriend lives in [place]. He has one son and one daughter. His son lives with them along with his  family which includes 2 children. Patient bedtime is around 9, rise time around 4. He has to be at work at 5. 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 He has nocturia about twice per average night. He has had occasional morning headaches. He drinks caffeine in the form of coffee, usually just one cup per day, typically no sodas. He quit smoking some 20 years ago. He does drink occasional wine, 1-2 glasses, namely on weekends, not daily. Of note, the patient had a elective cardioversion on 03/04/2017. I reviewed the records. He was in sinus rhythm after his cardioversion.  His Past Medical History Is Significant For: Past Medical History:  Diagnosis Date  . Atrial fibrillation ([facility])    a. 04/2017 s/p PVI.  . Cataract    left   . GERD (gastroesophageal reflux disease)   . Sleep apnea    "waiting on my CPAP" (05/23/2017)    His Past Surgical History Is Significant For: Past Surgical History:  Procedure Laterality Date  . ATRIAL FIBRILLATION ABLATION N/A 05/06/2017   Procedure: ATRIAL FIBRILLATION ABLATION;  Surgeon: Camnitz, Will Martin, MD;  Location: [facility];  Service: Cardiovascular;  Laterality: N/A;  . CARDIOVERSION N/A 03/04/2017   Procedure: CARDIOVERSION;  Surgeon: Ganji, Jay, MD;  Location: [facility];  Service: Cardiovascular;  Laterality: N/A;  . LEFT HEART CATH AND CORONARY ANGIOGRAPHY N/A 05/24/2017   Procedure: LEFT HEART CATH AND CORONARY ANGIOGRAPHY possible angioplasty;  Surgeon: Patwardhan, Manish J, MD;  Location: [facility];  Service: Cardiovascular;  Laterality: N/A;  . WISDOM TOOTH EXTRACTION     3 removed - 1 remains    His Family History Is Significant For: Family History  Problem Relation Age of Onset  . Diabetes Mother   . Hyperlipidemia Mother   . Diabetes Father   . Hyperlipidemia Father   . Esophageal cancer Father   . Drug abuse Sister   . Hyperlipidemia Maternal Grandmother   . Colon cancer Neg Hx   . Pancreatic cancer Neg  Hx   . Prostate cancer Neg Hx   . Rectal cancer Neg Hx   . Stomach cancer Neg Hx     His Social History Is Significant For: Social History   Socioeconomic History  . Marital status: Married    Spouse name: Not on file  . Number of children: 2  . Years of education: Not on file  . Highest education level: Not on file  Occupational History  . Not on file  Tobacco Use  . Smoking status: Former Smoker    Packs/day: 1.00    Years: 20.00    Pack years: 20.00    Types: Cigarettes    Quit date: 1998    Years since quitting: 23.7  . Smokeless tobacco: Never Used  Vaping Use  .   Vaping Use: Never used  Substance and Sexual Activity  . Alcohol use: Yes    Alcohol/week: 3.0 standard drinks    Types: 3 Glasses of wine per week    Comment: occasionally  . Drug use: No  . Sexual activity: Yes  Other Topics Concern  . Not on file  Social History Narrative   ** Merged History Encounter **       Social Determinants of Health   Financial Resource Strain:   . Difficulty of Paying Living Expenses: Not on file  Food Insecurity:   . Worried About Running Out of Food in the Last Year: Not on file  . Ran Out of Food in the Last Year: Not on file  Transportation Needs:   . Lack of Transportation (Medical): Not on file  . Lack of Transportation (Non-Medical): Not on file  Physical Activity:   . Days of Exercise per Week: Not on file  . Minutes of Exercise per Session: Not on file  Stress:   . Feeling of Stress : Not on file  Social Connections:   . Frequency of Communication with Friends and Family: Not on file  . Frequency of Social Gatherings with Friends and Family: Not on file  . Attends Religious Services: Not on file  . Active Member of Clubs or Organizations: Not on file  . Attends Club or Organization Meetings: Not on file  . Marital Status: Not on file    His Allergies Are:  No Known Allergies:   His Current Medications Are:  Outpatient Encounter Medications as of  10/29/2019  Medication Sig  . sildenafil (VIAGRA) 100 MG tablet Take 1 tablet (100 mg total) by mouth as needed for erectile dysfunction.   No facility-administered encounter medications on file as of 10/29/2019.  :  Review of Systems:  Out of a complete 14 point review of systems, all are reviewed and negative with the exception of these symptoms as listed below: Review of Systems  Neurological:       Pt presents today to follow up on his cpap. He feels "excellent" while using cpap therapy.    Objective:  Neurological Exam  Physical Exam Physical Examination:   Vitals:   10/29/19 1408  BP: (!) 154/77  Pulse: 74    General Examination: The patient is a very pleasant 59 y.o. male in no acute distress. He appears well-developed and well-nourished and well groomed.   HEENT:Normocephalic, atraumatic, pupils are equal, round and reactive to light and accommodation. He wears corrective eyeglasses.Extraocular tracking is good without limitation to gaze excursion or nystagmus noted. Normal smooth pursuit is noted. Hearing is grossly intact. Face is symmetric with normal facial animation and normal facial sensation. Speech is clear with no dysarthria noted. There is no hypophonia. There is no lip, neck/head, jaw or voice tremor. Neck is supple with full range of passive and active motion. Oropharynx exam reveals: mildmouth dryness, gooddental hygiene and moderateairway crowding. Tongue protrudes centrally and palate elevates symmetrically. Stable exam.  Chest:Clear to auscultation without wheezing, rhonchi or crackles noted.  Heart:S1+S2+0,regular, nomurmurs, rubs or gallops noted.   Abdomen:Soft, non-tender and non-distended with normal bowel sounds appreciated on auscultation.  Extremities:There istrace pitting edema in the distal lower extremities bilaterally.   Skin: Warm and dry without trophic changes noted.  Musculoskeletal: exam reveals no obvious joint  deformities, tenderness or joint swelling or erythema.   Neurologically:  Mental status: The patient is awake, alert and oriented in all 4 spheres.Hisimmediate and remote memory, attention,   language skills and fund of knowledge are appropriate. There is no evidence of aphasia, agnosia, apraxia or anomia. Speech is clear with normal prosody and enunciation. Thought process is linear. Mood is normaland affect is normal.  Cranial nerves II - XII are as described above under HEENT exam.  Motor exam: Normal bulk, strength and tone is noted. There is no drift, tremor or rebound. Romberg is negative. Fine motor skills and coordination: grossly intact.  Cerebellar testing: No dysmetria or intention tremor. There is no truncal or gait ataxia.  Sensory exam: intact to light touchinthe upper and lower extremities.  Gait, station and balance:Hestands easily. No veering to one side is noted. No leaning to one side is noted. Posture is age-appropriate and stance is narrow based. Gait showsnormalstride length and normalpace. Stable exam. Assessmentand Plan:  In summary,Evan Pageis a very pleasant 59-year oldmalewith an underlying medical history of atrial fibrillation, hyperlipidemia, and obesity, who presents for follow-up consultation of his severe obstructive sleep apnea.  He had a split-night sleep study testing on 04/21/2017.  He has established CPAP therapy.  He is fully compliant with treatment at a pressure of 9 cm via nasal pillows.  He is commended for his treatment adherence.  He has ongoing benefit from CPAP therapy and is motivated to continue with treatment.  He is generally up-to-date with his supplies.  He has purchased a small ozone based CPAP cleaner but I am not familiar with this model.  He has not noticed any adverse effects from using this device which he hooks up to the hose.  He typically changes his supplies on a regular basis as well.  He is advised to follow-up  routinely in 1 year.  I placed an order for replacement CPAP supplies and we will send the order to his DME company, [employer].  He is advised to call us with any interim questions or concerns.  I answered all his questions today and he was in agreement with the plan.  I spent 30 minutes in total face-to-face time and in reviewing records during pre-charting, more than 50% of which was spent in counseling and coordination of care, reviewing test results, reviewing medications and treatment regimen and/or in discussing or reviewing the diagnosis of OSA, the prognosis and treatment options. Pertinent laboratory and imaging test results that were available during this visit with the patient were reviewed by me and considered in my medical decision making (see chart for details).    

## 2019-11-30 ENCOUNTER — Encounter: Payer: Self-pay | Admitting: Registered Nurse

## 2019-11-30 ENCOUNTER — Ambulatory Visit: Payer: BC Managed Care – PPO | Admitting: Registered Nurse

## 2019-11-30 ENCOUNTER — Other Ambulatory Visit: Payer: Self-pay

## 2019-11-30 VITALS — BP 132/78 | HR 78 | Temp 98.0°F | Resp 18 | Ht 68.0 in | Wt 240.0 lb

## 2019-11-30 DIAGNOSIS — M109 Gout, unspecified: Secondary | ICD-10-CM | POA: Diagnosis not present

## 2019-11-30 MED ORDER — INDOMETHACIN 50 MG PO CAPS: 50.0000 mg | ORAL_CAPSULE | Freq: Two times a day (BID) | ORAL | 0 refills | Status: DC

## 2019-11-30 MED ORDER — COLCHICINE 0.6 MG PO CAPS: ORAL_CAPSULE | ORAL | 0 refills | Status: DC

## 2019-11-30 MED ORDER — TRAMADOL HCL 50 MG PO TABS: 50.0000 mg | ORAL_TABLET | Freq: Three times a day (TID) | ORAL | 0 refills | Status: AC | PRN

## 2019-11-30 NOTE — Patient Instructions (Signed)
° ° ° °  If you have lab work done today you will be contacted with your lab results within the next 2 weeks.  If you have not heard from us then please contact us. The fastest way to get your results is to register for My Chart. ° ° °IF you received an x-ray today, you will receive an invoice from [facility]. Please contact [facility] at [phone] with questions or concerns regarding your invoice.  ° °IF you received labwork today, you will receive an invoice from [facility]. Please contact [facility] at [phone] with questions or concerns regarding your invoice.  ° °Our billing staff will not be able to assist you with questions regarding bills from these companies. ° °You will be contacted with the lab results as soon as they are available. The fastest way to get your results is to activate your My Chart account. Instructions are located on the last page of this paperwork. If you have not heard from us regarding the results in 2 weeks, please contact this office. °  ° ° ° °

## 2019-12-05 ENCOUNTER — Ambulatory Visit: Payer: BC Managed Care – PPO | Attending: Family

## 2019-12-05 DIAGNOSIS — Z23 Encounter for immunization: Secondary | ICD-10-CM

## 2019-12-30 ENCOUNTER — Encounter: Payer: Self-pay | Admitting: Registered Nurse

## 2019-12-30 NOTE — Progress Notes (Signed)
Acute Office Visit  Subjective:    Patient ID: Evan Page, male    DOB: 04/11/1960, 59 y.o.   MRN: [id number]  Chief Complaint  Patient presents with  . Gout    Patient states he is having some Gout pain in Both feet, Right hand and knees. 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 Atrial fibrillation ([place])    a. 04/2017 s/p PVI.  Marland Kitchen Cataract    left   . GERD (gastroesophageal reflux disease)   . Sleep apnea    "waiting on my CPAP" (05/23/2017)    Past Surgical History:  Procedure Laterality Date  . ATRIAL FIBRILLATION ABLATION N/A 05/06/2017   Procedure: ATRIAL FIBRILLATION ABLATION;  Surgeon: Constance Haw, MD;  Location: [place] [facility];  Service: Cardiovascular;  Laterality: N/A;  . CARDIOVERSION N/A 03/04/2017   Procedure: CARDIOVERSION;  Surgeon: Adrian Prows, MD;  Location: [place];  Service: Cardiovascular;  Laterality: N/A;  . LEFT HEART CATH AND CORONARY ANGIOGRAPHY N/A 05/24/2017   Procedure: LEFT HEART CATH AND CORONARY ANGIOGRAPHY possible angioplasty;  Surgeon: Nigel Mormon, MD;  Location: [place] [facility] [facility];  Service: Cardiovascular;  Laterality: N/A;  . WISDOM TOOTH EXTRACTION     3 removed - 1 remains    Family History  Problem Relation Age of Onset  . Diabetes Mother   . Hyperlipidemia Mother   . Diabetes Father   . Hyperlipidemia Father   . Esophageal cancer Father   . Drug abuse Sister   . Hyperlipidemia Maternal Grandmother   . Colon cancer Neg Hx   . Pancreatic cancer Neg Hx   . Prostate cancer Neg Hx   . Rectal cancer Neg Hx   . Stomach cancer Neg Hx      Social History   Socioeconomic History  . Marital status: Married    Spouse name: Not on file  . Number of children: 2  . Years of education: Not on file  . Highest education level: Not on file  Occupational History  . Not on file  Tobacco Use  . Smoking status: Former Smoker    Packs/day: 1.00    Years: 20.00    Pack years: 20.00    Types: Cigarettes    Quit date: 1998    Years since quitting: 23.9  . Smokeless tobacco: Never Used  Vaping Use  . Vaping Use: Never used  Substance and Sexual Activity  . Alcohol use: Yes    Alcohol/week: 3.0 standard drinks    Types: 3 Glasses of wine per week    Comment: occasionally  . Drug use: No  . Sexual activity: Yes  Other Topics Concern  . Not on file  Social History Narrative   ** Merged History Encounter **       Social Determinants of Health   Financial Resource Strain:   . Difficulty of Paying Living Expenses: Not on file  Food Insecurity:   . Worried About Charity fundraiser in the Last Year: Not on file  . Ran Out of Food in the Last Year: Not on file  Transportation Needs:   . Film/video editor (Medical): Not on file  . Lack of Transportation (Non-Medical): Not on file  Physical Activity:   . Days of Exercise per Week: Not on file  . Minutes of Exercise per Session: Not on file  Stress:   . Feeling of Stress : Not on file  Social Connections:   . Frequency of Communication with Friends and Family: Not on file  . Frequency of Social Gatherings with Friends and Family: Not on file  . Attends Religious Services: Not on file  . Active Member of Clubs or Organizations: Not on file  . Attends Archivist Meetings: Not on file  . Marital Status: Not on file  Intimate Partner Violence:   . Fear of Current or Ex-Partner: Not on file  . Emotionally Abused: Not on file  . Physically Abused: Not on file  . Sexually Abused: Not on file    Outpatient Medications Prior to Visit  Medication Sig  Dispense Refill  . sildenafil (VIAGRA) 100 MG tablet Take 1 tablet (100 mg total) by mouth as needed for erectile dysfunction. 6 tablet 19   No facility-administered medications prior to visit.    No Known Allergies  Review of Systems  Constitutional: Negative.   HENT: Negative.   Eyes: Negative.   Respiratory: Negative.   Cardiovascular: Negative.   Gastrointestinal: Negative.   Genitourinary: Negative.   Musculoskeletal: Negative.   Skin: Negative.   Neurological: Negative.   Psychiatric/Behavioral: Negative.        Objective:    Physical Exam Vitals and nursing note reviewed.  Constitutional:      General: He is not in acute distress.    Appearance: Normal appearance. He is obese. He is not ill-appearing, toxic-appearing or diaphoretic.  Cardiovascular:     Rate and Rhythm: Normal rate and regular rhythm.     Heart sounds: Normal heart sounds.  Pulmonary:     Effort: Pulmonary effort is normal. No respiratory distress.     Breath sounds: Normal breath sounds.  Musculoskeletal:        General: Swelling and tenderness present.     Comments: Polyarthritic gout flare in joints noted in hpi  Skin:    General: Skin is warm and dry.     Capillary Refill: Capillary refill takes less than 2 seconds.     Findings: Erythema (gout flare, multiple joints) present.  Neurological:     General: No focal deficit present.     Mental Status: He is alert and oriented to person, place, and time. Mental status is at baseline.  Psychiatric:        Mood and Affect: Mood normal.        Behavior: Behavior normal.        Thought Content: Thought content normal.        Judgment: Judgment normal.     BP 132/78   Pulse 78   Temp 98 F (36.7 C) (Temporal)   Resp 18   Ht 5\' 8"  (1.727 m)   Wt 240 lb (108.9 kg)   SpO2 97%   BMI 36.49 kg/m  Wt Readings from Last 3 Encounters:  11/30/19 240 lb (108.9 kg)  10/29/19 235 lb (106.6 kg)  09/03/19 233 lb 6.4 oz (105.9 kg)    There are  no preventive care reminders to display for this patient.  There are no preventive care reminders to display for this patient.   Lab Results  Component Value Date   TSH  2.710 02/11/2017   Lab Results  Component Value Date   WBC 6.9 08/09/2019   HGB 15.3 08/09/2019   HCT 44.8 08/09/2019   MCV 100 (H) 08/09/2019   PLT 260 08/09/2019   Lab Results  Component Value Date   NA 143 08/09/2019   K 4.1 08/09/2019   CO2 23 08/09/2019   GLUCOSE 72 08/09/2019   BUN 18 08/09/2019   CREATININE 1.32 (H) 08/09/2019   BILITOT 0.4 08/09/2019   ALKPHOS 64 08/09/2019   AST 34 08/09/2019   ALT 50 (H) 08/09/2019   PROT 7.5 08/09/2019   ALBUMIN 4.4 08/09/2019   CALCIUM 9.2 08/09/2019   ANIONGAP 8 05/23/2017   Lab Results  Component Value Date   CHOL 169 01/03/2019   Lab Results  Component Value Date   HDL 37 (L) 01/03/2019   Lab Results  Component Value Date   LDLCALC 80 01/03/2019   Lab Results  Component Value Date   TRIG 318 (H) 01/03/2019   Lab Results  Component Value Date   CHOLHDL 4.6 01/03/2019   Lab Results  Component Value Date   HGBA1C 6.0 (H) 08/09/2019       Assessment & Plan:   Problem List Items Addressed This Visit    None    Visit Diagnoses    Acute gouty arthritis    -  Primary   Relevant Medications   Colchicine 0.6 MG CAPS   indomethacin (INDOCIN) 50 MG capsule       Meds ordered this encounter  Medications  . Colchicine 0.6 MG CAPS    Sig: Take 2 capsules immediately. Then take 1 capsule two hours later. Then take 1 capsule daily for duration of flare.    Dispense:  10 capsule    Refill:  0    Order Specific Question:   Supervising Provider    Answer:   [name], JEFFREY R [[id number]]  . indomethacin (INDOCIN) 50 MG capsule    Sig: Take 1 capsule (50 mg total) by mouth 2 (two) times daily with a meal.    Dispense:  10 capsule    Refill:  0    Order Specific Question:   Supervising Provider    Answer:   [name], JEFFREY R [[id number]]  . traMADol  (ULTRAM) 50 MG tablet    Sig: Take 1 tablet (50 mg total) by mouth every 8 (eight) hours as needed for up to 5 days.    Dispense:  5 tablet    Refill:  0    Order Specific Question:   Supervising Provider    Answer:   [name], JEFFREY R [[id number]]   PLAN  Typical gout flare, multiple joints involved  Again reviewed lifestyle modifications to avoid flares  Indomethacin, colchicine, and tramadol per orders above  Return prn  Patient encouraged to call clinic with any questions, comments, or concerns.  Maximiano Coss, NP

## 2020-02-17 NOTE — Progress Notes (Signed)
   Covid-19 Vaccination Clinic  Name:  Naftali Carchi    MRN: [id number] DOB: 01/12/1961  02/17/2020  Mr. Knaus was observed post Covid-19 immunization for 15 minutes without incident. He was provided with Vaccine Information Sheet and instruction to access the V-Safe system.   Mr. Creech was instructed to call 911 with any severe reactions post vaccine: Marland Kitchen Difficulty breathing  . Swelling of face and throat  . A fast heartbeat  . A bad rash all over body  . Dizziness and weakness   Immunizations Administered    Name Date Dose VIS Date Route   Moderna Covid-19 Booster Vaccine 12/05/2019 10:55 AM 0.25 mL 11/21/2019 Intramuscular   Manufacturer: Moderna   Lot: [id number]   [place]: 82993-716-96

## 2020-05-26 ENCOUNTER — Ambulatory Visit: Payer: BC Managed Care – PPO | Attending: Family

## 2020-05-26 DIAGNOSIS — Z23 Encounter for immunization: Secondary | ICD-10-CM

## 2020-06-28 NOTE — Progress Notes (Signed)
   Covid-19 Vaccination Clinic  Name:  Evan Page    MRN: [id number] DOB: [birth year]  06/28/2020  Evan Page was observed post Covid-19 immunization for 15 minutes without incident. He was provided with Vaccine Information Sheet and instruction to access the V-Safe system.   Evan Page was instructed to call 911 with any severe reactions post vaccine: Marland Kitchen Difficulty breathing  . Swelling of face and throat  . A fast heartbeat  . A bad rash all over body  . Dizziness and weakness   Immunizations Administered    Name Date Dose VIS Date Route   Moderna Covid-19 Booster Vaccine 05/26/2020  9:45 AM 0.25 mL 11/21/2019 Intramuscular   Manufacturer: Moderna   Lot: [id number]   [place]: 34373-578-97

## 2020-07-04 ENCOUNTER — Telehealth: Payer: Self-pay | Admitting: Emergency Medicine

## 2020-07-04 NOTE — Telephone Encounter (Signed)
1.Medication Requested: Colchicine 0.6 MG CAPS 2. Pharmacy (Name, [place], [place]): [id number] - [place], [place] - [place] [place] Phone:  [phone]  Fax:  [phone]      3. On Med List: Y  4. Last Visit with PCP: 08/23/2019  5. Next visit date with PCP: N/A   Agent: Please be advised that RX refills may take up to 3 business days. We ask that you follow-up with your pharmacy.

## 2020-07-05 ENCOUNTER — Other Ambulatory Visit: Payer: Self-pay

## 2020-07-05 ENCOUNTER — Ambulatory Visit
Admission: EM | Admit: 2020-07-05 | Discharge: 2020-07-05 | Disposition: A | Discharge status: Home / Self Care | Payer: BC Managed Care – PPO | Attending: Emergency Medicine | Admitting: Emergency Medicine

## 2020-07-05 DIAGNOSIS — M109 Gout, unspecified: Secondary | ICD-10-CM

## 2020-07-05 MED ORDER — INDOMETHACIN 50 MG PO CAPS: 50.0000 mg | ORAL_CAPSULE | Freq: Two times a day (BID) | ORAL | 0 refills | Status: DC

## 2020-07-05 MED ORDER — COLCHICINE 0.6 MG PO CAPS: ORAL_CAPSULE | ORAL | 0 refills | Status: DC

## 2020-07-05 NOTE — ED Provider Notes (Signed)
[facility]    CSN: [id number] Arrival date & time: 07/05/20  0277      History   Chief Complaint Chief Complaint  Patient presents with  . Gout    HPI Evan Page is a 60 y.o. male.   Evan Page presents with complaints of acute gout flair to right great toe. First started around 3 days ago. Similar to previous gout flairs. No injury to the toe. No numbness or tingling. He just returned from [place] following the death of his father, with a change in diet etc.     ROS per HPI, negative if not otherwise mentioned.      Past Medical History:  Diagnosis Date  . Atrial fibrillation ([place])    a. 04/2017 s/p PVI.  Marland Kitchen Cataract    left   . GERD (gastroesophageal reflux disease)   . Sleep apnea    "waiting on my CPAP" (05/23/2017)    Patient Active Problem List   Diagnosis Date Noted  . Obesity (BMI 30.0-34.9) 03/27/2018  . Obstructive sleep apnea on CPAP 11/01/2017  . Erectile dysfunction 11/01/2017  . Paroxysmal atrial fibrillation ([place]) 03/03/2017    Past Surgical History:  Procedure Laterality Date  . ATRIAL FIBRILLATION ABLATION N/A 05/06/2017   Procedure: ATRIAL FIBRILLATION ABLATION;  Surgeon: Constance Haw, MD;  Location: [facility];  Service: Cardiovascular;  Laterality: N/A;  . CARDIOVERSION N/A 03/04/2017   Procedure: CARDIOVERSION;  Surgeon: Adrian Prows, MD;  Location: [place];  Service: Cardiovascular;  Laterality: N/A;  . LEFT HEART CATH AND CORONARY ANGIOGRAPHY N/A 05/24/2017   Procedure: LEFT HEART CATH AND CORONARY ANGIOGRAPHY possible angioplasty;  Surgeon: Nigel Mormon, MD;  Location: [facility];  Service: Cardiovascular;  Laterality: N/A;  . WISDOM TOOTH EXTRACTION     3 removed - 1 remains       Home Medications    Prior to Admission medications   Medication Sig Start Date End Date Taking? Authorizing Provider  Colchicine 0.6 MG CAPS Take 2 capsules immediately.  Then take 1 capsule two hours later. Then take 1 capsule daily for duration of flare. 07/05/20   Zigmund Gottron, NP  indomethacin (INDOCIN) 50 MG capsule Take 1 capsule (50 mg total) by mouth 2 (two) times daily with a meal. 07/05/20   Augusto Gamble B, NP  sildenafil (VIAGRA) 100 MG tablet Take 1 tablet (100 mg total) by mouth as needed for erectile dysfunction. 08/23/19   Horald Pollen, MD    Family History Family History  Problem Relation Age of Onset  . Diabetes Mother   . Hyperlipidemia Mother   . Diabetes Father   . Hyperlipidemia Father   . Esophageal cancer Father   . Drug abuse Sister   . Hyperlipidemia Maternal Grandmother   . Colon cancer Neg Hx   . Pancreatic cancer Neg Hx   . Prostate cancer Neg Hx   . Rectal cancer Neg Hx   . Stomach cancer Neg Hx     Social History Social History   Tobacco Use  . Smoking status: Former Smoker    Packs/day: 1.00    Years: 20.00    Pack years: 20.00    Types: Cigarettes    Quit date: 1998    Years since quitting: 24.4  . Smokeless tobacco: Never Used  Vaping Use  . Vaping Use: Never used  Substance Use Topics  . Alcohol use: Yes    Alcohol/week: 3.0 standard drinks  Types: 3 Glasses of wine per week    Comment: occasionally  . Drug use: No     Allergies   Patient has no known allergies.   Review of Systems Review of Systems   Physical Exam Triage Vital Signs ED Triage Vitals  Enc Vitals Group     BP 07/05/20 0849 (!) 141/75     Pulse Rate 07/05/20 0849 64     Resp 07/05/20 0849 18     Temp 07/05/20 0849 98.2 F (36.8 C)     Temp Source 07/05/20 0849 Oral     SpO2 07/05/20 0849 98 %     Weight 07/05/20 0847 230 lb (104.3 kg)     Height 07/05/20 0847 5\' 8"  (1.727 m)     Head Circumference --      Peak Flow --      Pain Score 07/05/20 0846 8     Pain Loc --      Pain Edu? --      Excl. in [place]? --    No data found.  Updated Vital Signs BP (!) 141/75 (BP Location: Right Arm)   Pulse 64   Temp  98.2 F (36.8 C) (Oral)   Resp 18   Ht 5\' 8"  (1.727 m)   Wt 230 lb (104.3 kg)   SpO2 98%   BMI 34.97 kg/m   Visual Acuity Right Eye Distance:   Left Eye Distance:   Bilateral Distance:    Right Eye Near:   Left Eye Near:    Bilateral Near:     Physical Exam Constitutional:      Appearance: He is well-developed.  Cardiovascular:     Rate and Rhythm: Normal rate.  Pulmonary:     Effort: Pulmonary effort is normal.  Musculoskeletal:     Comments: Right foot great toe MTP joint with redness swelling, tenderness and mildly warm; cap refill < 2 seconds    Skin:    General: Skin is warm and dry.  Neurological:     Mental Status: He is alert and oriented to person, place, and time.      UC Treatments / Results  Labs (all labs ordered are listed, but only abnormal results are displayed) Labs Reviewed - No data to display  EKG   Radiology No results found.  Procedures Procedures (including critical care time)  Medications Ordered in UC Medications - No data to display  Initial Impression / Assessment and Plan / UC Course  I have reviewed the triage vital signs and the nursing notes.  Pertinent labs & imaging results that were available during my care of the patient were reviewed by me and considered in my medical decision making (see chart for details).     History and exam consistent with gout with colchicine and indomethacin provided. Gout education discussed as well. Patient verbalized understanding and agreeable to plan.  Ambulatory out of clinic without difficulty.    Final Clinical Impressions(s) / UC Diagnoses   Final diagnoses:  Acute gout involving toe of right foot, unspecified cause   Discharge Instructions   None    ED Prescriptions    Medication Sig Dispense Auth. Provider   Colchicine 0.6 MG CAPS Take 2 capsules immediately. Then take 1 capsule two hours later. Then take 1 capsule daily for duration of flare. 10 capsule Augusto Gamble B, NP    indomethacin (INDOCIN) 50 MG capsule Take 1 capsule (50 mg total) by mouth 2 (two) times daily with a meal. 10  capsule Zigmund Gottron, NP     PDMP not reviewed this encounter.   Zigmund Gottron, NP 07/05/20 [id number]

## 2020-07-05 NOTE — ED Triage Notes (Signed)
Patient states that he has been having a gout flare x 3 days. States that this is in his right big toe, previous history of this.

## 2020-07-08 ENCOUNTER — Other Ambulatory Visit: Payer: Self-pay

## 2020-07-08 DIAGNOSIS — M109 Gout, unspecified: Secondary | ICD-10-CM

## 2020-07-08 MED ORDER — COLCHICINE 0.6 MG PO CAPS: ORAL_CAPSULE | ORAL | 0 refills | Status: DC

## 2020-07-08 NOTE — Telephone Encounter (Signed)
Refilled medication

## 2020-09-04 ENCOUNTER — Encounter: Payer: Self-pay | Admitting: Emergency Medicine

## 2020-09-04 ENCOUNTER — Other Ambulatory Visit: Payer: Self-pay

## 2020-09-04 ENCOUNTER — Ambulatory Visit (INDEPENDENT_AMBULATORY_CARE_PROVIDER_SITE_OTHER): Payer: BC Managed Care – PPO | Admitting: Emergency Medicine

## 2020-09-04 VITALS — BP 116/60 | HR 64 | Temp 98.0°F | Ht 68.0 in | Wt 231.0 lb

## 2020-09-04 DIAGNOSIS — Z0000 Encounter for general adult medical examination without abnormal findings: Secondary | ICD-10-CM

## 2020-09-04 DIAGNOSIS — Z23 Encounter for immunization: Secondary | ICD-10-CM

## 2020-09-04 DIAGNOSIS — Z13220 Encounter for screening for lipoid disorders: Secondary | ICD-10-CM

## 2020-09-04 DIAGNOSIS — Z13228 Encounter for screening for other metabolic disorders: Secondary | ICD-10-CM | POA: Diagnosis not present

## 2020-09-04 DIAGNOSIS — Z130 Encounter for screening for diseases of the blood and blood-forming organs and certain disorders involving the immune mechanism: Secondary | ICD-10-CM

## 2020-09-04 DIAGNOSIS — Z125 Encounter for screening for malignant neoplasm of prostate: Secondary | ICD-10-CM

## 2020-09-04 DIAGNOSIS — Z1329 Encounter for screening for other suspected endocrine disorder: Secondary | ICD-10-CM | POA: Diagnosis not present

## 2020-09-04 LAB — COMPREHENSIVE METABOLIC PANEL
ALT: 66 U/L — ABNORMAL HIGH (ref 0–53)
AST: 51 U/L — ABNORMAL HIGH (ref 0–37)
Albumin: 4.2 g/dL (ref 3.5–5.2)
Alkaline Phosphatase: 57 U/L (ref 39–117)
BUN: 17 mg/dL (ref 6–23)
CO2: 25 mEq/L (ref 19–32)
Calcium: 9.2 mg/dL (ref 8.4–10.5)
Chloride: 106 mEq/L (ref 96–112)
Creatinine, Ser: 1.35 mg/dL (ref 0.40–1.50)
GFR: 56.83 mL/min — ABNORMAL LOW (ref >60.00)
Glucose, Bld: 87 mg/dL (ref 70–99)
Potassium: 4 mEq/L (ref 3.5–5.1)
Sodium: 141 mEq/L (ref 135–145)
Total Bilirubin: 0.7 mg/dL (ref 0.2–1.2)
Total Protein: 7.6 g/dL (ref 6.0–8.3)

## 2020-09-04 LAB — CBC WITH DIFFERENTIAL/PLATELET
Basophils Absolute: 0.1 10*3/uL (ref 0.0–0.1)
Basophils Relative: 0.9 % (ref 0.0–3.0)
Eosinophils Absolute: 0.1 10*3/uL (ref 0.0–0.7)
Eosinophils Relative: 0.8 % (ref 0.0–5.0)
HCT: 43.1 % (ref 39.0–52.0)
Hemoglobin: 14.4 g/dL (ref 13.0–17.0)
Lymphocytes Relative: 37.4 % (ref 12.0–46.0)
Lymphs Abs: 2.5 10*3/uL (ref 0.7–4.0)
MCHC: 33.4 g/dL (ref 30.0–36.0)
MCV: 102.7 fl — ABNORMAL HIGH (ref 78.0–100.0)
Monocytes Absolute: 0.7 10*3/uL (ref 0.1–1.0)
Monocytes Relative: 10 % (ref 3.0–12.0)
Neutro Abs: 3.4 10*3/uL (ref 1.4–7.7)
Neutrophils Relative %: 50.9 % (ref 43.0–77.0)
Platelets: 213 10*3/uL (ref 150.0–400.0)
RBC: 4.2 Mil/uL — ABNORMAL LOW (ref 4.22–5.81)
RDW: 13.4 % (ref 11.5–15.5)
WBC: 6.7 10*3/uL (ref 4.0–10.5)

## 2020-09-04 LAB — HEMOGLOBIN A1C: Hgb A1c MFr Bld: 6.2 % (ref 4.6–6.5)

## 2020-09-04 LAB — LIPID PANEL
Cholesterol: 144 mg/dL (ref 0–200)
HDL: 40 mg/dL (ref >39.00)
LDL Cholesterol: 76 mg/dL (ref 0–99)
NonHDL: 103.96
Total CHOL/HDL Ratio: 4
Triglycerides: 140 mg/dL (ref 0.0–149.0)
VLDL: 28 mg/dL (ref 0.0–40.0)

## 2020-09-04 LAB — PSA: PSA: 0.38 ng/mL (ref 0.10–4.00)

## 2020-09-04 LAB — TSH: TSH: 2.12 u[IU]/mL (ref 0.35–5.50)

## 2020-09-04 NOTE — Patient Instructions (Signed)
Mantenimiento de Teacher, English as a foreign language, Male Adoptar un estilo de vida saludable y recibir atencin preventiva son importantes para promover la salud y Musician. Consulte al mdico sobre: El esquema adecuado para hacerse pruebas y exmenes peridicos. Cosas que puede hacer por su cuenta para prevenir enfermedades y [place] sano. Qu debo saber sobre la dieta, el peso y el ejercicio? Consuma una dieta saludable  Consuma una dieta que incluya muchas verduras, frutas, productos lcteos con bajo contenido de [place] y Advertising account planner. No consuma muchos alimentos ricos en grasas slidas, azcares agregados o sodio.  Mantenga un peso saludable El ndice de masa muscular [facility]) es una medida que puede utilizarse para identificar posibles problemas de Arab. Proporciona una estimacin de la grasa corporal basndose en el peso y la altura. Su mdico puede ayudarle Soil scientist Morton y a Scientist, forensic o Theatre manager un peso saludable. Haga ejercicio con regularidad Haga ejercicio con regularidad. Esta es una de las prcticas ms importantes que puede hacer por su salud. La mayora de los adultos deben seguir estas pautas: Optometrist, al menos, 125mnutos de actividad fsica por semana. El ejercicio debe aumentar la frecuencia cardaca y hNature conservation officertranspirar (ejercicio de intensidad moderada). Hacer ejercicios de fortalecimiento por lo mHalliburton Companypor semana. Agregue esto a su plan de ejercicio de intensidad moderada. Pasar menos tiempo sentados. Incluso la actividad fsica ligera puede ser beneficiosa. Controle sus niveles de colesterol y lpidos en la sangre Comience a realizarse anlisis de lpidos y colesterol en la sangre a los20aos y luego reptalos cada 5aos. Es posible que nAutomotive engineerlos niveles de colesterol con mayor frecuencia si: Sus niveles de lpidos y colesterol son altos. Es mayor de 40aos. Presenta un alto riesgo de padecer enfermedades cardacas. Qu debo  saber sobre las pruebas de deteccin del cncer? Muchos tipos de cncer pueden detectarse de manera temprana y, a menudo, pueden prevenirse. Segn su historia clnica y sus antecedentes familiares, es posible que deba realizarse pruebas de deteccin del cncer en diferentes edades. Esto puede incluir pruebas de deteccin de lo siguiente: CSurveyor, minerals Cncer de prstata. Cncer de piel. Cncer de pulmn. Qu debo saber sobre la enfermedad cardaca, la diabetes y la hipertensinarterial? Presin arterial y enfermedad cardaca La hipertensin arterial causa enfermedades cardacas y aSerbiael riesgo de accidente cerebrovascular. Es ms probable que esto se manifieste en las personas que tienen lecturas de presin arterial alta, tienen ascendencia africana o tienen sobrepeso. Hable con el mdico sobre sus valores de presin arterial deseados. Hgase controlar la presin arterial: Cada 3 a 5 aos si tiene entre 18 y 325aos. Todos los aos si es mayor de 4[place] Si tiene entre 643y 767aos y es fumador o sInsurance account manager pregntele al mdico si debe realizarse una prueba de deteccin de aneurisma artico abdominal (AAA) por nica vez. Diabetes Realcese exmenes de deteccin de la diabetes con regularidad. Este anlisis revisa el nivel de azcar en la sangre en aForeman Hgase las pruebas de deteccin: Cada tresaos despus de los 430aosde edad si tiene un peso normal y un bajo riesgo de padecer diabetes. Con ms frecuencia y a partir de uAynoredad inferior si tiene sobrepeso o un alto riesgo de padecer diabetes. Qu debo saber sobre la prevencin de infecciones? Hepatitis B Si tiene un riesgo ms alto de contraer hepatitis B, debe someterse a un examen de deteccin de este virus. Hable con el mdico para averiguar si tiene riesgode contraer la infeccin por hepatitis B. Hepatitis  C Se recomienda un anlisis de [place] para: Todos los que nacieron entre 1945 y [phone]. Todas las personas que  tengan un riesgo de haber contrado hepatitis C. Enfermedades de transmisin sexual (ETS) Debe realizarse pruebas de deteccin de ITS todos los aos, incluidas la gonorrea y la clamidia, si: Es sexualmente activo y es menor de 24aos. Es mayor de 24aos, y Investment banker, operational informa que corre riesgo de tener este tipo de infecciones. La actividad sexual ha cambiado desde que le hicieron la ltima prueba de deteccin y tiene un riesgo mayor de Best boy clamidia o Radio broadcast assistant. Pregntele al mdico si usted tiene riesgo. Pregntele al mdico si usted tiene un alto riesgo de Museum/gallery curator VIH. El mdico tambin puede recomendarle un medicamento recetado para ayudar a evitar la infeccin por el VIH. Si elige tomar medicamentos para prevenir el VIH, primero debe [employer] de deteccin del VIH. Luego debe hacerse anlisis cada 61mses mientras est tomando los medicamentos. Siga estas instrucciones en su casa: Estilo de vida No consuma ningn producto que contenga nicotina o tabaco, como cigarrillos, cigarrillos electrnicos y tabaco de mHigher education careers adviser Si necesita ayuda para dejar de fumar, consulte al mdico. No consuma drogas. No comparta agujas. Solicite ayuda a su mdico si necesita apoyo o informacin para abandonar las drogas. Consumo de alcohol No beba alcohol si el mdico se lo prohbe. Si bebe alcohol: Limite la cantidad que consume de 0 a 2 medidas por da. Est atento a la cantidad de alcohol que hay en las bebidas que toma. En los [place] una medida equivale a una botella de cerveza de 12oz (353m, un vaso de vino de 5oz (14873mo un vaso de una bebida alcohlica de alta graduacin de 1oz (66m19mInstrucciones generales Realcese los estudios de rutina de la salud, dentales y de la vPublic librarianntCanistotafrmele a su mdico si: Se siente deprimido con frecuencia. Alguna vez ha sido vctima de maltGodleyo se siente seguro en su casa. Resumen Adoptar un estilo  de vida saludable y recibir atencin preventiva son importantes para promover la salud y el bMusicianga las instrucciones del mdico acerca de una dieta saludable, el ejercicio y la realizacin de pruebas o exmenes para deteEngineer, building servicesga las instrucciones del mdico con respecto al control del colesterol y la presin arterial. Esta informacin no tiene comoMarine scientistconsejo del mdico. Asegresede hacerle al mdico cualquier pregunta que tenga. Document Revised: 02/08/2018 Document Reviewed: 02/08/2018 Elsevier Patient Education  2022Swift

## 2020-09-04 NOTE — Progress Notes (Signed)
[facility] [name] 60 y.o.   Chief Complaint  Patient presents with   Annual Exam    HISTORY OF PRESENT ILLNESS: This is a 60 y.o. male here for annual exam. Has history of Gout with average 1 exacerbation per year. Doing well.  Has no complaints or medical concerns today Up-to-date with colonoscopy. Needs shingles vaccine.  HPI   Prior to Admission medications   Medication Sig Start Date End Date Taking? Authorizing Provider  Colchicine 0.6 MG CAPS Take 2 capsules immediately. Then take 1 capsule two hours later. Then take 1 capsule daily for duration of flare. 07/08/20  Yes Maxine Fredman, Ines Bloomer, MD  indomethacin (INDOCIN) 50 MG capsule Take 1 capsule (50 mg total) by mouth 2 (two) times daily with a meal. 07/05/20  Yes Burky, Lanelle Bal B, NP  sildenafil (VIAGRA) 100 MG tablet Take 1 tablet (100 mg total) by mouth as needed for erectile dysfunction. 08/23/19  Yes Horald Pollen, MD    No Known Allergies  Patient Active Problem List   Diagnosis Date Noted   Obesity (BMI 30.0-34.9) 03/27/2018   Obstructive sleep apnea on CPAP 11/01/2017   Erectile dysfunction 11/01/2017   Paroxysmal atrial fibrillation ([place]) 03/03/2017    Past Medical History:  Diagnosis Date   Atrial fibrillation ([place])    a. 04/2017 s/p PVI.   Cataract    left    GERD (gastroesophageal reflux disease)    Sleep apnea    "waiting on my CPAP" (05/23/2017)    Past Surgical History:  Procedure Laterality Date   ATRIAL FIBRILLATION ABLATION N/A 05/06/2017   Procedure: ATRIAL FIBRILLATION ABLATION;  Surgeon: Constance Haw, MD;  Location: [place] [facility];  Service: Cardiovascular;  Laterality: N/A;   CARDIOVERSION N/A 03/04/2017   Procedure: CARDIOVERSION;  Surgeon: Adrian Prows, MD;  Location: [place];  Service: Cardiovascular;  Laterality: N/A;   LEFT HEART CATH AND CORONARY ANGIOGRAPHY N/A 05/24/2017   Procedure: LEFT HEART CATH AND CORONARY ANGIOGRAPHY possible angioplasty;  Surgeon:  Nigel Mormon, MD;  Location: [place] [facility];  Service: Cardiovascular;  Laterality: N/A;   WISDOM TOOTH EXTRACTION     3 removed - 1 remains    Social History   Socioeconomic History   Marital status: Married    Spouse name: Not on file   Number of children: 2   Years of education: Not on file   Highest education level: Not on file  Occupational History   Not on file  Tobacco Use   Smoking status: Former    Packs/day: 1.00    Years: 20.00    Pack years: 20.00    Types: Cigarettes    Quit date: 68    Years since quitting: 24.6   Smokeless tobacco: Never  Vaping Use   Vaping Use: Never used  Substance and Sexual Activity   Alcohol use: Yes    Alcohol/week: 3.0 standard drinks    Types: 3 Glasses of wine per week    Comment: occasionally   Drug use: No   Sexual activity: Yes  Other Topics Concern   Not on file  Social History Narrative   ** Merged History Encounter **       Social Determinants of Health   Financial Resource Strain: Not on file  Food Insecurity: Not on file  Transportation Needs: Not on file  Physical Activity: Not on file  Stress: Not on file  Social Connections: Not on file  Intimate Partner Violence: Not on file  Family History  Problem Relation Age of Onset   Diabetes Mother    Hyperlipidemia Mother    Diabetes Father    Hyperlipidemia Father    Esophageal cancer Father    Drug abuse Sister    Hyperlipidemia Maternal Grandmother    Colon cancer Neg Hx    Pancreatic cancer Neg Hx    Prostate cancer Neg Hx    Rectal cancer Neg Hx    Stomach cancer Neg Hx      Review of Systems  Constitutional: Negative.  Negative for chills and fever.  HENT: Negative.  Negative for congestion and sore throat.   Eyes: Negative.   Respiratory: Negative.  Negative for cough and shortness of breath.   Cardiovascular: Negative.  Negative for chest pain and palpitations.  Gastrointestinal:  Negative for abdominal pain, diarrhea, nausea  and vomiting.  Genitourinary: Negative.  Negative for dysuria and hematuria.  Skin: Negative.  Negative for rash.  Neurological: Negative.  Negative for dizziness and headaches.  All other systems reviewed and are negative.   Physical Exam Vitals reviewed.  Constitutional:      Appearance: Normal appearance.  HENT:     Head: Normocephalic.     Right Ear: Tympanic membrane, ear canal and external ear normal.     Left Ear: Tympanic membrane, ear canal and external ear normal.     Mouth/Throat:     Mouth: Mucous membranes are moist.     Pharynx: Oropharynx is clear.  Eyes:     Extraocular Movements: Extraocular movements intact.     Conjunctiva/sclera: Conjunctivae normal.     Pupils: Pupils are equal, round, and reactive to light.  Cardiovascular:     Rate and Rhythm: Normal rate and regular rhythm.     Pulses: Normal pulses.     Heart sounds: Normal heart sounds.  Pulmonary:     Effort: Pulmonary effort is normal.     Breath sounds: Normal breath sounds.  Abdominal:     General: Bowel sounds are normal. There is no distension.     Palpations: Abdomen is soft. There is no mass.     Tenderness: There is no abdominal tenderness.  Musculoskeletal:        General: Normal range of motion.     Cervical back: Normal range of motion and neck supple.  Skin:    General: Skin is warm and dry.     Capillary Refill: Capillary refill takes less than 2 seconds.  Neurological:     General: No focal deficit present.     Mental Status: He is alert and oriented to person, place, and time.  Psychiatric:        Mood and Affect: Mood normal.        Behavior: Behavior normal.     ASSESSMENT & PLAN: Antar was seen today for annual exam.  Diagnoses and all orders for this visit:  Routine general medical examination at a health care facility  Prostate cancer screening -     PSA(Must document that pt has been informed of limitations of PSA testing.)  Screening for deficiency anemia -      CBC with Differential  Screening for lipoid disorders -     Lipid panel  Screening for endocrine, metabolic and immunity disorder -     Hemoglobin A1c -     Comprehensive metabolic panel -     TSH  Need for shingles vaccine -     Varicella-zoster vaccine IM (Shingrix)  Other orders -  Cancel: Varicella-Zoster vaccine SQ  Modifiable risk factors discussed with patient. Anticipatory guidance according to age provided. The following topics were also discussed: Social Determinants of Health Smoking Diet and nutrition Benefits of exercise Cancer screening and review of most recent colonoscopy report Vaccinations recommendations Cardiovascular risk assessment Mental health including depression and anxiety Fall and accident prevention  Patient Instructions  Mantenimiento de Technical sales engineer en [place] Maintenance, Male Adoptar un estilo de vida saludable y recibir atencin preventiva son importantes para promover la salud y Musician. Consulte al mdico sobre: El esquema adecuado para hacerse pruebas y exmenes peridicos. Cosas que puede hacer por su cuenta para prevenir enfermedades y [place] sano. Qu debo saber sobre la dieta, el peso y el ejercicio? Consuma una dieta saludable  Consuma una dieta que incluya muchas verduras, frutas, productos lcteos con bajo contenido de [place] y Advertising account planner. No consuma muchos alimentos ricos en grasas slidas, azcares agregados o sodio.  Mantenga un peso saludable El ndice de masa muscular [facility]) es una medida que puede utilizarse para identificar posibles problemas de [place]. Proporciona una estimacin de la grasa corporal basndose en el peso y la altura. Su mdico puede ayudarle Soil scientist [place] y a Scientist, forensic o Theatre manager un peso saludable. Haga ejercicio con regularidad Haga ejercicio con regularidad. Esta es una de las prcticas ms importantes que puede hacer por su salud. [employer] de los adultos deben seguir estas  pautas: Optometrist, al menos, 150 minutos de actividad fsica por semana. El ejercicio debe aumentar la frecuencia cardaca y Nature conservation officer transpirar (ejercicio de intensidad moderada). Hacer ejercicios de fortalecimiento por lo [employer] por semana. Agregue esto a su plan de ejercicio de intensidad moderada. Pasar menos tiempo sentados. Incluso la actividad fsica ligera puede ser beneficiosa. Controle sus niveles de colesterol y lpidos en la sangre Comience a realizarse anlisis de lpidos y colesterol en la sangre a los20 aos y luego reptalos cada 5 aos. Es posible que Automotive engineer los niveles de colesterol con mayor frecuencia si: Sus niveles de lpidos y colesterol son altos. Es mayor de 9 aos. Presenta un alto riesgo de padecer enfermedades cardacas. Qu debo saber sobre las pruebas de deteccin del cncer? Muchos tipos de cncer pueden detectarse de manera temprana y, a menudo, pueden prevenirse. Segn su historia clnica y sus antecedentes familiares, es posible que deba realizarse pruebas de deteccin del cncer en diferentes edades. Esto puede incluir pruebas de deteccin de lo siguiente: Surveyor, minerals. Cncer de prstata. Cncer de piel. Cncer de pulmn. Qu debo saber sobre la enfermedad cardaca, la diabetes y la hipertensinarterial? Presin arterial y enfermedad cardaca La hipertensin arterial causa enfermedades cardacas y [place] el riesgo de accidente cerebrovascular. Es ms probable que esto se manifieste en las personas que tienen lecturas de presin arterial alta, tienen ascendencia africana o tienen sobrepeso. Hable con el mdico sobre sus valores de presin arterial deseados. Hgase controlar la presin arterial: Cada 3 a 5 aos si tiene entre 18 y 57 aos. Todos los aos si es mayor de 40 aos. Si tiene entre 68 y 85 aos y es fumador o Insurance account manager, pregntele al mdico si debe realizarse una prueba de deteccin de aneurisma artico abdominal (AAA)  por nica vez. Diabetes Realcese exmenes de deteccin de la diabetes con regularidad. Este anlisis revisa el nivel de azcar en la sangre en [place]. Hgase las pruebas de deteccin: Cada tres aos despus de los 25 aos de edad si tiene un peso normal y Advertising account planner  riesgo de padecer diabetes. Con ms frecuencia y a partir de [place] edad inferior si tiene sobrepeso o un alto riesgo de padecer diabetes. Qu debo saber sobre la prevencin de infecciones? Hepatitis B Si tiene un riesgo ms alto de contraer hepatitis B, debe someterse a un examen de deteccin de este virus. Hable con el mdico para averiguar si tiene riesgode contraer la infeccin por hepatitis B. Hepatitis C Se recomienda un anlisis de [place] para: Todos los que nacieron entre 1946 y [phone]. Todas las personas que tengan un riesgo de haber contrado hepatitis C. Enfermedades de transmisin sexual (ETS) Debe realizarse pruebas de deteccin de ITS todos los aos, incluidas la gonorrea y la clamidia, si: Es sexualmente activo y es menor de 83 aos. Es mayor de 82 aos, y Investment banker, operational informa que corre riesgo de tener este tipo de infecciones. La actividad sexual ha cambiado desde que le hicieron la ltima prueba de deteccin y tiene un riesgo mayor de Best boy clamidia o Radio broadcast assistant. Pregntele al mdico si usted tiene riesgo. Pregntele al mdico si usted tiene un alto riesgo de Museum/gallery curator VIH. El mdico tambin puede recomendarle un medicamento recetado para ayudar a evitar la infeccin por el VIH. Si elige tomar medicamentos para prevenir el VIH, primero debe [employer] de deteccin del VIH. Luego debe hacerse anlisis cada 3 meses mientras est tomando los medicamentos. Siga estas instrucciones en su casa: Estilo de vida No consuma ningn producto que contenga nicotina o tabaco, como cigarrillos, cigarrillos electrnicos y tabaco de Higher education careers adviser. Si necesita ayuda para dejar de fumar, consulte al mdico. No consuma drogas. No comparta  agujas. Solicite ayuda a su mdico si necesita apoyo o informacin para abandonar las drogas. Consumo de alcohol No beba alcohol si el mdico se lo prohbe. Si bebe alcohol: Limite la cantidad que consume de 0 a 2 medidas por da. Est atento a la cantidad de alcohol que hay en las bebidas que toma. En los [place], una medida equivale a una botella de cerveza de 12 oz (355 ml), un vaso de vino de 5 oz (148 ml) o un vaso de una bebida alcohlica de alta graduacin de 1 oz (44 ml). Instrucciones generales Realcese los estudios de rutina de la salud, dentales y de Public librarian. [place]. Infrmele a su mdico si: Se siente deprimido con frecuencia. Alguna vez ha sido vctima de [place] o no se siente seguro en su casa. Resumen Adoptar un estilo de vida saludable y recibir atencin preventiva son importantes para promover la salud y Musician. Siga las instrucciones del mdico acerca de una dieta saludable, el ejercicio y la realizacin de pruebas o exmenes para Engineer, building services. Siga las instrucciones del mdico con respecto al control del colesterol y la presin arterial. Esta informacin no tiene Marine scientist el consejo del mdico. Asegresede hacerle al mdico cualquier pregunta que tenga. Document Revised: 02/08/2018 Document Reviewed: 02/08/2018 Elsevier Patient Education  2022 [place], MD [facility]

## 2020-10-27 ENCOUNTER — Encounter: Payer: Self-pay | Admitting: Neurology

## 2020-10-29 ENCOUNTER — Ambulatory Visit: Payer: BC Managed Care – PPO | Admitting: Neurology

## 2020-10-29 ENCOUNTER — Encounter: Payer: Self-pay | Admitting: Neurology

## 2020-10-29 VITALS — BP 123/71 | HR 60 | Ht 68.0 in | Wt 234.2 lb

## 2020-10-29 DIAGNOSIS — Z9989 Dependence on other enabling machines and devices: Secondary | ICD-10-CM | POA: Diagnosis not present

## 2020-10-29 DIAGNOSIS — G4733 Obstructive sleep apnea (adult) (pediatric): Secondary | ICD-10-CM

## 2020-10-29 NOTE — Progress Notes (Signed)
Subjective:    Patient ID: [place] is a 60 y.o. male.  HPI    Interim history:   Mr. [place] is a 60 year old right-handed gentleman with an underlying medical history of atrial fibrillation, hyperlipidemia, and obesity, who presents for follow-up consultation of his obstructive sleep apnea, on CPAP therapy. The patient is accompanied by a Spanish interpreter today. I last saw him on 10/29/2019, at which time he was compliant with his CPAP.  He was using an ozone based CPAP cleaner.  He was up-to-date with his supplies.  He was recently found to have diverticulosis.  He had a checkup with his cardiologist and was deemed stable.  Today, 10/29/2020: I reviewed his CPAP compliance data from 09/28/2020 through 10/27/2020, which is a total of 30 days, during which time he used his machine every night with percent use days greater than 4 hours at 97%, indicating excellent compliance with an average usage of 6 hours and 56 minutes, residual AHI at goal at 0.3/h, leak along with a 95th percentile at 0 L/min on a pressure of 9 cm with EPR of 3.  He reports doing well with his CPAP.  He is fully compliant with it and does not skip it, with the exception that he had to skip it a couple of times in the recent past due to power outage at his house.  His wife commented that he does not snore as heavily as he used to.  He is up-to-date with his supplies but has not changed the water chamber since he got the machine some 3 years ago.  He has heard about inspire and is curious about it, we talked about this at length today.  He is wondering if he is a candidate.  He was offered a referral to ENT but declined at at this time, indicating that he has not had any difficulty tolerating his CPAP and he can continue with it for now.  He presented to the emergency room on 07/05/2020 with an acute gout flare.  I reviewed the emergency room records.  He was given a prescription for colchicine and  indomethacin.  He is no longer taking his colchicine and indomethacin, his primary care physician has not put him on allopurinol.  He did have a blood test in August 2022.   The patient's allergies, current medications, family history, past medical history, past social history, past surgical history and problem list were reviewed and updated as appropriate.    Previously:   I saw him on 10/20/2017, at which time he was compliant with his CPAP and reported feeling better.  He had a recent ablation procedure for his A. fib as well as cardioversion.  He did not have an appointment last year.   I reviewed his CPAP compliance data from 09/25/2019 through 10/24/2019, which is a total of 30 days, during which time he used his machine every night with percent use days greater than 4 hours at 100%, indicating superb compliance, average usage of 7 hours and 12 minutes, residual AHI at goal at 0.5/h, leak on the low side with a 95th percentile at 1.4 L/min, pressure of 9 cm with EPR of 3.     I first met him on 03/10/2017 at the request of his cardiologist, at which time patient reported snoring and daytime somnolence as well as breathing pauses while asleep. He was advised to proceed with a sleep study. He had a split-night sleep study on 04/21/2017. I went over his test results  with him in detail today. Sleep efficiency was 89% at baseline with a sleep latency of 8.5 minutes and REM latency of 54.5 minutes. He had a total AHI of 38.6 per hour, EKG showed PVCs and A. fib. His REM AHI was 64 per hour, supine AHI 60.6 per hour, average oxygen saturation of only 88% with a nadir of 67%, he had no significant PLMS. He was started on CPAP at 5 cm via nasal pillows and advanced 8 cm, and the final pressure his AHI was 2.2 per hour with nonsupine REM sleep achieved an O2 nadir of 89%. I suggested a home treatment pressure of 9 cm. I reviewed his CPAP compliance data from 09/19/2017 through 10/18/2017 which is a total of  30 days, during which time he used his CPAP 29 days with percent used days greater than 4 hours at 97%, indicating excellent compliance with an average usage of 6 hours and 30 minutes, residual AHI at goal at 1.4 per hour, leak acceptable on the low side with the 95th percentile at 5.2 L/m on a pressure of 9 cm with EPR of 3.      03/10/2017: (He) reports snoring and excessive daytime somnolence. I reviewed your office note from 02/13/2017, which you kindly included. His Epworth sleepiness score is 20 out of 24 today, fatigue score is 58 out of 63. He reports loud snoring and also positive in his breathing per girlfriend report. He lives alone, girlfriend lives in [place]. He has one son and one daughter. His son lives with them along with his family which includes 2 children. Patient bedtime is around 9, rise time around 4. He has to be at work at 51. 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 He has nocturia about twice per average night. He has had occasional morning headaches. He drinks caffeine in the form of coffee, usually just one cup per day, typically no sodas. He quit smoking some 20 years ago. He does drink occasional wine, 1-2 glasses, namely on weekends, not daily. Of note, the patient had a elective cardioversion on 03/04/2017. I reviewed the records. He was in sinus rhythm after his cardioversion.  His Past Medical History Is Significant For: Past Medical History:  Diagnosis Date   Atrial fibrillation ([place])    a. 04/2017 s/p PVI.   Cataract    left    GERD (gastroesophageal reflux disease)    Sleep apnea    "waiting on my CPAP" (05/23/2017)    His Past Surgical History Is Significant For: Past Surgical History:  Procedure Laterality Date   ATRIAL FIBRILLATION ABLATION N/A 05/06/2017   Procedure: ATRIAL FIBRILLATION ABLATION;  Surgeon: Constance Haw, MD;  Location: [facility];  Service: Cardiovascular;  Laterality: N/A;   CARDIOVERSION N/A 03/04/2017    Procedure: CARDIOVERSION;  Surgeon: Adrian Prows, MD;  Location: [facility];  Service: Cardiovascular;  Laterality: N/A;   LEFT HEART CATH AND CORONARY ANGIOGRAPHY N/A 05/24/2017   Procedure: LEFT HEART CATH AND CORONARY ANGIOGRAPHY possible angioplasty;  Surgeon: Nigel Mormon, MD;  Location: [facility];  Service: Cardiovascular;  Laterality: N/A;   WISDOM TOOTH EXTRACTION     3 removed - 1 remains    His Family History Is Significant For: Family History  Problem Relation Age of Onset   Diabetes Mother    Hyperlipidemia Mother    Diabetes Father    Hyperlipidemia Father    Esophageal cancer Father    Drug abuse Sister  Hyperlipidemia Maternal Grandmother    Colon cancer Neg Hx    Pancreatic cancer Neg Hx    Prostate cancer Neg Hx    Rectal cancer Neg Hx    Stomach cancer Neg Hx     His Social History Is Significant For: Social History   Socioeconomic History   Marital status: Married    Spouse name: Not on file   Number of children: 2   Years of education: Not on file   Highest education level: Not on file  Occupational History   Not on file  Tobacco Use   Smoking status: Former    Packs/day: 1.00    Years: 20.00    Pack years: 20.00    Types: Cigarettes    Quit date: 42    Years since quitting: 24.7   Smokeless tobacco: Never  Vaping Use   Vaping Use: Never used  Substance and Sexual Activity   Alcohol use: Yes    Alcohol/week: 3.0 standard drinks    Types: 3 Glasses of wine per week    Comment: occasionally   Drug use: No   Sexual activity: Yes  Other Topics Concern   Not on file  Social History Narrative   ** Merged History Encounter **       Social Determinants of Health   Financial Resource Strain: Not on file  Food Insecurity: Not on file  Transportation Needs: Not on file  Physical Activity: Not on file  Stress: Not on file  Social Connections: Not on file    His Allergies Are:  No Known Allergies:   His Current  Medications Are:  Outpatient Encounter Medications as of 10/29/2020  Medication Sig   sildenafil (VIAGRA) 100 MG tablet Take 1 tablet (100 mg total) by mouth as needed for erectile dysfunction.   Colchicine 0.6 MG CAPS Take 2 capsules immediately. Then take 1 capsule two hours later. Then take 1 capsule daily for duration of flare.   indomethacin (INDOCIN) 50 MG capsule Take 1 capsule (50 mg total) by mouth 2 (two) times daily with a meal.   No facility-administered encounter medications on file as of 10/29/2020.  :  Review of Systems:  Out of a complete 14 point review of systems, all are reviewed and negative with the exception of these symptoms as listed below:   Review of Systems  Neurological:        Pt is here for follow up visit for sleep apnea . Pt states has no questions or concerns at this time    Objective:  Neurological Exam  Physical Exam Physical Examination:   Vitals:   10/29/20 1414  BP: 123/71  Pulse: 60    General Examination: The patient is a very pleasant 60 y.o. male in no acute distress. He appears well-developed and well-nourished and well groomed.   HEENT: Normocephalic, atraumatic, pupils are equal, round and reactive to light.  Corrective eyeglasses in place. Extraocular tracking is good without limitation to gaze excursion or nystagmus noted. Normal smooth pursuit is noted. Hearing is grossly intact. Face is symmetric with normal facial animation and normal facial sensation. Speech is clear with no dysarthria noted. There is no hypophonia. There is no lip, neck/head, jaw or voice tremor. Neck is supple with full range of passive and active motion. Oropharynx exam reveals: mild mouth dryness, good dental hygiene and moderate airway crowding. Tongue protrudes centrally and palate elevates symmetrically, overall stable exam.   Chest: Clear to auscultation without wheezing, rhonchi or crackles noted.  Heart: S1+S2+0, regular, no murmurs, rubs or gallops noted.     Abdomen: Soft, non-tender and non-distended with normal bowel sounds appreciated on auscultation.   Extremities: There is trace pitting edema in the distal lower extremities bilaterally.    Skin: Warm and dry without trophic changes noted.   Musculoskeletal: exam reveals no obvious joint deformities.    Neurologically:  Mental status: The patient is awake, alert and oriented in all 4 spheres. His immediate and remote memory, attention, language skills and fund of knowledge are appropriate. There is no evidence of aphasia, agnosia, apraxia or anomia. Speech is clear with normal prosody and enunciation. Thought process is linear. Mood is normal and affect is normal.  Cranial nerves II - XII are as described above under HEENT exam.  Motor exam: Normal bulk, strength and tone is noted. There is no tremor, fine motor skills and coordination: grossly intact.  Cerebellar testing: No dysmetria or intention tremor. There is no truncal or gait ataxia.  Sensory exam: intact to light touch in the upper and lower extremities.  Gait, station and balance: He stands easily. No veering to one side is noted. No leaning to one side is noted. Posture is age-appropriate and stance is narrow based. Gait shows normal stride length and normal pace. Stable exam. Assessment and Plan:  In summary, [facility] is a very pleasant 60 year old male with an underlying medical history of atrial fibrillation, hyperlipidemia, recent gout attack and obesity, who presents for follow-up consultation of his severe obstructive sleep apnea, as confirmed by his split-night sleep study on 04/21/2017.  He has been established on CPAP therapy, he continues to be fully compliant with treatment at a pressure of 9 cm via nasal pillows.  He is typically up-to-date with his supplies.  He continues to benefit from treatment and is highly commended for his treatment adherence.  He is motivated to continue with his CPAP but we also  talked about the inspire today as he had questions about it.  We mutually agreed to have him continue on CPAP therapy.  He is advised that inspire is typically reserved for patients who are intolerant of CPAP therapy.  Technically speaking as far as his BMI and AHI scores go, he would qualify for inspire treatment.  I offered a referral to ENT for a more in-depth consultation but he declined.  He is agreeable to continuing with CPAP therapy, I placed an order for updating his supplies through his DME company.  He is advised to follow-up routinely to see one of our nurse practitioners in 1 year.  I answered all his questions today and the patient was in agreement.  I spent 30 minutes in total face-to-face time and in reviewing records during pre-charting, more than 50% of which was spent in counseling and coordination of care, reviewing test results, reviewing medications and treatment regimen and/or in discussing or reviewing the diagnosis of OSA, the prognosis and treatment options. Pertinent laboratory and imaging test results that were available during this visit with the patient were reviewed by me and considered in my medical decision making (see chart for details).

## 2020-10-29 NOTE — Patient Instructions (Signed)
It was nice to see you again today. You are fully compliant with your machine. Keep up the good work!   Please continue using your CPAP regularly. While your insurance requires that you use CPAP at least 4 hours each night on 70% of the nights, I recommend, that you not skip any nights and use it throughout the night if you can. Getting used to CPAP and staying with the treatment long term does take time and patience and discipline. Untreated obstructive sleep apnea when it is moderate to severe can have an adverse impact on cardiovascular health and raise her risk for heart disease, arrhythmias, hypertension, congestive heart failure, stroke and diabetes. Untreated obstructive sleep apnea causes sleep disruption, nonrestorative sleep, and sleep deprivation. This can have an impact on your day to day functioning and cause daytime sleepiness and impairment of cognitive function, memory loss, mood disturbance, and problems focussing. Using CPAP regularly can improve these symptoms.  We can see you in 1 year, you can see one of our nurse practitioners as you are stable.

## 2020-11-06 NOTE — Progress Notes (Signed)
CM sent  and received Denyse Amass, RN got it!  Cpap supplies.

## 2020-11-12 ENCOUNTER — Ambulatory Visit: Payer: BC Managed Care – PPO | Admitting: Internal Medicine

## 2020-11-12 ENCOUNTER — Other Ambulatory Visit: Payer: Self-pay

## 2020-11-12 ENCOUNTER — Encounter: Payer: Self-pay | Admitting: Internal Medicine

## 2020-11-12 VITALS — BP 130/76 | HR 72 | Temp 98.9°F | Ht 68.0 in | Wt 221.0 lb

## 2020-11-12 DIAGNOSIS — R42 Dizziness and giddiness: Secondary | ICD-10-CM | POA: Diagnosis not present

## 2020-11-12 DIAGNOSIS — M25522 Pain in left elbow: Secondary | ICD-10-CM

## 2020-11-12 DIAGNOSIS — R5383 Other fatigue: Secondary | ICD-10-CM | POA: Diagnosis not present

## 2020-11-12 DIAGNOSIS — M25521 Pain in right elbow: Secondary | ICD-10-CM

## 2020-11-12 DIAGNOSIS — R739 Hyperglycemia, unspecified: Secondary | ICD-10-CM

## 2020-11-12 DIAGNOSIS — I480 Paroxysmal atrial fibrillation: Secondary | ICD-10-CM

## 2020-11-12 MED ORDER — FAMOTIDINE 40 MG PO TABS: 40.0000 mg | ORAL_TABLET | Freq: Every day | ORAL | 0 refills | Status: DC

## 2020-11-12 NOTE — Patient Instructions (Addendum)
  Have blood work done here tomorrow.    An EKG was done today. It is normal.    Medications changes include :   pepcid 40 mg daily for 2 weeks.  Take tylenol as needed for headaches or joint pain.    Your prescription(s) have been submitted to your pharmacy. Please take as directed and contact our office if you believe you are having problem(s) with the medication(s).   Please call if there is no improvement in your symptoms.

## 2020-11-12 NOTE — Progress Notes (Signed)
Subjective:    Patient ID: [facility], male    DOB: 02-02-1961, 60 y.o.   MRN: [id number]  This visit occurred during the SARS-CoV-2 public health emergency.  Safety protocols were in place, including screening questions prior to the visit, additional usage of staff PPE, and extensive cleaning of exam room while observing appropriate contact time as indicated for disinfecting solutions.    HPI The patient is here for an acute visit.   Fatigue, weakness, dizziness -   he states his symptoms started 5 or 6 days ago.  He states pain in his bilateral elbows and wrists, pain in the posterior aspect of his head, dizziness and fatigue, epigastric pain and nausea.  He had 2 episodes of left-sided chest pain that lasted about 5 minutes.  They occurred with normal daily activities.  He had 1 episode of palpitations that made him think of the atrial fibrillation he has had in the past.  His symptoms are intermittent.  He states the joint pain and posterior head pain and dizziness come together.  He is not sure why he is more fatigued.  He uses his cpap 6 hours a night.  He drinks a lot of fluids.  He is eating well.  Sometimes eating can upset his stomach.  He denies reflux.  No one else is sick at home.    He does urinate a lot, but drinks sometimes a gallon of water a day.  There has been no changes.  CT coronary artery 05/2017 - calcium score of 0     Medications and allergies reviewed with patient and updated if appropriate.  Patient Active Problem List   Diagnosis Date Noted   Obesity (BMI 30.0-34.9) 03/27/2018   Obstructive sleep apnea on CPAP 11/01/2017   Erectile dysfunction 11/01/2017   Paroxysmal atrial fibrillation ([place]) 03/03/2017    Current Outpatient Medications on File Prior to Visit  Medication Sig Dispense Refill   Colchicine 0.6 MG CAPS Take 2 capsules immediately. Then take 1 capsule two hours later. Then take 1 capsule daily for duration of flare.  10 capsule 0   indomethacin (INDOCIN) 50 MG capsule Take 1 capsule (50 mg total) by mouth 2 (two) times daily with a meal. 10 capsule 0   sildenafil (VIAGRA) 100 MG tablet Take 1 tablet (100 mg total) by mouth as needed for erectile dysfunction. 6 tablet 19   No current facility-administered medications on file prior to visit.    Past Medical History:  Diagnosis Date   Atrial fibrillation ([place])    a. 04/2017 s/p PVI.   Cataract    left    GERD (gastroesophageal reflux disease)    Sleep apnea    "waiting on my CPAP" (05/23/2017)    Past Surgical History:  Procedure Laterality Date   ATRIAL FIBRILLATION ABLATION N/A 05/06/2017   Procedure: ATRIAL FIBRILLATION ABLATION;  Surgeon: Constance Haw, MD;  Location: [facility];  Service: Cardiovascular;  Laterality: N/A;   CARDIOVERSION N/A 03/04/2017   Procedure: CARDIOVERSION;  Surgeon: Adrian Prows, MD;  Location: [facility];  Service: Cardiovascular;  Laterality: N/A;   LEFT HEART CATH AND CORONARY ANGIOGRAPHY N/A 05/24/2017   Procedure: LEFT HEART CATH AND CORONARY ANGIOGRAPHY possible angioplasty;  Surgeon: Nigel Mormon, MD;  Location: [facility];  Service: Cardiovascular;  Laterality: N/A;   WISDOM TOOTH EXTRACTION     3 removed - 1 remains    Social History   Socioeconomic History   Marital status:  Married    Spouse name: Not on file   Number of children: 2   Years of education: Not on file   Highest education level: Not on file  Occupational History   Not on file  Tobacco Use   Smoking status: Former    Packs/day: 1.00    Years: 20.00    Pack years: 20.00    Types: Cigarettes    Quit date: 25    Years since quitting: 24.7   Smokeless tobacco: Never  Vaping Use   Vaping Use: Never used  Substance and Sexual Activity   Alcohol use: Yes    Alcohol/week: 3.0 standard drinks    Types: 3 Glasses of wine per week    Comment: occasionally   Drug use: No   Sexual activity: Yes  Other Topics  Concern   Not on file  Social History Narrative   ** Merged History Encounter **       Social Determinants of Health   Financial Resource Strain: Not on file  Food Insecurity: Not on file  Transportation Needs: Not on file  Physical Activity: Not on file  Stress: Not on file  Social Connections: Not on file    Family History  Problem Relation Age of Onset   Diabetes Mother    Hyperlipidemia Mother    Diabetes Father    Hyperlipidemia Father    Esophageal cancer Father    Drug abuse Sister    Hyperlipidemia Maternal Grandmother    Colon cancer Neg Hx    Pancreatic cancer Neg Hx    Prostate cancer Neg Hx    Rectal cancer Neg Hx    Stomach cancer Neg Hx     Review of Systems  Constitutional:  Positive for fatigue. Negative for appetite change and fever.  HENT:         Dry mouth sometimes  Respiratory:  Negative for cough and shortness of breath.   Cardiovascular:  Positive for chest pain (sometimes in left side) and palpitations. Negative for leg swelling.  Gastrointestinal:  Positive for abdominal pain (epigastric region) and nausea. Negative for blood in stool (no melena), constipation and diarrhea.       No gerd  Endocrine: Positive for cold intolerance.  Genitourinary:  Positive for frequency (Related to drinking a lot of fluids).  Musculoskeletal:  Positive for arthralgias (b/l elbows and wrists).  Neurological:  Positive for dizziness and headaches (posterior  head). Negative for light-headedness.      Objective:   Vitals:   11/12/20 1543  BP: 130/76  Pulse: 72  Temp: 98.9 F (37.2 C)  SpO2: 97%   BP Readings from Last 3 Encounters:  11/12/20 130/76  10/29/20 123/71  09/04/20 116/60   Wt Readings from Last 3 Encounters:  11/12/20 221 lb (100.2 kg)  10/29/20 234 lb 3.2 oz (106.2 kg)  09/04/20 231 lb (104.8 kg)   Body mass index is 33.6 kg/m.   Physical Exam Constitutional:      General: He is not in acute distress.    Appearance: He is  well-developed. He is not ill-appearing.  HENT:     Head: Normocephalic and atraumatic.  Cardiovascular:     Rate and Rhythm: Normal rate and regular rhythm.     Heart sounds: No murmur heard. Pulmonary:     Effort: Pulmonary effort is normal. No respiratory distress.     Breath sounds: No wheezing or rales.  Abdominal:     General: Abdomen is protuberant.  Palpations: Abdomen is soft.     Tenderness: There is no abdominal tenderness.  Musculoskeletal:     Comments: No tenderness or swelling of b/l elbows or wrists.  FROM  Skin:    General: Skin is warm and dry.  Neurological:     Mental Status: He is alert.           Assessment & Plan:    Paroxysmal atrial fibrillation: He did see cardiology last year and had maintained sinus rhythm for a couple of years and was advised to follow-up as needed Since then he has had 1 episode of palpitations yesterday-?  A. fib He denies other episodes EKG here today: Normal sinus rhythm 68 bpm, early transition in V1.  Compared to last EKG from 09/2019 there are no differences  Left-sided chest pain: He had 2 episodes that were transient and lasting maybe 5 minutes that occurred with daily activities Coronary calcium score from 2019 was 0 and no evidence of CAD so I do not think this is cardiac in nature EKG as above Possibly GI related  Epigastric pain, nausea: Denies GERD, but symptoms worse with eating and he could have some reflux that he is unaware of Start Pepcid 40 mg daily x2 weeks-if symptoms improve may need to continue this or evaluate further  Dizziness, posterior headaches, bilateral elbow and wrist pain: No headache currently.  Bilateral elbows/wrists without swelling and nontender and with full range of motion Dizziness is intermittent The symptoms are not related to the palpitations or other symptoms and I am unsure what the causes He drinks plenty of water, but states frequent urination and dry mouth Blood work  reviewed from 2 months ago Will recheck A1c, CBC, CMP Advised that he can take Tylenol as needed  Depending on blood work results and symptoms over the next couple of days will determine further work-up

## 2020-11-13 ENCOUNTER — Other Ambulatory Visit (INDEPENDENT_AMBULATORY_CARE_PROVIDER_SITE_OTHER): Payer: BC Managed Care – PPO

## 2020-11-13 DIAGNOSIS — M25521 Pain in right elbow: Secondary | ICD-10-CM

## 2020-11-13 DIAGNOSIS — R739 Hyperglycemia, unspecified: Secondary | ICD-10-CM | POA: Diagnosis not present

## 2020-11-13 DIAGNOSIS — R42 Dizziness and giddiness: Secondary | ICD-10-CM

## 2020-11-13 DIAGNOSIS — R5383 Other fatigue: Secondary | ICD-10-CM

## 2020-11-13 DIAGNOSIS — I480 Paroxysmal atrial fibrillation: Secondary | ICD-10-CM

## 2020-11-13 DIAGNOSIS — M25522 Pain in left elbow: Secondary | ICD-10-CM

## 2020-11-13 LAB — COMPREHENSIVE METABOLIC PANEL
ALT: 30 U/L (ref 0–53)
AST: 26 U/L (ref 0–37)
Albumin: 4.1 g/dL (ref 3.5–5.2)
Alkaline Phosphatase: 54 U/L (ref 39–117)
BUN: 15 mg/dL (ref 6–23)
CO2: 30 mEq/L (ref 19–32)
Calcium: 9.4 mg/dL (ref 8.4–10.5)
Chloride: 104 mEq/L (ref 96–112)
Creatinine, Ser: 1.27 mg/dL (ref 0.40–1.50)
GFR: 61.07 mL/min (ref >60.00)
Glucose, Bld: 96 mg/dL (ref 70–99)
Potassium: 4.1 mEq/L (ref 3.5–5.1)
Sodium: 140 mEq/L (ref 135–145)
Total Bilirubin: 0.4 mg/dL (ref 0.2–1.2)
Total Protein: 8 g/dL (ref 6.0–8.3)

## 2020-11-13 LAB — CBC WITH DIFFERENTIAL/PLATELET
Basophils Absolute: 0.1 10*3/uL (ref 0.0–0.1)
Basophils Relative: 0.7 % (ref 0.0–3.0)
Eosinophils Absolute: 0.1 10*3/uL (ref 0.0–0.7)
Eosinophils Relative: 0.7 % (ref 0.0–5.0)
HCT: 44.3 % (ref 39.0–52.0)
Hemoglobin: 14.5 g/dL (ref 13.0–17.0)
Lymphocytes Relative: 29.3 % (ref 12.0–46.0)
Lymphs Abs: 2.8 10*3/uL (ref 0.7–4.0)
MCHC: 32.8 g/dL (ref 30.0–36.0)
MCV: 102.9 fl — ABNORMAL HIGH (ref 78.0–100.0)
Monocytes Absolute: 0.9 10*3/uL (ref 0.1–1.0)
Monocytes Relative: 9.8 % (ref 3.0–12.0)
Neutro Abs: 5.6 10*3/uL (ref 1.4–7.7)
Neutrophils Relative %: 59.5 % (ref 43.0–77.0)
Platelets: 232 10*3/uL (ref 150.0–400.0)
RBC: 4.3 Mil/uL (ref 4.22–5.81)
RDW: 13.7 % (ref 11.5–15.5)
WBC: 9.5 10*3/uL (ref 4.0–10.5)

## 2020-11-13 LAB — TSH: TSH: 2.62 u[IU]/mL (ref 0.35–5.50)

## 2020-11-13 LAB — HEMOGLOBIN A1C: Hgb A1c MFr Bld: 6 % (ref 4.6–6.5)

## 2021-02-02 ENCOUNTER — Other Ambulatory Visit: Payer: Self-pay

## 2021-02-02 ENCOUNTER — Ambulatory Visit
Admission: EM | Admit: 2021-02-02 | Discharge: 2021-02-02 | Disposition: A | Discharge status: Home / Self Care | Payer: BC Managed Care – PPO | Attending: Physician Assistant | Admitting: Physician Assistant

## 2021-02-02 DIAGNOSIS — M79671 Pain in right foot: Secondary | ICD-10-CM | POA: Diagnosis not present

## 2021-02-02 DIAGNOSIS — M109 Gout, unspecified: Secondary | ICD-10-CM | POA: Diagnosis not present

## 2021-02-02 MED ORDER — HYDROCODONE-ACETAMINOPHEN 5-325 MG PO TABS: 1.0000 | ORAL_TABLET | Freq: Three times a day (TID) | ORAL | 0 refills | Status: AC | PRN

## 2021-02-02 MED ORDER — INDOMETHACIN 50 MG PO CAPS: 50.0000 mg | ORAL_CAPSULE | Freq: Two times a day (BID) | ORAL | 1 refills | Status: AC

## 2021-02-02 MED ORDER — COLCHICINE 0.6 MG PO CAPS: ORAL_CAPSULE | ORAL | 1 refills | Status: DC

## 2021-02-02 NOTE — ED Triage Notes (Signed)
Pt states that he had Gout in his right foot a few years ago. Pt states that his pain started again 5 days ago.

## 2021-02-02 NOTE — ED Provider Notes (Signed)
[facility]    CSN: [id number] Arrival date & time: 02/02/21  0859      History   Chief Complaint Chief Complaint  Patient presents with   Gout    HPI [facility] [place] is a 61 y.o. male presenting for 6-day history of 5 to 6-day history of increased pain, redness and swelling of the right first MTP joint.  Patient reports history of gout and says symptoms are consistent with gout.  Reports about a flareup per year.  Normally receives colchicine and indomethacin.  Patient denies any injury.  Reports recent travel and says this always happens after he travels.  He has taken over-the-counter Tylenol without improvement in symptoms.  No associated fever or weakness.  No other complaints.  HPI  Past Medical History:  Diagnosis Date   Atrial fibrillation ([place])    a. 04/2017 s/p PVI.   Cataract    left    GERD (gastroesophageal reflux disease)    Sleep apnea    "waiting on my CPAP" (05/23/2017)    Patient Active Problem List   Diagnosis Date Noted   Obesity (BMI 30.0-34.9) 03/27/2018   Obstructive sleep apnea on CPAP 11/01/2017   Erectile dysfunction 11/01/2017   Paroxysmal atrial fibrillation ([place]) 03/03/2017    Past Surgical History:  Procedure Laterality Date   ATRIAL FIBRILLATION ABLATION N/A 05/06/2017   Procedure: ATRIAL FIBRILLATION ABLATION;  Surgeon: Constance Haw, MD;  Location: [place] [facility];  Service: Cardiovascular;  Laterality: N/A;   CARDIOVERSION N/A 03/04/2017   Procedure: CARDIOVERSION;  Surgeon: Adrian Prows, MD;  Location: [place];  Service: Cardiovascular;  Laterality: N/A;   LEFT HEART CATH AND CORONARY ANGIOGRAPHY N/A 05/24/2017   Procedure: LEFT HEART CATH AND CORONARY ANGIOGRAPHY possible angioplasty;  Surgeon: Nigel Mormon, MD;  Location: [place] [facility];  Service: Cardiovascular;  Laterality: N/A;   WISDOM TOOTH EXTRACTION     3 removed - 1 remains       Home Medications    Prior to Admission  medications   Medication Sig Start Date End Date Taking? Authorizing Provider  famotidine (PEPCID) 40 MG tablet Take 1 tablet (40 mg total) by mouth daily. 11/12/20  Yes Burns, Claudina Lick, MD  HYDROcodone-acetaminophen (NORCO/VICODIN) 5-325 MG tablet Take 1 tablet by mouth every 8 (eight) hours as needed for up to 2 days. 02/02/21 02/04/21 Yes Danton Clap, PA-C  sildenafil (VIAGRA) 100 MG tablet Take 1 tablet (100 mg total) by mouth as needed for erectile dysfunction. 08/23/19  Yes Sagardia, Ines Bloomer, MD  Colchicine 0.6 MG CAPS Take 2 capsules immediately. Then take 1 capsule two hours later. Then take 1 capsule daily for duration of flare. 02/02/21   Danton Clap, PA-C  indomethacin (INDOCIN) 50 MG capsule Take 1 capsule (50 mg total) by mouth 2 (two) times daily with a meal for 7 days. 02/02/21 02/09/21  Danton Clap, PA-C    Family History Family History  Problem Relation Age of Onset   Diabetes Mother    Hyperlipidemia Mother    Diabetes Father    Hyperlipidemia Father    Esophageal cancer Father    Drug abuse Sister    Hyperlipidemia Maternal Grandmother    Colon cancer Neg Hx    Pancreatic cancer Neg Hx    Prostate cancer Neg Hx    Rectal cancer Neg Hx    Stomach cancer Neg Hx     Social History Social History   Tobacco Use  Smoking status: Former    Packs/day: 1.00    Years: 20.00    Pack years: 20.00    Types: Cigarettes    Quit date: 1998    Years since quitting: 25.0   Smokeless tobacco: Never  Vaping Use   Vaping Use: Never used  Substance Use Topics   Alcohol use: Yes    Alcohol/week: 3.0 standard drinks    Types: 3 Glasses of wine per week    Comment: occasionally   Drug use: No     Allergies   Patient has no known allergies.   Review of Systems Review of Systems  Constitutional:  Negative for fatigue and fever.  Musculoskeletal:  Positive for arthralgias and joint swelling. Negative for gait problem.  Skin:  Positive for color change. Negative  for rash and wound.  Neurological:  Negative for weakness.    Physical Exam Triage Vital Signs ED Triage Vitals  Enc Vitals Group     BP --      Pulse --      Resp --      Temp --      Temp src --      SpO2 --      Weight 02/02/21 0927 225 lb (102.1 kg)     Height 02/02/21 0927 5\' 6"  (1.676 m)     Head Circumference --      Peak Flow --      Pain Score 02/02/21 0926 8     Pain Loc --      Pain Edu? --      Excl. in [place]? --    No data found.  Updated Vital Signs BP (!) 144/57 (BP Location: Left Arm)    Pulse 64    Temp 98.2 F (36.8 C) (Oral)    Resp 18    Ht 5\' 6"  (1.676 m)    Wt 225 lb (102.1 kg)    SpO2 99%    BMI 36.32 kg/m      Physical Exam Vitals and nursing note reviewed.  Constitutional:      General: He is not in acute distress.    Appearance: Normal appearance. He is well-developed. He is not ill-appearing.  HENT:     Head: Normocephalic and atraumatic.  Eyes:     General: No scleral icterus.    Conjunctiva/sclera: Conjunctivae normal.  Cardiovascular:     Rate and Rhythm: Normal rate and regular rhythm.     Pulses: Normal pulses.  Pulmonary:     Effort: Pulmonary effort is normal. No respiratory distress.  Musculoskeletal:     Cervical back: Neck supple.     Comments: RIGHT FOOT: There is moderate swelling, erythema and warmth of the right first MTP joint.  Area is tender to palpation significantly.  Good strength and sensation.  Increased pain with movement of the great toe.  Skin:    General: Skin is warm and dry.     Capillary Refill: Capillary refill takes less than 2 seconds.  Neurological:     General: No focal deficit present.     Mental Status: He is alert. Mental status is at baseline.     Motor: No weakness.     Coordination: Coordination normal.     Gait: Gait normal.  Psychiatric:        Mood and Affect: Mood normal.        Behavior: Behavior normal.        Thought Content: Thought content normal.  UC Treatments / Results   Labs (all labs ordered are listed, but only abnormal results are displayed) Labs Reviewed - No data to display  EKG   Radiology No results found.  Procedures Procedures (including critical care time)  Medications Ordered in UC Medications - No data to display  Initial Impression / Assessment and Plan / UC Course  I have reviewed the triage vital signs and the nursing notes.  Pertinent labs & imaging results that were available during my care of the patient were reviewed by me and considered in my medical decision making (see chart for details).  61 year old male presenting for right first MTP joint pain, swelling and redness for the past 5 to 6 days.  Reports history of gout.  Patient's clinical presentation is consistent with a gout flareup.  Reviewed RICE guidelines with patient.  Avoid triggers.  We will treat with colchicine, indomethacin and also gave patient a short supply of hydrocodone after reviewing the controlled substance database and finding anterior be low risk for abuse.  Advised following up with PCP if not improving or for any worsening of symptoms over the next week.   Final Clinical Impressions(s) / UC Diagnoses   Final diagnoses:  Acute gout of right foot, unspecified cause  Right foot pain   Discharge Instructions   None    ED Prescriptions     Medication Sig Dispense Auth. Provider   Colchicine 0.6 MG CAPS Take 2 capsules immediately. Then take 1 capsule two hours later. Then take 1 capsule daily for duration of flare. 15 capsule Laurene Footman B, PA-C   indomethacin (INDOCIN) 50 MG capsule Take 1 capsule (50 mg total) by mouth 2 (two) times daily with a meal for 7 days. 14 capsule Laurene Footman B, PA-C   HYDROcodone-acetaminophen (NORCO/VICODIN) 5-325 MG tablet Take 1 tablet by mouth every 8 (eight) hours as needed for up to 2 days. 6 tablet Danton Clap, PA-C      I have reviewed the PDMP during this encounter.   Danton Clap, PA-C 02/02/21  1013

## 2021-02-03 ENCOUNTER — Telehealth: Payer: Self-pay | Admitting: Emergency Medicine

## 2021-02-03 NOTE — Telephone Encounter (Signed)
Connected to [facility] 1.2.2023.   Caller states he is having toe pain. He has gout.

## 2021-02-04 NOTE — Telephone Encounter (Signed)
Called and spoke with pt, he states that he went to [facility] for gout symptoms.

## 2021-02-19 ENCOUNTER — Telehealth: Payer: Self-pay | Admitting: Emergency Medicine

## 2021-02-19 NOTE — Telephone Encounter (Signed)
1.Medication Requested: Colchicine 0.6 mg  2. Pharmacy ([facility]): [facility]  3. On Med List: yes  4. Last Visit with PCP: 08.04.22  5. Next visit date with PCP: 08.07.2023   Agent: Please be advised that RX refills may take up to 3 business days. We ask that you follow-up with your pharmacy.

## 2021-02-20 ENCOUNTER — Encounter: Payer: Self-pay | Admitting: Nurse Practitioner

## 2021-02-20 ENCOUNTER — Ambulatory Visit: Payer: BC Managed Care – PPO | Admitting: Nurse Practitioner

## 2021-02-20 ENCOUNTER — Other Ambulatory Visit: Payer: Self-pay

## 2021-02-20 VITALS — BP 128/78 | HR 76 | Temp 98.8°F | Ht 66.0 in | Wt 230.0 lb

## 2021-02-20 DIAGNOSIS — M2550 Pain in unspecified joint: Secondary | ICD-10-CM | POA: Diagnosis not present

## 2021-02-20 DIAGNOSIS — M109 Gout, unspecified: Secondary | ICD-10-CM

## 2021-02-20 LAB — COMPREHENSIVE METABOLIC PANEL
ALT: 32 U/L (ref 0–53)
AST: 27 U/L (ref 0–37)
Albumin: 4 g/dL (ref 3.5–5.2)
Alkaline Phosphatase: 50 U/L (ref 39–117)
BUN: 13 mg/dL (ref 6–23)
CO2: 25 mEq/L (ref 19–32)
Calcium: 8.9 mg/dL (ref 8.4–10.5)
Chloride: 105 mEq/L (ref 96–112)
Creatinine, Ser: 1.21 mg/dL (ref 0.40–1.50)
GFR: 64.59 mL/min (ref >60.00)
Glucose, Bld: 95 mg/dL (ref 70–99)
Potassium: 3.7 mEq/L (ref 3.5–5.1)
Sodium: 138 mEq/L (ref 135–145)
Total Bilirubin: 1.4 mg/dL — ABNORMAL HIGH (ref 0.2–1.2)
Total Protein: 7.6 g/dL (ref 6.0–8.3)

## 2021-02-20 LAB — CBC WITH DIFFERENTIAL/PLATELET
Basophils Absolute: 0.1 10*3/uL (ref 0.0–0.1)
Basophils Relative: 0.6 % (ref 0.0–3.0)
Eosinophils Absolute: 0 10*3/uL (ref 0.0–0.7)
Eosinophils Relative: 0.3 % (ref 0.0–5.0)
HCT: 42.9 % (ref 39.0–52.0)
Hemoglobin: 14.3 g/dL (ref 13.0–17.0)
Lymphocytes Relative: 23.5 % (ref 12.0–46.0)
Lymphs Abs: 2.5 10*3/uL (ref 0.7–4.0)
MCHC: 33.2 g/dL (ref 30.0–36.0)
MCV: 101.1 fl — ABNORMAL HIGH (ref 78.0–100.0)
Monocytes Absolute: 1 10*3/uL (ref 0.1–1.0)
Monocytes Relative: 9.6 % (ref 3.0–12.0)
Neutro Abs: 6.9 10*3/uL (ref 1.4–7.7)
Neutrophils Relative %: 66 % (ref 43.0–77.0)
Platelets: 213 10*3/uL (ref 150.0–400.0)
RBC: 4.24 Mil/uL (ref 4.22–5.81)
RDW: 14.1 % (ref 11.5–15.5)
WBC: 10.4 10*3/uL (ref 4.0–10.5)

## 2021-02-20 LAB — URIC ACID: Uric Acid, Serum: 7.6 mg/dL (ref 4.0–7.8)

## 2021-02-20 MED ORDER — INDOMETHACIN 50 MG PO CAPS: 50.0000 mg | ORAL_CAPSULE | Freq: Three times a day (TID) | ORAL | 0 refills | Status: DC

## 2021-02-20 MED ORDER — COLCHICINE 0.6 MG PO CAPS: ORAL_CAPSULE | ORAL | 0 refills | Status: DC

## 2021-02-20 NOTE — Telephone Encounter (Signed)
Medication refilled 02/19/21, by Jeralyn Ruths.

## 2021-02-20 NOTE — Progress Notes (Signed)
° ° ° °Subjective:  °Patient ID: Evan Page, male    DOB: 08/09/1960  Age: 61 y.o. MRN: [id number] ° °CC:  °Chief Complaint  °Patient presents with  ° Gout  °  Flare up in right foot and leg.   °  ° ° °HPI  °This patient arrives today for the above. °He believes he is having a gout flare.  He was seen in the emergency department a little over 2 weeks ago for similar symptoms.  Today, he tells me he is having pain in his right great toe joint as well in his bilateral hands CMC joints.  He did take colchicine and indomethacin after being seen in the ER with resolution in his symptoms.  He tells me that the pain started again yesterday.  He reports the pain as an 8 out of 10 in intensity and it is constant. ° °Past Medical History:  °Diagnosis Date  ° Atrial fibrillation ([facility])   ° a. 04/2017 s/p PVI.  ° Cataract   ° left   ° GERD (gastroesophageal reflux disease)   ° Sleep apnea   ° "waiting on my CPAP" (05/23/2017)  ° ° ° ° °Family History  °Problem Relation Age of Onset  ° Diabetes Mother   ° Hyperlipidemia Mother   ° Diabetes Father   ° Hyperlipidemia Father   ° Esophageal cancer Father   ° Drug abuse Sister   ° Hyperlipidemia Maternal Grandmother   ° Colon cancer Neg Hx   ° Pancreatic cancer Neg Hx   ° Prostate cancer Neg Hx   ° Rectal cancer Neg Hx   ° Stomach cancer Neg Hx   ° ° °Social History  ° °Social History Narrative  ° ** Merged History Encounter **  °    ° °Social History  ° °Tobacco Use  ° Smoking status: Former  °  Packs/day: 1.00  °  Years: 20.00  °  Pack years: 20.00  °  Types: Cigarettes  °  Quit date: 1998  °  Years since quitting: 25.0  ° Smokeless tobacco: Never  °Substance Use Topics  ° Alcohol use: Yes  °  Alcohol/week: 3.0 standard drinks  °  Types: 3 Glasses of wine per week  °  Comment: occasionally  ° ° ° °Current Meds  °Medication Sig  ° famotidine (PEPCID) 40 MG tablet Take 1 tablet (40 mg total) by mouth daily.  ° indomethacin (INDOCIN) 50 MG capsule Take 1 capsule (50 mg  total) by mouth 3 (three) times daily with meals.  ° sildenafil (VIAGRA) 100 MG tablet Take 1 tablet (100 mg total) by mouth as needed for erectile dysfunction.  ° [DISCONTINUED] Colchicine 0.6 MG CAPS Take 2 capsules immediately. Then take 1 capsule two hours later. Then take 1 capsule daily for duration of flare.  ° ° °ROS:  °Review of Systems  °Musculoskeletal:  Positive for joint pain (Righ great toe, biltateral CMC).  ° ° °Objective:  ° °Today's Vitals: BP 128/78    Pulse 76    Temp 98.8 °F (37.1 °C) (Oral)    Ht 5' 6" (1.676 m)    Wt 230 lb (104.3 kg)    SpO2 95%    BMI 37.12 kg/m²  °Vitals with BMI 02/20/2021 02/02/2021 11/12/2020  °Height 5' 6" 5' 6" 5' 8"  °Weight 230 lbs 225 lbs 221 lbs  °BMI 37.14 36.33 33.61  °Systolic 128 144 130  °Diastolic 78 57 76  °Pulse 76 64 72  °  ° °  Physical Exam Vitals reviewed.  Constitutional:      Appearance: Normal appearance.  HENT:     Head: Normocephalic and atraumatic.  Cardiovascular:     Rate and Rhythm: Normal rate and regular rhythm.  Pulmonary:     Effort: Pulmonary effort is normal.     Breath sounds: Normal breath sounds.  Musculoskeletal:       Arms:     Cervical back: Neck supple.       Feet:  Skin:    General: Skin is warm and dry.  Neurological:     Mental Status: He is alert and oriented to person, place, and time.  Psychiatric:        Mood and Affect: Mood normal.        Behavior: Behavior normal.        Thought Content: Thought content normal.        Judgment: Judgment normal.         Assessment and Plan   1. Arthralgia, unspecified joint   2. Acute gouty arthritis      Plan: 1.,  2.  We will treat this as gout flare again.  We will order colchicine as well as indomethacin.  I will also order some lab work including uric acid and labs to check for rheumatoid arthritis.  Further recommendations may be made based upon these results.  Patient will follow-up in a few weeks for close monitoring, or sooner as  needed.   Tests ordered Orders Placed This Encounter  Procedures   Uric acid   Comp Met (CMET)   Cyclic citrul peptide antibody, IgG (QUEST)   Rheumatoid Factor   Antinuclear Antib (ANA)   CBC with Differential/Platelet      Meds ordered this encounter  Medications   Colchicine 0.6 MG CAPS    Sig: Take 1 capsules immediately. Then take 1 capsule two hours later. Then take 1 capsule daily until pain has resolved.    Dispense:  10 capsule    Refill:  0    Order Specific Question:   Supervising Provider    Answer:   BURNS, Claudina Lick [[id number]]   indomethacin (INDOCIN) 50 MG capsule    Sig: Take 1 capsule (50 mg total) by mouth 3 (three) times daily with meals.    Dispense:  9 capsule    Refill:  0    Order Specific Question:   Supervising Provider    Answer:   Binnie Rail [id number]    Patient to follow-up in approximately 3 weeks for close monitoring of symptoms.  [name], NP

## 2021-02-23 LAB — RHEUMATOID FACTOR: Rheumatoid fact SerPl-aCnc: <14 IU/mL (ref <14)

## 2021-02-23 LAB — CYCLIC CITRUL PEPTIDE ANTIBODY, IGG: Cyclic Citrullin Peptide Ab: <16 UNITS

## 2021-02-23 LAB — ANA: Anti Nuclear Antibody (ANA): NEGATIVE

## 2021-03-18 IMAGING — CT CT ABD-PELV W/ CM
2 of 5 series · 16 of 46 positions shown, 18 images · IV contrast (Omnipaque)
Comparison: The patient's prior CT from 6777 could not be retrieved
for comparison at the time of this dictation.

CLINICAL DATA: Diverticulitis suspected. Left-sided abdominal pain.

EXAM:
CT ABDOMEN AND PELVIS WITH CONTRAST
TECHNIQUE: Multidetector CT imaging of the abdomen and pelvis was performed
using the standard protocol following bolus administration of
intravenous contrast.
CONTRAST:  100mL OMNIPAQUE IOHEXOL 300 MG/ML  SOLN

[Series 2: axial st · axial · 0.81mm/px · z∈[-621,-171]mm · 13 of 101 slices shown, 15 images]
[im 6/101  soft-tissue]
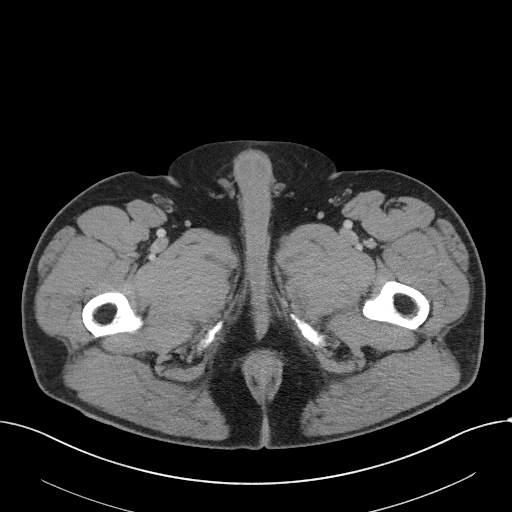
[im 6/101  bone]
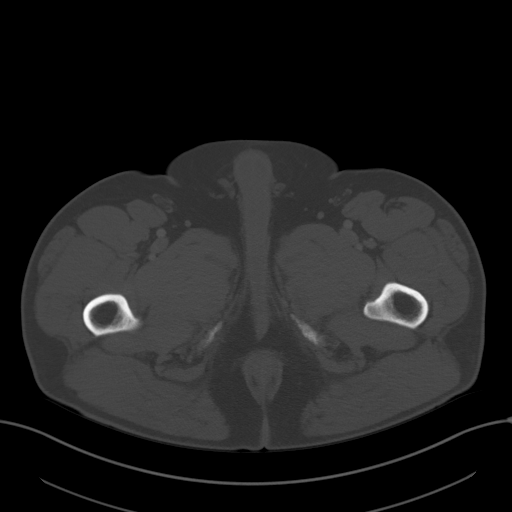
[im 16/101  soft-tissue]
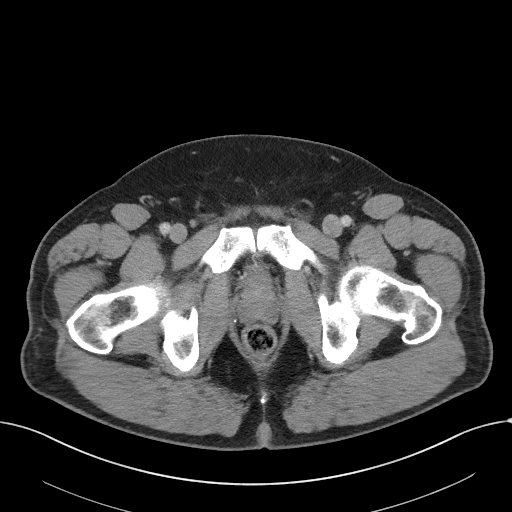
[im 21/101  soft-tissue]
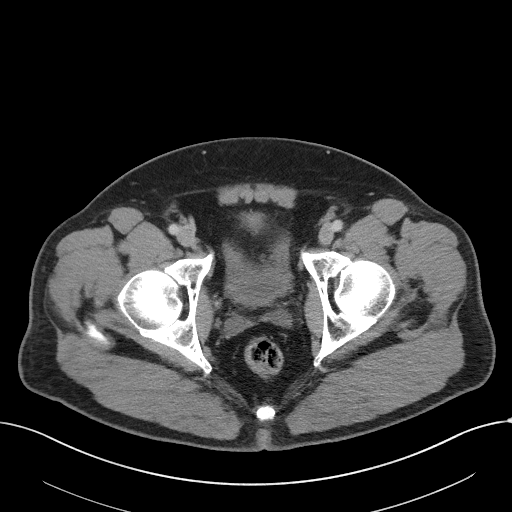
[im 31/101  soft-tissue]
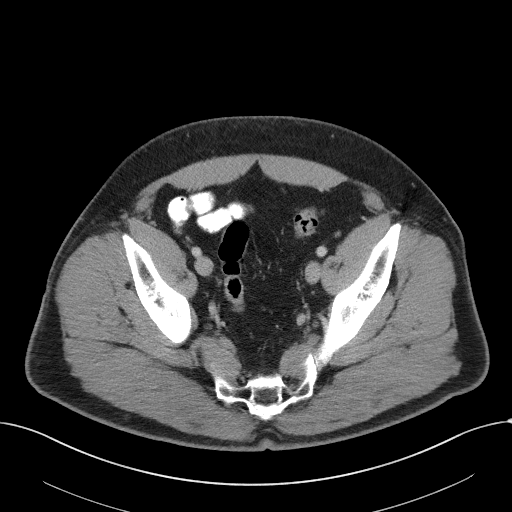
[im 36/101  soft-tissue]
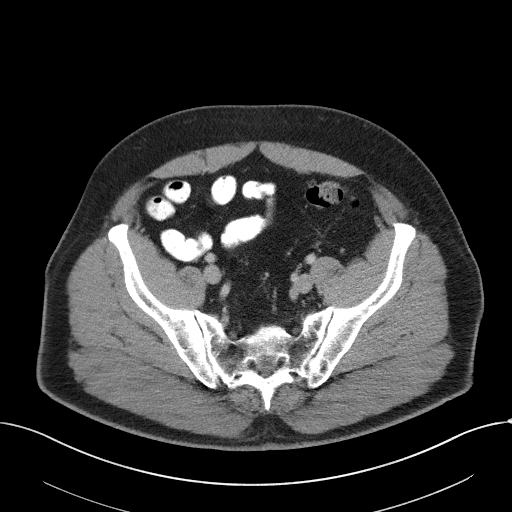
[im 46/101  soft-tissue]
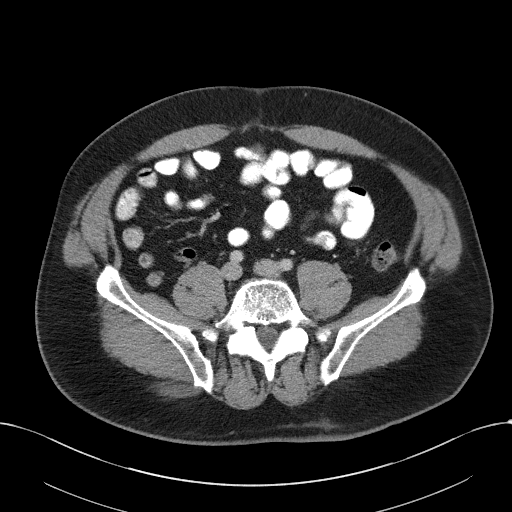
[im 51/101  soft-tissue]
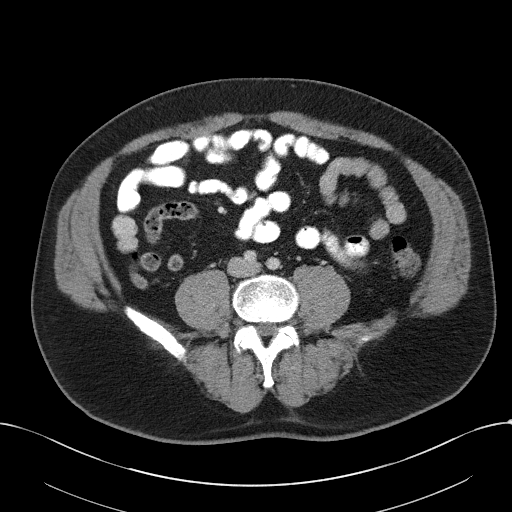
[im 56/101  soft-tissue]
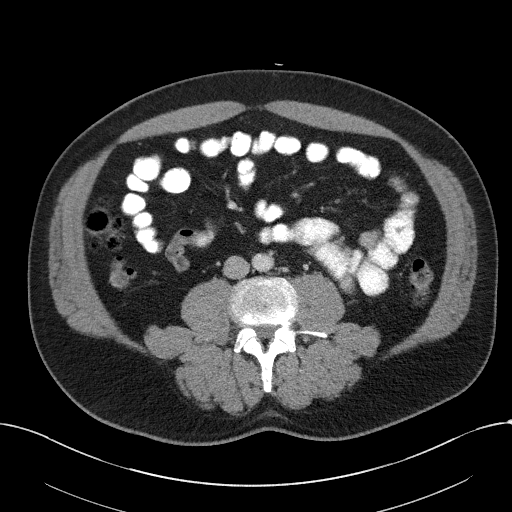
[im 66/101  soft-tissue]
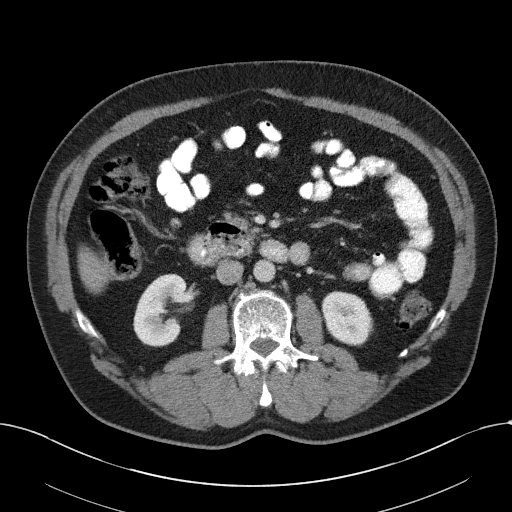
[im 66/101  bone]
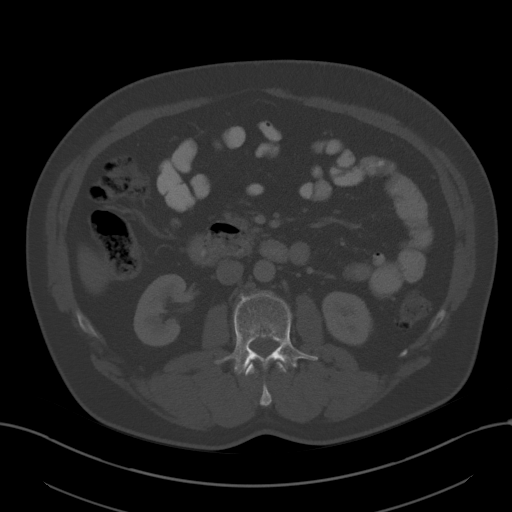
[im 71/101  soft-tissue]
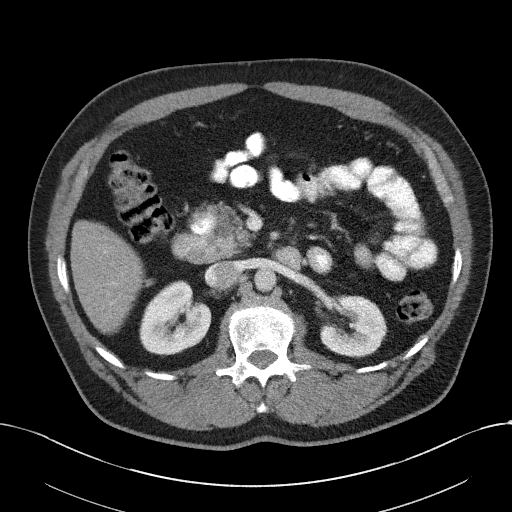
[im 81/101  soft-tissue]
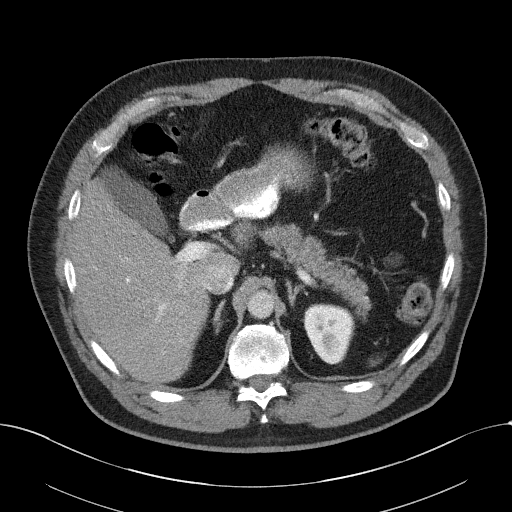
[im 86/101  soft-tissue]
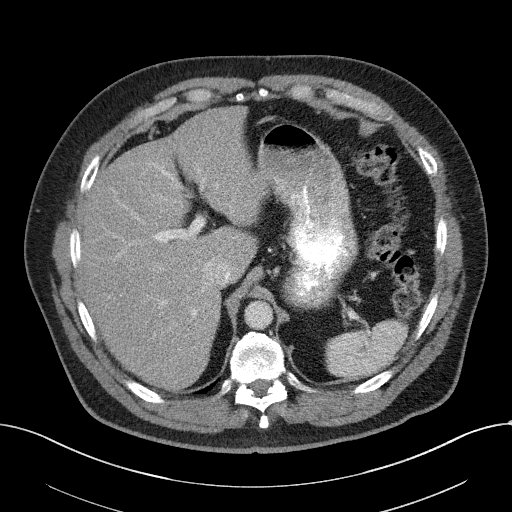
[im 96/101  soft-tissue]
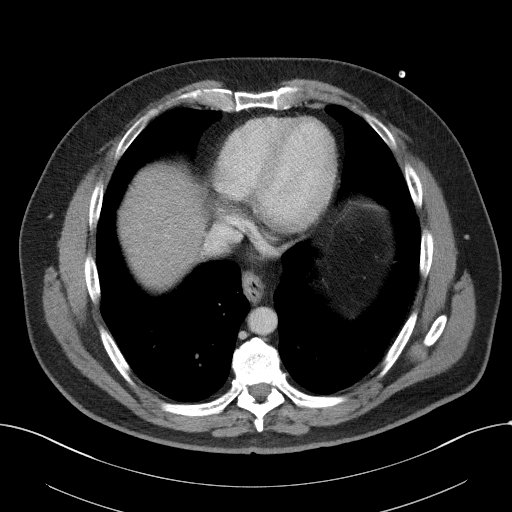

[Series 5: coronal st · coronal · 0.83mm/px · 3 of 110 slices shown]
[im 37/110  soft-tissue]
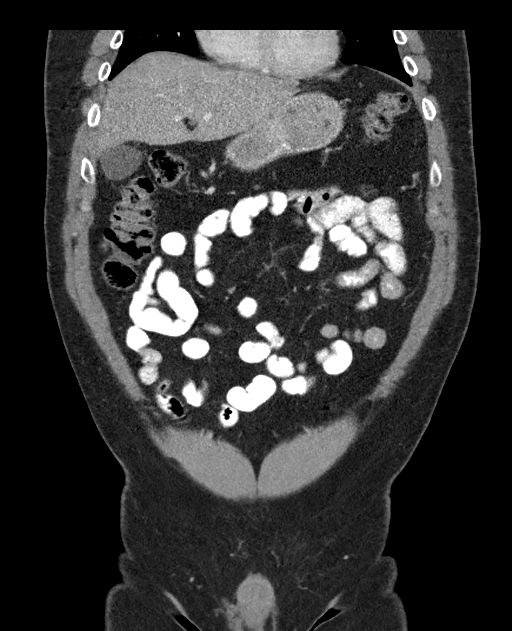
[im 49/110  soft-tissue]
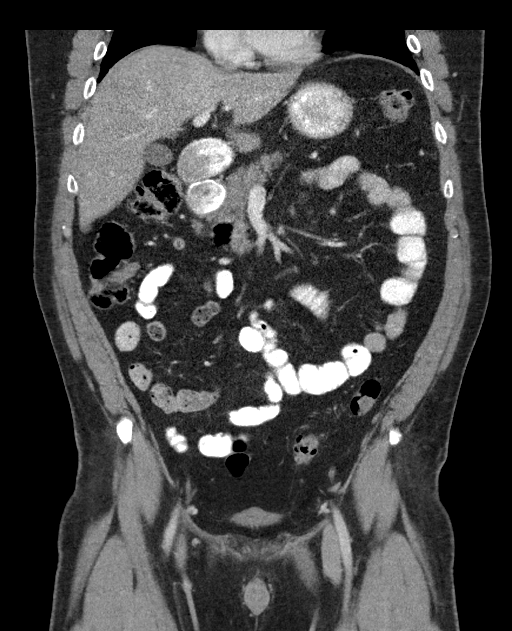
[im 61/110  soft-tissue]
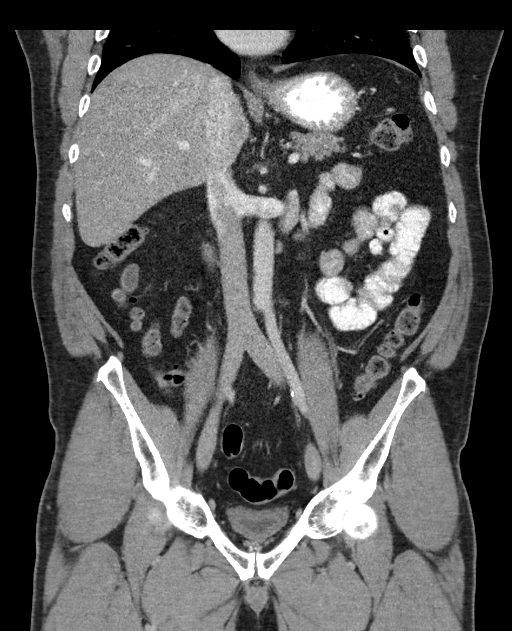

[16 of 46 positions shown; findings below may reference images not displayed]

FINDINGS: Lower chest: The lung bases are clear. The heart size is normal.

Hepatobiliary: The liver is normal. Normal gallbladder.There is no
biliary ductal dilation.

Pancreas: Normal contours without ductal dilatation. No
peripancreatic fluid collection.

Spleen: Unremarkable.

Adrenals/Urinary Tract:

--Adrenal glands: Unremarkable.

--Right kidney/ureter: No hydronephrosis or radiopaque kidney
stones.

--Left kidney/ureter: No hydronephrosis or radiopaque kidney stones.

--Urinary bladder: Unremarkable.

Stomach/Bowel:

--Stomach/Duodenum: No hiatal hernia or other gastric abnormality.
Normal duodenal course and caliber.

--Small bowel: Unremarkable.

--Colon: There is scattered colonic diverticula without CT evidence
for diverticulitis.

--Appendix: Normal.

Vascular/Lymphatic: Atherosclerotic calcification is present within
the non-aneurysmal abdominal aorta, without hemodynamically
significant stenosis.

--No retroperitoneal lymphadenopathy.

--No mesenteric lymphadenopathy.

--No pelvic or inguinal lymphadenopathy.

Reproductive: Unremarkable

Other: No ascites or free air. The abdominal wall is normal.

Musculoskeletal. No acute displaced fractures.
IMPRESSION: 1. No acute abdominopelvic abnormality.
2. Scattered colonic diverticula without CT evidence for
diverticulitis.

Aortic Atherosclerosis ([id number]).

## 2021-03-24 ENCOUNTER — Other Ambulatory Visit: Payer: Self-pay

## 2021-03-24 ENCOUNTER — Ambulatory Visit: Payer: BC Managed Care – PPO | Admitting: Emergency Medicine

## 2021-03-24 ENCOUNTER — Encounter: Payer: Self-pay | Admitting: Emergency Medicine

## 2021-03-24 VITALS — BP 124/62 | HR 69 | Ht 66.0 in | Wt 230.0 lb

## 2021-03-24 DIAGNOSIS — M1A9XX1 Chronic gout, unspecified, with tophus (tophi): Secondary | ICD-10-CM | POA: Diagnosis not present

## 2021-03-24 DIAGNOSIS — Z23 Encounter for immunization: Secondary | ICD-10-CM

## 2021-03-24 DIAGNOSIS — M109 Gout, unspecified: Secondary | ICD-10-CM | POA: Insufficient documentation

## 2021-03-24 MED ORDER — COLCHICINE 0.6 MG PO CAPS: 0.6000 mg | ORAL_CAPSULE | Freq: Every day | ORAL | 3 refills | Status: DC

## 2021-03-24 MED ORDER — ALLOPURINOL 100 MG PO TABS: 100.0000 mg | ORAL_TABLET | Freq: Every day | ORAL | 3 refills | Status: DC

## 2021-03-24 NOTE — Patient Instructions (Signed)
Gota Gout La gota es una hinchazn dolorosa de las articulaciones. La gota es un tipo de artritis. Es causada por el exceso de cido rico en el cuerpo. El cido rico es una sustancia qumica que se produce cuando el cuerpo descompone sustancias llamadas purinas. Si el cuerpo tiene un exceso de cido rico, se pueden formar cristales con punta y acumularse en las articulaciones. Esto provoca dolor e hinchazn. Las crisis de gota pueden ocurrir rpidamente y ser muy dolorosas (gota [place]). Con el tiempo, las crisis pueden afectar ms articulaciones y ocurrir con mayor frecuencia (gota crnica). Cules son las causas? El exceso de cido rico en la [place]. Esto puede ocurrir debido a lo siguiente: Los riones no [employer] cantidad suficiente de cido rico de Herbalist. El cuerpo produce demasiado cido rico. Come demasiados alimentos ricos en purinas. Estos alimentos incluyen vsceras, algunos mariscos y Engineer, manufacturing systems. Un traumatismo o estrs. Qu incrementa el riesgo? Tener antecedentes familiares de gota. Ser hombre de [employer]. Ser mujer y haber atravesado la menopausia. Tener mucho sobrepeso (obesidad). Beber alcohol, en especial, cerveza. No tener la cantidad suficiente de agua en el organismo (estar deshidratado). Perder peso con demasiada rapidez. Haber tenido un trasplante de rgano. Haberse intoxicado con plomo. Tomar ciertos medicamentos. Tener enfermedades renales. Tener una afeccin de la piel llamada psoriasis. Cules son los signos o los sntomas? Ardelia Mems crisis de gota aguda generalmente ocurre solo en Insurance claims handler. El lugar ms comn es el pulgar del pie. Las crisis a menudo comienzan por la noche. Tambin pueden verse afectadas las articulaciones de los pies, los tobillos, las rodillas, los dedos de la mano, las muecas o los codos. Los sntomas de una crisis pueden incluir lo siguiente: Dolor intenso. Calor. Hinchazn. Entumecimiento. Piel brillosa, roja o  morada. Dolor a Secretary/administrator. Se puede sentir mucho dolor al tacto en la articulacin afectada. Cristy Hilts y escalofros. La gota crnica puede provocar sntomas con ms frecuencia. Pueden verse involucradas ms articulaciones. Es posible que tambin tenga bultos blancos o amarillos (tofos) en las manos o los pies, o en otras zonas cercanas a las articulaciones. Cmo se trata? El tratamiento para esta afeccin tiene dos fases: tratar un ataque agudo y prevenir ataques futuros. El tratamiento de la gota aguda puede incluir lo siguiente: Antiinflamatorios no esteroideos (AINE). Corticoesteroides. Se toman por la boca o se pueden inyectar en una articulacin. Colchicina. Este medicamento [employer] dolor y la hinchazn. Puede administrarse por la boca o por una va intravenosa. El tratamiento preventivo puede incluir lo siguiente: El uso diario de dosis bajas de antiinflamatorios no esteroideos (AINE) o Scientist, research (medical). El uso de un medicamento que reduce los niveles de cido rico en la [place]. Realizar cambios en la dieta. Es posible que deba consultar a un Research officer, political party (nutricionista) acerca de lo que debe comer y beber para Biochemist, clinical. Siga estas indicaciones en su casa: Durante una crisis de gota  Si se lo indican, aplique hielo sobre la zona dolorida: Ponga el hielo en una bolsa plstica. Coloque una toalla entre la piel y Therapist, nutritional. Coloque el hielo durante 20 minutos, 2 a 3 veces por da. Levante (eleve) la articulacin dolorida por encima del nivel del corazn tan frecuentemente como sea posible. Haga reposo todo el tiempo que pueda. Si la articulacin est en la pierna, quiz deba usar muletas. Siga las indicaciones del mdico respecto de lo que no puede comer o beber. Cmo evitar las crisis de gota en el futuro Siga una dieta  con bajo contenido de purinas. Evite el consumo de alimentos y bebidas como los  siguientes: Hgado. Rin. Anchoas. Esprragos. Arenque. Hongos. Mejillones. Cerveza. Mantenga un peso saludable. Si desea adelgazar, hable con el mdico. No baje de peso demasiado rpido. Comience o contine con un plan de ejercicios como se lo haya indicado el mdico. Comida y bebida Beba suficiente lquido para mantener el pis (la [place]) de color amarillo plido. Si bebe alcohol: Limite la cantidad que bebe a lo siguiente: De 0 a 1 medida por da para las mujeres. De 0 a 2 medidas por da para los hombres. Est atento a la cantidad de alcohol que hay en las bebidas que toma. En los [place], una medida equivale a una botella de cerveza de 12 oz (355 ml), un vaso de vino de 5 oz (148 ml) o un vaso de una bebida alcohlica de alta graduacin de 1 oz (44 ml). Indicaciones generales [employer] de venta libre y los recetados solamente como se lo haya indicado el mdico. No conduzca ni use maquinaria pesada mientras toma analgsicos recetados. Retome sus actividades normales segn lo indicado por el mdico. Pregntele al mdico qu actividades son seguras para usted. Concurra a todas las visitas de control como se lo haya indicado el mdico. Esto es importante. Comunquese con un mdico si: Tiene otra crisis de gota. Sigue teniendo sntomas de [place] crisis de gota despus de 10 das de [place]. Tiene problemas (efectos secundarios) debido a los medicamentos. Siente escalofros o tiene fiebre. Tiene sensacin de [employer] al [employer]. Siente dolor en la parte inferior de la espalda o en el abdomen. Solicite ayuda inmediatamente si: Interior and spatial designer. No es posible Financial controller. No puede hacer pis. Resumen La gota es una hinchazn dolorosa de las articulaciones. El lugar ms habitual donde se presenta el dolor es el dedo gordo del pie, pero tambin pueden verse afectadas otras articulaciones. Los medicamentos y evitar algunos alimentos pueden ayudar a prevenir  y tratar las crisis de Accident. Esta informacin no tiene Marine scientist el consejo del mdico. Asegrese de hacerle al mdico cualquier pregunta que tenga. Document Revised: 09/21/2017 Document Reviewed: 09/21/2017 Elsevier Patient Education  2022 [employer].

## 2021-03-24 NOTE — Progress Notes (Signed)
[name] 61 y.o.   Chief Complaint  Patient presents with   Follow-up    Gout    HISTORY OF PRESENT ILLNESS: This is a 61 y.o. male with history of gout here for follow-up. Lab Results  Component Value Date   LABURIC 7.6 02/20/2021     HPI   Prior to Admission medications   Medication Sig Start Date End Date Taking? Authorizing Provider  Colchicine 0.6 MG CAPS Take 1 capsules immediately. Then take 1 capsule two hours later. Then take 1 capsule daily until pain has resolved. 02/20/21  Yes [name], NP  famotidine (PEPCID) 40 MG tablet Take 1 tablet (40 mg total) by mouth daily. 11/12/20  Yes Burns, Claudina Lick, MD  indomethacin (INDOCIN) 50 MG capsule Take 1 capsule (50 mg total) by mouth 3 (three) times daily with meals. 02/20/21  Yes [name], NP  sildenafil (VIAGRA) 100 MG tablet Take 1 tablet (100 mg total) by mouth as needed for erectile dysfunction. 08/23/19  Yes Horald Pollen, MD    No Known Allergies  Patient Active Problem List   Diagnosis Date Noted   Obesity (BMI 30.0-34.9) 03/27/2018   Obstructive sleep apnea on CPAP 11/01/2017   Erectile dysfunction 11/01/2017   Paroxysmal atrial fibrillation ([place]) 03/03/2017    Past Medical History:  Diagnosis Date   Atrial fibrillation ([place])    a. 04/2017 s/p PVI.   Cataract    left    GERD (gastroesophageal reflux disease)    Sleep apnea    "waiting on my CPAP" (05/23/2017)    Past Surgical History:  Procedure Laterality Date   ATRIAL FIBRILLATION ABLATION N/A 05/06/2017   Procedure: ATRIAL FIBRILLATION ABLATION;  Surgeon: Constance Haw, MD;  Location: [place] [facility];  Service: Cardiovascular;  Laterality: N/A;   CARDIOVERSION N/A 03/04/2017   Procedure: CARDIOVERSION;  Surgeon: Adrian Prows, MD;  Location: [place];  Service: Cardiovascular;  Laterality: N/A;   LEFT HEART CATH AND CORONARY ANGIOGRAPHY N/A 05/24/2017   Procedure: LEFT HEART CATH AND CORONARY ANGIOGRAPHY  possible angioplasty;  Surgeon: Nigel Mormon, MD;  Location: [place] [facility];  Service: Cardiovascular;  Laterality: N/A;   WISDOM TOOTH EXTRACTION     3 removed - 1 remains    Social History   Socioeconomic History   Marital status: Married    Spouse name: Not on file   Number of children: 2   Years of education: Not on file   Highest education level: Not on file  Occupational History   Not on file  Tobacco Use   Smoking status: Former    Packs/day: 1.00    Years: 20.00    Pack years: 20.00    Types: Cigarettes    Quit date: 54    Years since quitting: 25.1   Smokeless tobacco: Never  Vaping Use   Vaping Use: Never used  Substance and Sexual Activity   Alcohol use: Yes    Alcohol/week: 3.0 standard drinks    Types: 3 Glasses of wine per week    Comment: occasionally   Drug use: No   Sexual activity: Yes  Other Topics Concern   Not on file  Social History Narrative   ** Merged History Encounter **       Social Determinants of Health   Financial Resource Strain: Not on file  Food Insecurity: Not on file  Transportation Needs: Not on file  Physical Activity: Not on file  Stress: Not on file  Social Connections: Not on file  Intimate Partner Violence: Not on file    Family History  Problem Relation Age of Onset   Diabetes Mother    Hyperlipidemia Mother    Diabetes Father    Hyperlipidemia Father    Esophageal cancer Father    Drug abuse Sister    Hyperlipidemia Maternal Grandmother    Colon cancer Neg Hx    Pancreatic cancer Neg Hx    Prostate cancer Neg Hx    Rectal cancer Neg Hx    Stomach cancer Neg Hx      Review of Systems  Constitutional: Negative.  Negative for chills and fever.  HENT: Negative.  Negative for congestion and sore throat.   Respiratory: Negative.  Negative for cough and shortness of breath.   Cardiovascular: Negative.  Negative for chest pain and palpitations.  Gastrointestinal: Negative.  Negative for abdominal  pain, diarrhea, nausea and vomiting.  Genitourinary: Negative.   Musculoskeletal:  Positive for joint pain.  Skin: Negative.  Negative for rash.  Neurological: Negative.  Negative for dizziness and headaches.  All other systems reviewed and are negative.  Today's Vitals   03/24/21 1605  BP: 124/62  Pulse: 69  SpO2: 97%  Weight: 230 lb (104.3 kg)  Height: 5\' 6"  (1.676 m)   Body mass index is 37.12 kg/m.  Physical Exam Vitals reviewed.  Constitutional:      Appearance: Normal appearance.  HENT:     Head: Normocephalic.  Eyes:     Extraocular Movements: Extraocular movements intact.     Pupils: Pupils are equal, round, and reactive to light.  Cardiovascular:     Rate and Rhythm: Normal rate and regular rhythm.     Pulses: Normal pulses.     Heart sounds: Normal heart sounds.  Pulmonary:     Effort: Pulmonary effort is normal.     Breath sounds: Normal breath sounds.  Musculoskeletal:     Cervical back: No tenderness.     Comments: Small tophi on PIP joints both hands  Lymphadenopathy:     Cervical: No cervical adenopathy.  Skin:    General: Skin is warm and dry.     Capillary Refill: Capillary refill takes less than 2 seconds.  Neurological:     General: No focal deficit present.     Mental Status: He is alert and oriented to person, place, and time.  Psychiatric:        Mood and Affect: Mood normal.        Behavior: Behavior normal.     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  Normal renal function on most recent labs. Uric acid level is 7.6.  Goal is less than 6.  We will start allopurinol 100 mg daily. Advised to also take daily colchicine 0.6 mg. Diet and nutrition discussed.      Relevant Medications   allopurinol (ZYLOPRIM) 100 MG tablet   Colchicine 0.6 MG CAPS   Other Visit Diagnoses     Need for shingles vaccine        Relevant Orders   Varicella-zoster vaccine IM (Shingrix) (Completed)      Patient Instructions  Gota Gout La gota es una hinchazn dolorosa de las articulaciones. La gota es un tipo de artritis. Es causada por el exceso de cido rico en el cuerpo. El cido rico es [place]  sustancia qumica que se produce cuando el cuerpo descompone sustancias llamadas purinas. Si el cuerpo tiene un exceso de cido rico, se pueden formar cristales con punta y acumularse en las articulaciones. Esto provoca dolor e hinchazn. Las crisis de gota pueden ocurrir rpidamente y ser muy dolorosas (gota [place]). Con el tiempo, las crisis pueden afectar ms articulaciones y ocurrir con mayor frecuencia (gota crnica). Cules son las causas? El exceso de cido rico en la [place]. Esto puede ocurrir debido a lo siguiente: Los riones no [employer] [place] [employer] cantidad suficiente de cido rico de Herbalist. El cuerpo produce demasiado cido rico. Come demasiados alimentos ricos en purinas. Estos alimentos incluyen vsceras, algunos mariscos y Engineer, manufacturing systems. Un traumatismo o estrs. Qu incrementa el riesgo? Tener antecedentes familiares de gota. Ser hombre de [employer]. Ser mujer y haber atravesado la menopausia. Tener mucho sobrepeso (obesidad). Beber alcohol, en especial, cerveza. No tener la cantidad suficiente de agua en el organismo (estar deshidratado). Perder peso con demasiada rapidez. Haber tenido un trasplante de rgano. Haberse intoxicado con plomo. Tomar ciertos medicamentos. Tener enfermedades renales. Tener una afeccin de la piel llamada psoriasis. Cules son los signos o los sntomas? Ardelia Mems crisis de gota aguda generalmente ocurre solo en Insurance claims handler. El lugar ms comn es el pulgar del pie. Las crisis a menudo comienzan por la noche. Tambin pueden verse afectadas las articulaciones de los pies, los tobillos, las rodillas, los dedos de la mano, las muecas o los codos. Los sntomas de una crisis pueden  incluir lo siguiente: Dolor intenso. Calor. Hinchazn. Entumecimiento. Piel brillosa, roja o morada. Dolor a Secretary/administrator. Se puede sentir mucho dolor al tacto en la articulacin afectada. Cristy Hilts y escalofros. La gota crnica puede provocar sntomas con ms frecuencia. Pueden verse involucradas ms articulaciones. Es posible que tambin tenga bultos blancos o amarillos (tofos) en las manos o los pies, o en otras zonas cercanas a las articulaciones. Cmo se trata? El tratamiento para esta afeccin tiene dos fases: tratar un ataque agudo y prevenir ataques futuros. El tratamiento de la gota aguda puede incluir lo siguiente: Antiinflamatorios no esteroideos (AINE). Corticoesteroides. Se toman por la boca o se pueden inyectar en una articulacin. Colchicina. Este medicamento [employer] dolor y la hinchazn. Puede administrarse por la boca o por una va intravenosa. El tratamiento preventivo puede incluir lo siguiente: El uso diario de dosis bajas de antiinflamatorios no esteroideos (AINE) o Scientist, research (medical). El uso de un medicamento que reduce los niveles de cido rico en la [place]. Realizar cambios en la dieta. Es posible que deba consultar a un Research officer, political party (nutricionista) acerca de lo que debe comer y beber para Biochemist, clinical. Siga estas indicaciones en su casa: Durante una crisis de gota  Si se lo indican, aplique hielo sobre la zona dolorida: Ponga el hielo en una bolsa plstica. Coloque una toalla entre la piel y Therapist, nutritional. Coloque el hielo durante 20 minutos, 2 a 3 veces por da. Levante (eleve) la articulacin dolorida por encima del nivel del corazn tan frecuentemente como sea posible. Haga reposo todo el tiempo que pueda. Si la articulacin est en la pierna, quiz deba usar muletas. Siga las indicaciones del mdico respecto de lo que no puede comer o beber. Cmo evitar las crisis de gota en el futuro Siga una dieta con bajo contenido de purinas. Evite el consumo de  alimentos y bebidas como los siguientes: Hgado. Rin. Anchoas. Esprragos. Arenque. Hongos. Mejillones. Cerveza. Mantenga un peso saludable. Si desea adelgazar, hable con el  mdico. No baje de peso demasiado rpido. Comience o contine con un plan de ejercicios como se lo haya indicado el mdico. Comida y bebida Beba suficiente lquido para mantener el pis (la [place]) de color amarillo plido. Si bebe alcohol: Limite la cantidad que bebe a lo siguiente: De 0 a 1 medida por da para las mujeres. De 0 a 2 medidas por da para los hombres. Est atento a la cantidad de alcohol que hay en las bebidas que toma. En los [place], una medida equivale a una botella de cerveza de 12 oz (355 ml), un vaso de vino de 5 oz (148 ml) o un vaso de una bebida alcohlica de alta graduacin de 1 oz (44 ml). Indicaciones generales [employer] de venta libre y los recetados solamente como se lo haya indicado el mdico. No conduzca ni use maquinaria pesada mientras toma analgsicos recetados. Retome sus actividades normales segn lo indicado por el mdico. Pregntele al mdico qu actividades son seguras para usted. Concurra a todas las visitas de control como se lo haya indicado el mdico. Esto es importante. Comunquese con un mdico si: Tiene otra crisis de gota. Sigue teniendo sntomas de [place] crisis de gota despus de 10 das de [place]. Tiene problemas (efectos secundarios) debido a los medicamentos. Siente escalofros o tiene fiebre. Tiene sensacin de [employer] al [employer]. Siente dolor en la parte inferior de la espalda o en el abdomen. Solicite ayuda inmediatamente si: Interior and spatial designer. No es posible Financial controller. No puede hacer pis. Resumen La gota es una hinchazn dolorosa de las articulaciones. El lugar ms habitual donde se presenta el dolor es el dedo gordo del pie, pero tambin pueden verse afectadas otras articulaciones. Los medicamentos y evitar algunos  alimentos pueden ayudar a prevenir y tratar las crisis de [place]. Esta informacin no tiene Marine scientist el consejo del mdico. Asegrese de hacerle al mdico cualquier pregunta que tenga. Document Revised: 09/21/2017 Document Reviewed: 09/21/2017 Elsevier Patient Education  2022 [place], MD [facility]

## 2021-03-24 NOTE — Assessment & Plan Note (Signed)
Starting to develop tophi.  Normal renal function on most recent labs. Uric acid level is 7.6.  Goal is less than 6.  We will start allopurinol 100 mg daily. Advised to also take daily colchicine 0.6 mg. Diet and nutrition discussed.

## 2021-03-31 ENCOUNTER — Encounter: Payer: Self-pay | Admitting: Gastroenterology

## 2021-04-06 ENCOUNTER — Encounter: Payer: Self-pay | Admitting: Gastroenterology

## 2021-05-02 HISTORY — PX: TOOTH EXTRACTION: SUR596

## 2021-05-26 ENCOUNTER — Ambulatory Visit (AMBULATORY_SURGERY_CENTER): Payer: Self-pay | Admitting: *Deleted

## 2021-05-26 ENCOUNTER — Encounter: Payer: Self-pay | Admitting: Gastroenterology

## 2021-05-26 VITALS — Ht 67.0 in | Wt 233.0 lb

## 2021-05-26 DIAGNOSIS — Z86010 Personal history of colonic polyps: Secondary | ICD-10-CM

## 2021-05-26 MED ORDER — PEG 3350-KCL-NA BICARB-NACL 420 G PO SOLR: 4000.0000 mL | Freq: Once | ORAL | 0 refills | Status: AC

## 2021-05-26 NOTE — Progress Notes (Signed)
No egg or soy allergy known to patient  ?No issues known to pt with past sedation with any surgeries or procedures ?Patient denies ever being told they had issues or difficulty with intubation  ?No FH of Malignant Hyperthermia ?Pt is not on diet pills ?Pt is not on  home 02  ?Pt is not on blood thinners  ?Pt denies issues with constipation  ?Past hx of  A fib and A flutter- cardioversion for flutter and ablation for Fib  ? ? Pt given the option in PV today for Golytely prep verses  alternative prep  ( Suprep/Plenvu)-  Pt is aware the Golytely has more volume but is more cost effective and the Suprep/Plenvu is less volume but may cost $90-150.  Pt voiced understanding of this and choose Golytely  Prep.  ? ?PV completed over the phone. Pt verified name, DOB, address and insurance during PV today.  ?Pt mailed instruction packet with copy of consent form to read and not return, and instructions.  ?Pt encouraged to call with questions or issues.  ?If pt has My chart, procedure instructions sent via My Chart  ?Insurance confirmed with pt at [facility] today - interpreter in [place] with pt today  ? ?

## 2021-06-16 ENCOUNTER — Ambulatory Visit (AMBULATORY_SURGERY_CENTER): Payer: BC Managed Care – PPO | Admitting: Gastroenterology

## 2021-06-16 ENCOUNTER — Encounter: Payer: Self-pay | Admitting: Gastroenterology

## 2021-06-16 VITALS — BP 118/63 | HR 64 | Temp 98.0°F | Resp 14 | Ht 66.0 in | Wt 233.0 lb

## 2021-06-16 DIAGNOSIS — D124 Benign neoplasm of descending colon: Secondary | ICD-10-CM

## 2021-06-16 DIAGNOSIS — D120 Benign neoplasm of cecum: Secondary | ICD-10-CM

## 2021-06-16 DIAGNOSIS — D123 Benign neoplasm of transverse colon: Secondary | ICD-10-CM

## 2021-06-16 DIAGNOSIS — Z86010 Personal history of colonic polyps: Secondary | ICD-10-CM

## 2021-06-16 MED ORDER — SODIUM CHLORIDE 0.9 % IV SOLN: 500.0000 mL | Freq: Once | INTRAVENOUS | Status: DC

## 2021-06-16 NOTE — Progress Notes (Signed)
Jaw thrust for obstructive breathing ?

## 2021-06-16 NOTE — Op Note (Signed)
[facility] ?Patient Name: Evan Page ?Procedure Date: 06/16/2021 1:18 PM ?MRN: [id number] ?Endoscopist: Estill Cotta. Loletha Carrow , MD ?Age: 61 ?Referring MD:  ?Date of Birth: 02-Aug-1960 ?Gender: Male ?Account #: [id number] ?Procedure:                Colonoscopy ?Indications:              Surveillance: Personal history of adenomatous  ?                          polyps on last colonoscopy > 5 years ago ?Medicines:                Monitored Anesthesia Care ?Procedure:                Pre-Anesthesia Assessment: ?                          - Prior to the procedure, a History and Physical  ?                          was performed, and patient medications and  ?                          allergies were reviewed. The patient's tolerance of  ?                          previous anesthesia was also reviewed. The risks  ?                          and benefits of the procedure and the sedation  ?                          options and risks were discussed with the patient.  ?                          All questions were answered, and informed consent  ?                          was obtained. Prior Anticoagulants: The patient has  ?                          taken no previous anticoagulant or antiplatelet  ?                          agents. ASA Grade Assessment: III - A patient with  ?                          severe systemic disease. After reviewing the risks  ?                          and benefits, the patient was deemed in  ?                          satisfactory condition to undergo the procedure. ?  After obtaining informed consent, the colonoscope  ?                          was passed under direct vision. Throughout the  ?                          procedure, the patient's blood pressure, pulse, and  ?                          oxygen saturations were monitored continuously. The  ?                          CF [id number] #[id number] was introduced through the anus  ?                          and advanced to  the the cecum, identified by  ?                          appendiceal orifice and ileocecal valve. The  ?                          colonoscopy was performed without difficulty. The  ?                          patient tolerated the procedure well. The quality  ?                          of the bowel preparation was excellent. The  ?                          ileocecal valve, appendiceal orifice, and rectum  ?                          were photographed. ?Scope In: 1:29:56 PM ?Scope Out: 1:48:37 PM ?Scope Withdrawal Time: 0 hours 16 minutes 5 seconds  ?Total Procedure Duration: 0 hours 18 minutes 41 seconds  ?Findings:                 The perianal and digital rectal examinations were  ?                          normal. ?                          A diminutive polyp was found in the cecum. The  ?                          polyp was sessile. The polyp was removed with a  ?                          cold biopsy forceps. Resection and retrieval were  ?                          complete. (Jar 1) ?  Three sessile polyps were found in the transverse  ?                          colon. The polyps were diminutive in size. These  ?                          polyps were removed with a cold snare. Resection  ?                          and retrieval were complete. (Jar 1) ?                          A diminutive polyp was found in the descending  ?                          colon. The polyp was sessile. The polyp was removed  ?                          with a cold snare. Resection and retrieval were  ?                          complete. (Jar 2) ?                          Multiple diverticula were found in the left colon  ?                          and right colon. ?                          Repeat examination of right colon under NBI  ?                          performed. ?                          The exam was otherwise without abnormality on  ?                          direct and retroflexion views. ?Complications:             No immediate complications. ?Estimated Blood Loss:     Estimated blood loss was minimal. ?Impression:               - One diminutive polyp in the cecum, removed with a  ?                          cold biopsy forceps. Resected and retrieved. ?                          - Three diminutive polyps in the transverse colon,  ?                          removed with a cold snare. Resected and retrieved. ?                          -  One diminutive polyp in the descending colon,  ?                          removed with a cold snare. Resected and retrieved. ?                          - Diverticulosis in the left colon and in the right  ?                          colon. ?                          - The examination was otherwise normal on direct  ?                          and retroflexion views. ?Recommendation:           - Patient has a contact number available for  ?                          emergencies. The signs and symptoms of potential  ?                          delayed complications were discussed with the  ?                          patient. Return to normal activities tomorrow.  ?                          Written discharge instructions were provided to the  ?                          patient. ?                          - Resume previous diet. ?                          - Continue present medications. ?                          - Await pathology results. ?                          - Repeat colonoscopy is recommended for  ?                          surveillance. The colonoscopy date will be  ?                          determined after pathology results from today's  ?                          exam become available for review. ?[name] L. Loletha Carrow, MD ?06/16/2021 1:53:59 PM ?This report has been signed electronically. ?

## 2021-06-16 NOTE — Progress Notes (Signed)
Called to room to assist during endoscopic procedure.  Patient ID and intended procedure confirmed with present staff. Received instructions for my participation in the procedure from the performing physician.  

## 2021-06-16 NOTE — Progress Notes (Signed)
History and Physical: ? This patient presents for endoscopic testing for: ?Encounter Diagnosis  ?Name Primary?  ? Hx of colonic polyps Yes  ? ? ?01/2016 TA polyps x 2 ?Patient denies chronic abdominal pain, rectal bleeding, constipation or diarrhea. ? ? ?Patient is otherwise without complaints or active issues today. ? ? ?Past Medical History: ?Past Medical History:  ?Diagnosis Date  ? Atrial fibrillation ([place])   ? a. 04/2017 s/p PVI. with ablation 05-06-2017  ? Atrial flutter ([place]) 2019  ? with cardioversion  ? Cataract   ? left small  ? GERD (gastroesophageal reflux disease)   ? past hx  ? Gout   ? Pre-diabetes   ? Sleep apnea   ? on CPAP"  ? ? ? ?Past Surgical History: ?Past Surgical History:  ?Procedure Laterality Date  ? ATRIAL FIBRILLATION ABLATION N/A 05/06/2017  ? Procedure: ATRIAL FIBRILLATION ABLATION;  Surgeon: Constance Haw, MD;  Location: [place] [facility];  Service: Cardiovascular;  Laterality: N/A;  ? CARDIOVERSION N/A 03/04/2017  ? Procedure: CARDIOVERSION;  Surgeon: Adrian Prows, MD;  Location: [place];  Service: Cardiovascular;  Laterality: N/A;  ? LEFT HEART CATH AND CORONARY ANGIOGRAPHY N/A 05/24/2017  ? Procedure: LEFT HEART CATH AND CORONARY ANGIOGRAPHY possible angioplasty;  Surgeon: Nigel Mormon, MD;  Location: [place] [facility];  Service: Cardiovascular;  Laterality: N/A;  ? TOOTH EXTRACTION  05/2021  ? [place]  ? WISDOM TOOTH EXTRACTION    ? all 4  ? ? ?Allergies: ?No Known Allergies ? ?Outpatient Meds: ?Current Outpatient Medications  ?Medication Sig Dispense Refill  ? allopurinol (ZYLOPRIM) 100 MG tablet Take 1 tablet (100 mg total) by mouth daily. 90 tablet 3  ? sildenafil (VIAGRA) 100 MG tablet Take 1 tablet (100 mg total) by mouth as needed for erectile dysfunction. 6 tablet 19  ? Colchicine 0.6 MG CAPS Take 0.6 mg by mouth daily. Take 1 capsules immediately. Then take 1 capsule two hours later. Then take 1 capsule daily until pain has resolved. 90 capsule 3  ?  indomethacin (INDOCIN) 50 MG capsule Take 1 capsule (50 mg total) by mouth 3 (three) times daily with meals. (Patient not taking: Reported on 06/16/2021) 9 capsule 0  ? ?Current Facility-Administered Medications  ?Medication Dose Route Frequency Provider Last Rate Last Admin  ? 0.9 %  sodium chloride infusion  500 mL Intravenous Once Doran Stabler, MD      ? ? ? ? ?___________________________________________________________________ ?Objective  ? ?Exam: ? ?BP (!) 118/55   Pulse (!) 57   Temp 98 ?F (36.7 ?C)   Ht '5\' 6"'$  (1.676 m)   Wt 233 lb (105.7 kg)   SpO2 95%   BMI 37.61 kg/m?  ? ?CV: RRR without murmur, S1/S2 ?Resp: clear to auscultation bilaterally, normal RR and effort noted ?GI: soft, no tenderness, with active bowel sounds. ? ? ?Assessment: ?Encounter Diagnosis  ?Name Primary?  ? Hx of colonic polyps Yes  ? ? ? ?Plan: ?Colonoscopy ? The benefits and risks of the planned procedure were described in detail with the patient or (when appropriate) their health care proxy.  Risks were outlined as including, but not limited to, bleeding, infection, perforation, adverse medication reaction leading to cardiac or pulmonary decompensation, pancreatitis (if ERCP).  The limitation of incomplete mucosal visualization was also discussed.  No guarantees or warranties were given. ?(Spanish interpreter present) ? ? ?The patient is appropriate for an endoscopic procedure in the ambulatory setting. ? ? - Wilfrid Lund, MD ? ? ? ? ?

## 2021-06-16 NOTE — Progress Notes (Signed)
Pt's states no medical or surgical changes since previsit or office visit. 

## 2021-06-16 NOTE — Patient Instructions (Addendum)
Resume previous medications.  5 polyps removed and sent to pathology.  Await results for final recommendations.   ? ?Handouts on findings given to patient.   (Polyps and diverticulosis) ? ?USTED Evan Page PROCEDIMIENTO ENDOSC?PICO HOY EN EL [facility]:   Lea el informe del procedimiento que se le entreg? para cualquier pregunta espec?fica sobre lo que se encontr? Education administrator.  Si el informe del examen no responde a sus preguntas, por favor llame a su gastroenter?logo para aclararlo.  Si usted solicit? que no se le den [employer] de lo que se Estate manager/land agent? en su procedimiento al acompa?ante que le va a cuidar, entonces el informe del procedimiento se ha incluido en un sobre sellado para que usted lo revise despu?s cuando le sea m?s conveniente. ?  ?LO QUE PUEDE ESPERAR: Algunas sensaciones de hinchaz?n en el abdomen.  Puede tener m?s gases de lo normal.  El caminar puede ayudarle a eliminar el aire que se le puso en el tracto gastrointestinal durante el procedimiento y reducir la hinchaz?n.  Si le hicieron una endoscopia inferior (como una colonoscopia o una sigmoidoscopia flexible), podr?a notar manchas de sangre en las heces fecales o en el papel higi?nico.  Si se someti? a una preparaci?n intestinal para su procedimiento, es posible que no tenga una evacuaci?n intestinal normal durante algunos d?as. ?  ?Tenga en cuenta:  Es posible que note un poco de irritaci?n y congesti?n en la nariz o alg?n drenaje.  Esto es debido al ox?geno utilizado durante su procedimiento.  No hay que preocuparse y esto debe desaparecer m?s o menos en un d?a. ?  ?S?NTOMAS PARA REPORTAR INMEDIATAMENTE: ? Despu?s de una endoscopia inferior (colonoscopia o sigmoidoscopia flexible): ? Cantidades excesivas de sangre en las heces fecales ? Sensibilidad significativa o empeoramiento de los dolores abdominales  ? Hinchaz?n aguda del abdomen que antes no ten?a  ? Fiebre de 100?F o m?s ?  ?  ?Para asuntos urgentes o de emergencia, puede  comunicarse con un gastroenter?logo a cualquier hora llamando al [phone]. ? ?DIETA:  Recomendamos una comida peque?a al principio, pero luego puede continuar con su dieta normal.  Tome muchos l?quidos, Teacher, adult education las bebidas alcoh?licas durante 24 horas.  ?  ?ACTIVIDAD:  Debe planear tomarse las cosas con calma por el resto del d?a y no debe CONDUCIR ni usar maquinaria pesada hasta ma?ana (debido a los medicamentos de sedaci?n Furniture conservator/restorer).   ?  ?SEGUIMIENTO: ?Teacher, music? al n?mero que aparece en su historial al siguiente d?a h?bil de su procedimiento para ver c?mo se siente y para responder cualquier pregunta o inquietud que pueda tener con respecto a la informaci?n que se le dio despu?s del procedimiento. Si no podemos contactarle, le dejaremos un mensaje.  Sin embargo, si se siente bien y no tiene ning?n problema, no es necesario que nos devuelva la llamada.  Asumiremos que ha regresado a sus actividades diarias normales sin incidentes. ?Si se le tomaron algunas biopsias, le contactaremos por tel?fono o por carta en las pr?ximas 3 semanas.  Si no ha sabido [employer] biopsias en el transcurso de 3 semanas, por favor ll?menos al [phone].  ? ?FIRMAS/CONFIDENCIALIDAD: ?Usted y/o el acompa?ante que le cuide han firmado documentos que se ingresar?n en su historial m?dico electr?nico.  Estas firmas atestiguan el hecho de que la informaci?n anterior   ?

## 2021-06-16 NOTE — Progress Notes (Signed)
Pt awake, report to RN, VVS  °

## 2021-06-18 ENCOUNTER — Telehealth: Payer: Self-pay | Admitting: *Deleted

## 2021-06-18 NOTE — Telephone Encounter (Signed)
  Follow up Call-     06/16/2021   12:41 PM  Call back number  Post procedure Call Back phone  # [phone]  Permission to leave phone message Yes     Patient questions:  Do you have a fever, pain , or abdominal swelling? No. Pain Score  0 *  Have you tolerated food without any problems? Yes.    Have you been able to return to your normal activities? Yes.    Do you have any questions about your discharge instructions: Diet   No. Medications  No. Follow up visit  No.  Do you have questions or concerns about your Care? No.  Actions: * If pain score is 4 or above: No action needed, pain <4.

## 2021-06-23 ENCOUNTER — Encounter: Payer: Self-pay | Admitting: Gastroenterology

## 2021-09-07 ENCOUNTER — Ambulatory Visit (INDEPENDENT_AMBULATORY_CARE_PROVIDER_SITE_OTHER): Payer: BC Managed Care – PPO | Admitting: Emergency Medicine

## 2021-09-07 ENCOUNTER — Encounter: Payer: Self-pay | Admitting: Emergency Medicine

## 2021-09-07 VITALS — BP 120/60 | HR 71 | Temp 98.4°F | Ht 66.0 in | Wt 240.0 lb

## 2021-09-07 DIAGNOSIS — M1A9XX1 Chronic gout, unspecified, with tophus (tophi): Secondary | ICD-10-CM | POA: Diagnosis not present

## 2021-09-07 DIAGNOSIS — Z1329 Encounter for screening for other suspected endocrine disorder: Secondary | ICD-10-CM

## 2021-09-07 DIAGNOSIS — Z130 Encounter for screening for diseases of the blood and blood-forming organs and certain disorders involving the immune mechanism: Secondary | ICD-10-CM | POA: Diagnosis not present

## 2021-09-07 DIAGNOSIS — Z13220 Encounter for screening for lipoid disorders: Secondary | ICD-10-CM

## 2021-09-07 DIAGNOSIS — Z0000 Encounter for general adult medical examination without abnormal findings: Secondary | ICD-10-CM

## 2021-09-07 DIAGNOSIS — Z125 Encounter for screening for malignant neoplasm of prostate: Secondary | ICD-10-CM

## 2021-09-07 DIAGNOSIS — Z13228 Encounter for screening for other metabolic disorders: Secondary | ICD-10-CM | POA: Diagnosis not present

## 2021-09-07 LAB — PSA: PSA: 0.28 ng/mL (ref 0.10–4.00)

## 2021-09-07 LAB — HEMOGLOBIN A1C: Hgb A1c MFr Bld: 6.3 % (ref 4.6–6.5)

## 2021-09-07 MED ORDER — ALLOPURINOL 100 MG PO TABS: 100.0000 mg | ORAL_TABLET | Freq: Every day | ORAL | 3 refills | Status: DC

## 2021-09-07 MED ORDER — COLCHICINE 0.6 MG PO CAPS: 0.6000 mg | ORAL_CAPSULE | Freq: Every day | ORAL | 3 refills | Status: DC

## 2021-09-07 NOTE — Progress Notes (Signed)
[name] 61 y.o.   Chief Complaint  Patient presents with   Annual Exam    HISTORY OF PRESENT ILLNESS: This is a 61 y.o. male here for annual exam. Doing well.  Has no complaints or medical concerns today.  HPI   Prior to Admission medications   Medication Sig Start Date End Date Taking? Authorizing Provider  sildenafil (VIAGRA) 100 MG tablet Take 1 tablet (100 mg total) by mouth as needed for erectile dysfunction. 08/23/19  Yes Mayreli Alden, Ines Bloomer, MD  allopurinol (ZYLOPRIM) 100 MG tablet Take 1 tablet (100 mg total) by mouth daily. 09/07/21 09/02/22  Horald Pollen, MD  Colchicine 0.6 MG CAPS Take 0.6 mg by mouth daily. Take 1 capsules immediately. Then take 1 capsule two hours later. Then take 1 capsule daily until pain has resolved. 09/07/21 12/06/21  Horald Pollen, MD  indomethacin (INDOCIN) 50 MG capsule Take 1 capsule (50 mg total) by mouth 3 (three) times daily with meals. 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 04/2017 s/p PVI. with ablation 05-06-2017   Atrial flutter ([place]) 2019   with cardioversion   Cataract    left small   GERD (gastroesophageal reflux disease)    past hx   Gout    Pre-diabetes    Sleep apnea    on CPAP"    Past Surgical History:  Procedure Laterality Date   ATRIAL FIBRILLATION ABLATION N/A 05/06/2017   Procedure: ATRIAL FIBRILLATION ABLATION;  Surgeon: Constance Haw, MD;  Location: [facility];  Service: Cardiovascular;  Laterality: N/A;   CARDIOVERSION N/A 03/04/2017   Procedure:  CARDIOVERSION;  Surgeon: Adrian Prows, MD;  Location: [place];  Service: Cardiovascular;  Laterality: N/A;   LEFT HEART CATH AND CORONARY ANGIOGRAPHY N/A 05/24/2017   Procedure: LEFT HEART CATH AND CORONARY ANGIOGRAPHY possible angioplasty;  Surgeon: Nigel Mormon, MD;  Location: [place] [facility];  Service: Cardiovascular;  Laterality: N/A;   TOOTH EXTRACTION  05/2021   [place]   WISDOM TOOTH EXTRACTION     all 4    Social History   Socioeconomic History   Marital status: Married    Spouse name: Not on file   Number of children: 2   Years of education: Not on file   Highest education level: Not on file  Occupational History   Not on file  Tobacco Use   Smoking status: Former    Packs/day: 1.00    Years: 20.00    Total pack years: 20.00    Types: Cigarettes    Quit date: 62    Years since quitting: 25.6   Smokeless tobacco: Never  Vaping Use   Vaping Use: Never used  Substance and Sexual Activity   Alcohol use: Yes    Alcohol/week: 3.0 standard drinks of alcohol    Types: 3 Glasses of wine per week    Comment: occasionally   Drug use: No   Sexual activity: Yes  Other Topics Concern   Not on file  Social History Narrative   **  Merged History Encounter **       Social Determinants of Health   Financial Resource Strain: Not on file  Food Insecurity: Not on file  Transportation Needs: Not on file  Physical Activity: Not on file  Stress: Not on file  Social Connections: Not on file  Intimate Partner Violence: Not on file    Family History  Problem Relation Age of Onset   Diabetes Mother    Hyperlipidemia Mother    Diabetes Father    Hyperlipidemia Father    Esophageal cancer Father    Drug abuse Sister    Pancreatic cancer Sister    Hyperlipidemia Maternal Grandmother    Colon polyps Niece    Colon cancer Neg Hx    Prostate cancer Neg Hx    Rectal cancer Neg Hx    Stomach cancer Neg Hx      Review of Systems  Constitutional: Negative.   Negative for chills and fever.  HENT: Negative.  Negative for congestion and sore throat.   Eyes: Negative.   Respiratory: Negative.  Negative for cough and shortness of breath.   Cardiovascular: Negative.  Negative for chest pain and palpitations.  Gastrointestinal:  Negative for abdominal pain, diarrhea, nausea and vomiting.  Genitourinary: Negative.  Negative for dysuria.  Skin: Negative.  Negative for rash.  Neurological:  Negative for dizziness and headaches.  All other systems reviewed and are negative.  Today's Vitals   09/07/21 1555  BP: 120/60  Pulse: 71  Temp: 98.4 F (36.9 C)  TempSrc: Oral  SpO2: 93%  Weight: 240 lb (108.9 kg)  Height: '5\' 6"'$  (1.676 m)   Body mass index is 38.74 kg/m. Wt Readings from Last 3 Encounters:  09/07/21 240 lb (108.9 kg)  06/16/21 233 lb (105.7 kg)  05/26/21 233 lb (105.7 kg)     Physical Exam Vitals reviewed.  Constitutional:      Appearance: Normal appearance.  HENT:     Head: Normocephalic.     Right Ear: Tympanic membrane, ear canal and external ear normal.     Left Ear: Tympanic membrane, ear canal and external ear normal.     Mouth/Throat:     Mouth: Mucous membranes are moist.     Pharynx: Oropharynx is clear.  Eyes:     Extraocular Movements: Extraocular movements intact.     Conjunctiva/sclera: Conjunctivae normal.     Pupils: Pupils are equal, round, and reactive to light.  Cardiovascular:     Rate and Rhythm: Normal rate and regular rhythm.     Pulses: Normal pulses.     Heart sounds: Normal heart sounds.  Pulmonary:     Effort: Pulmonary effort is normal.     Breath sounds: Normal breath sounds.  Abdominal:     General: Bowel sounds are normal. There is no distension.     Palpations: Abdomen is soft.     Tenderness: There is no abdominal tenderness.  Musculoskeletal:     Cervical back: No tenderness.     Right lower leg: No edema.     Left lower leg: No edema.  Lymphadenopathy:     Cervical: No cervical  adenopathy.  Skin:    General: Skin is warm and dry.     Capillary Refill: Capillary refill takes less than 2 seconds.  Neurological:     General: No focal deficit present.     Mental Status: He is alert and oriented to person, place, and time.  Psychiatric:  Mood and Affect: Mood normal.      ASSESSMENT & PLAN: Problem List Items Addressed This Visit       Musculoskeletal and Integument   Chronic gout with tophus   Relevant Medications   Colchicine 0.6 MG CAPS   allopurinol (ZYLOPRIM) 100 MG tablet   Other Visit Diagnoses     Routine general medical examination at a health care facility    -  Primary   Prostate cancer screening       Relevant Orders   PSA(Must document that pt has been informed of limitations of PSA testing.)   Screening for deficiency anemia       Relevant Orders   CBC with Differential   Screening for lipoid disorders       Relevant Orders   Lipid panel   Screening for endocrine, metabolic and immunity disorder       Relevant Orders   Comprehensive metabolic panel   Hemoglobin A1c      Modifiable risk factors discussed with patient. Anticipatory guidance according to age provided. The following topics were also discussed: Social Determinants of Health Smoking.  Non-smoker Diet and nutrition and need to decrease amount of daily carbohydrate intake and daily calories Benefits of exercise Cancer screening and review of most recent colonoscopy report done last May which showed polyps Vaccinations recommendations Cardiovascular risk assessment and review of all medications Mental health including depression and anxiety Fall and accident prevention  Patient Instructions  Mantenimiento de Technical sales engineer en los [employer], Male Adoptar un estilo de vida saludable y recibir atencin preventiva son importantes para promover la salud y Musician. Consulte al mdico sobre: El esquema adecuado para hacerse pruebas y exmenes  peridicos. Cosas que puede hacer por su cuenta para prevenir enfermedades y [place] sano. Qu debo saber sobre la dieta, el peso y el ejercicio? Consuma una dieta saludable  Consuma una dieta que incluya muchas verduras, frutas, productos lcteos con bajo contenido de [place] y Advertising account planner. No consuma muchos alimentos ricos en grasas slidas, azcares agregados o sodio. Mantenga un peso saludable El ndice de masa muscular [facility]) es una medida que puede utilizarse para identificar posibles problemas de [place]. Proporciona una estimacin de la grasa corporal basndose en el peso y la altura. Su mdico puede ayudarle a Radiation protection practitioner [place] y a Scientist, forensic o Theatre manager un peso saludable. Haga ejercicio con regularidad Haga ejercicio con regularidad. Esta es una de las prcticas ms importantes que puede hacer por su salud. [employer] de los adultos deben seguir estas pautas: Optometrist, al menos, 150 minutos de actividad fsica por semana. El ejercicio debe aumentar la frecuencia cardaca y Nature conservation officer transpirar (ejercicio de intensidad moderada). Hacer ejercicios de fortalecimiento por lo [employer] por semana. Agregue esto a su plan de ejercicio de intensidad moderada. Pase menos tiempo sentado. Incluso la actividad fsica ligera puede ser beneficiosa. Controle sus niveles de colesterol y lpidos en la sangre Comience a realizarse anlisis de lpidos y Research officer, trade union en la sangre a los 67 aos y luego reptalos cada 5 aos. Es posible que Automotive engineer los niveles de colesterol con mayor frecuencia si: Sus niveles de lpidos y colesterol son altos. Es mayor de 65 aos. Presenta un alto riesgo de padecer enfermedades cardacas. Qu debo saber sobre las pruebas de deteccin del cncer? Muchos tipos de cncer pueden detectarse de manera temprana y, a menudo, pueden prevenirse. Segn su historia clnica y sus antecedentes familiares, es posible que deba realizarse pruebas de deteccin del  cncer en  diferentes edades. Esto puede incluir pruebas de deteccin de lo siguiente: Surveyor, minerals. Cncer de prstata. Cncer de piel. Cncer de pulmn. Qu debo saber sobre la enfermedad cardaca, la diabetes y la hipertensin arterial? Presin arterial y enfermedad cardaca La hipertensin arterial causa enfermedades cardacas y [place] el riesgo de accidente cerebrovascular. Es ms probable que esto se manifieste en las personas que tienen lecturas de presin arterial alta o tienen sobrepeso. Hable con el mdico sobre sus valores de presin arterial deseados. Hgase controlar la presin arterial: Cada 3 a 5 aos si tiene entre 18 y 6 aos. Todos los aos si es mayor de 40 aos. Si tiene entre 83 y 77 aos y es fumador o Insurance account manager, pregntele al mdico si debe realizarse una prueba de deteccin de aneurisma artico abdominal (AAA) por nica vez. Diabetes Realcese exmenes de deteccin de la diabetes con regularidad. Este anlisis revisa el nivel de azcar en la sangre en [place]. Hgase las pruebas de deteccin: Cada tres aos despus de los 29 aos de edad si tiene un peso normal y un bajo riesgo de padecer diabetes. Con ms frecuencia y a partir de [place] edad inferior si tiene sobrepeso o un alto riesgo de padecer diabetes. Qu debo saber sobre la prevencin de infecciones? Hepatitis B Si tiene un riesgo ms alto de contraer hepatitis B, debe someterse a un examen de deteccin de este virus. Hable con el mdico para averiguar si tiene riesgo de contraer la infeccin por hepatitis B. Hepatitis C Se recomienda un anlisis de [place] para: Todos los que nacieron entre 1946 y [phone]. Todas las personas que tengan un riesgo de haber contrado hepatitis C. Enfermedades de transmisin sexual (ETS) Debe realizarse pruebas de deteccin de ITS todos los aos, incluidas la gonorrea y la clamidia, si: Es sexualmente activo y es menor de 75 aos. Es mayor de 25 aos, y Investment banker, operational informa que corre  riesgo de tener este tipo de infecciones. La actividad sexual ha cambiado desde que le hicieron la ltima prueba de deteccin y tiene un riesgo mayor de Best boy clamidia o Radio broadcast assistant. Pregntele al mdico si usted tiene riesgo. Pregntele al mdico si usted tiene un alto riesgo de Museum/gallery curator VIH. El mdico tambin puede recomendarle un medicamento recetado para ayudar a evitar la infeccin por el VIH. Si elige tomar medicamentos para prevenir el VIH, primero debe [employer] de deteccin del VIH. Luego debe hacerse anlisis cada 3 meses mientras est tomando los medicamentos. Siga estas indicaciones en su casa: Consumo de alcohol No beba alcohol si el mdico se lo prohbe. Si bebe alcohol: Limite la cantidad que consume de 0 a 2 bebidas por da. Sepa cunta cantidad de alcohol hay en las bebidas que toma. En los [place], una medida equivale a una botella de cerveza de 12 oz (355 ml), un vaso de vino de 5 oz (148 ml) o un vaso de una bebida alcohlica de alta graduacin de 1 oz (44 ml). Estilo de vida No consuma ningn producto que contenga nicotina o tabaco. Estos productos incluyen cigarrillos, tabaco para Higher education careers adviser y aparatos de vapeo, como los Psychologist, sport and exercise. Si necesita ayuda para dejar de consumir estos productos, consulte al mdico. No consuma drogas. No comparta agujas. Solicite ayuda a su mdico si necesita apoyo o informacin para abandonar las drogas. Indicaciones generales Realcese los estudios de rutina de la salud, dentales y de Public librarian. [place]. Infrmele a su mdico si: Se siente  deprimido con frecuencia. Alguna vez ha sido vctima de [place] o no se siente seguro en su casa. Resumen Adoptar un estilo de vida saludable y recibir atencin preventiva son importantes para promover la salud y Musician. Siga las instrucciones del mdico acerca de una dieta saludable, el ejercicio y la realizacin de pruebas o exmenes para Theatre manager. Siga las instrucciones del mdico con respecto al control del colesterol y la presin arterial. Esta informacin no tiene Marine scientist el consejo del mdico. Asegrese de hacerle al mdico cualquier pregunta que tenga. Document Revised: 06/25/2020 Document Reviewed: 06/25/2020 Elsevier Patient Education  [place], MD [facility]

## 2021-09-07 NOTE — Patient Instructions (Signed)
Mantenimiento de la salud en los hombres Health Maintenance, Male Adoptar un estilo de vida saludable y recibir atencin preventiva son importantes para promover la salud y el bienestar. Consulte al mdico sobre: El esquema adecuado para hacerse pruebas y exmenes peridicos. Cosas que puede hacer por su cuenta para prevenir enfermedades y mantenerse sano. Qu debo saber sobre la dieta, el peso y el ejercicio? Consuma una dieta saludable  Consuma una dieta que incluya muchas verduras, frutas, productos lcteos con bajo contenido de grasa y protenas magras. No consuma muchos alimentos ricos en grasas slidas, azcares agregados o sodio. Mantenga un peso saludable El ndice de masa muscular (IMC) es una medida que puede utilizarse para identificar posibles problemas de peso. Proporciona una estimacin de la grasa corporal basndose en el peso y la altura. Su mdico puede ayudarle a determinar su IMC y a lograr o mantener un peso saludable. Haga ejercicio con regularidad Haga ejercicio con regularidad. Esta es una de las prcticas ms importantes que puede hacer por su salud. La mayora de los adultos deben seguir estas pautas: Realizar, al menos, 150 minutos de actividad fsica por semana. El ejercicio debe aumentar la frecuencia cardaca y hacerlo transpirar (ejercicio de intensidad moderada). Hacer ejercicios de fortalecimiento por lo menos dos veces por semana. Agregue esto a su plan de ejercicio de intensidad moderada. Pase menos tiempo sentado. Incluso la actividad fsica ligera puede ser beneficiosa. Controle sus niveles de colesterol y lpidos en la sangre Comience a realizarse anlisis de lpidos y colesterol en la sangre a los 20 aos y luego reptalos cada 5 aos. Es posible que necesite controlar los niveles de colesterol con mayor frecuencia si: Sus niveles de lpidos y colesterol son altos. Es mayor de 40 aos. Presenta un alto riesgo de padecer enfermedades cardacas. Qu debo  saber sobre las pruebas de deteccin del cncer? Muchos tipos de cncer pueden detectarse de manera temprana y, a menudo, pueden prevenirse. Segn su historia clnica y sus antecedentes familiares, es posible que deba realizarse pruebas de deteccin del cncer en diferentes edades. Esto puede incluir pruebas de deteccin de lo siguiente: Cncer colorrectal. Cncer de prstata. Cncer de piel. Cncer de pulmn. Qu debo saber sobre la enfermedad cardaca, la diabetes y la hipertensin arterial? Presin arterial y enfermedad cardaca La hipertensin arterial causa enfermedades cardacas y aumenta el riesgo de accidente cerebrovascular. Es ms probable que esto se manifieste en las personas que tienen lecturas de presin arterial alta o tienen sobrepeso. Hable con el mdico sobre sus valores de presin arterial deseados. Hgase controlar la presin arterial: Cada 3 a 5 aos si tiene entre 18 y 39 aos. Todos los aos si es mayor de 40 aos. Si tiene entre 65 y 75 aos y es fumador o sola fumar, pregntele al mdico si debe realizarse una prueba de deteccin de aneurisma artico abdominal (AAA) por nica vez. Diabetes Realcese exmenes de deteccin de la diabetes con regularidad. Este anlisis revisa el nivel de azcar en la sangre en ayunas. Hgase las pruebas de deteccin: Cada tres aos despus de los 45 aos de edad si tiene un peso normal y un bajo riesgo de padecer diabetes. Con ms frecuencia y a partir de una edad inferior si tiene sobrepeso o un alto riesgo de padecer diabetes. Qu debo saber sobre la prevencin de infecciones? Hepatitis B Si tiene un riesgo ms alto de contraer hepatitis B, debe someterse a un examen de deteccin de este virus. Hable con el mdico para averiguar si tiene riesgo de   contraer la infeccin por hepatitis B. Hepatitis C Se recomienda un anlisis de sangre para: Todos los que nacieron entre 1945 y 1965. Todas las personas que tengan un riesgo de haber  contrado hepatitis C. Enfermedades de transmisin sexual (ETS) Debe realizarse pruebas de deteccin de ITS todos los aos, incluidas la gonorrea y la clamidia, si: Es sexualmente activo y es menor de 24 aos. Es mayor de 24 aos, y el mdico le informa que corre riesgo de tener este tipo de infecciones. La actividad sexual ha cambiado desde que le hicieron la ltima prueba de deteccin y tiene un riesgo mayor de tener clamidia o gonorrea. Pregntele al mdico si usted tiene riesgo. Pregntele al mdico si usted tiene un alto riesgo de contraer VIH. El mdico tambin puede recomendarle un medicamento recetado para ayudar a evitar la infeccin por el VIH. Si elige tomar medicamentos para prevenir el VIH, primero debe hacerse los anlisis de deteccin del VIH. Luego debe hacerse anlisis cada 3 meses mientras est tomando los medicamentos. Siga estas indicaciones en su casa: Consumo de alcohol No beba alcohol si el mdico se lo prohbe. Si bebe alcohol: Limite la cantidad que consume de 0 a 2 bebidas por da. Sepa cunta cantidad de alcohol hay en las bebidas que toma. En los [place], una medida equivale a una botella de cerveza de 12 oz (355 ml), un vaso de vino de 5 oz (148 ml) o un vaso de una bebida alcohlica de alta graduacin de 1 oz (44 ml). Estilo de vida No consuma ningn producto que contenga nicotina o tabaco. Estos productos incluyen cigarrillos, tabaco para mascar y aparatos de vapeo, como los cigarrillos electrnicos. Si necesita ayuda para dejar de consumir estos productos, consulte al mdico. No consuma drogas. No comparta agujas. Solicite ayuda a su mdico si necesita apoyo o informacin para abandonar las drogas. Indicaciones generales Realcese los estudios de rutina de la salud, dentales y de la vista. Mantngase al da con las vacunas. Infrmele a su mdico si: Se siente deprimido con frecuencia. Alguna vez ha sido vctima de maltrato o no se siente seguro en su  casa. Resumen Adoptar un estilo de vida saludable y recibir atencin preventiva son importantes para promover la salud y el bienestar. Siga las instrucciones del mdico acerca de una dieta saludable, el ejercicio y la realizacin de pruebas o exmenes para detectar enfermedades. Siga las instrucciones del mdico con respecto al control del colesterol y la presin arterial. Esta informacin no tiene como fin reemplazar el consejo del mdico. Asegrese de hacerle al mdico cualquier pregunta que tenga. Document Revised: 06/25/2020 Document Reviewed: 06/25/2020 Elsevier Patient Education  2023 Elsevier Inc.  

## 2021-09-08 LAB — CBC WITH DIFFERENTIAL/PLATELET
Basophils Absolute: 0.1 10*3/uL (ref 0.0–0.1)
Basophils Relative: 0.8 % (ref 0.0–3.0)
Eosinophils Absolute: 0 10*3/uL (ref 0.0–0.7)
Eosinophils Relative: 0.6 % (ref 0.0–5.0)
HCT: 42.4 % (ref 39.0–52.0)
Hemoglobin: 14.2 g/dL (ref 13.0–17.0)
Lymphocytes Relative: 34.7 % (ref 12.0–46.0)
Lymphs Abs: 2.7 10*3/uL (ref 0.7–4.0)
MCHC: 33.4 g/dL (ref 30.0–36.0)
MCV: 102 fl — ABNORMAL HIGH (ref 78.0–100.0)
Monocytes Absolute: 0.6 10*3/uL (ref 0.1–1.0)
Monocytes Relative: 8.2 % (ref 3.0–12.0)
Neutro Abs: 4.3 10*3/uL (ref 1.4–7.7)
Neutrophils Relative %: 55.7 % (ref 43.0–77.0)
Platelets: 227 10*3/uL (ref 150.0–400.0)
RBC: 4.15 Mil/uL — ABNORMAL LOW (ref 4.22–5.81)
RDW: 13.7 % (ref 11.5–15.5)
WBC: 7.7 10*3/uL (ref 4.0–10.5)

## 2021-09-08 LAB — COMPREHENSIVE METABOLIC PANEL
ALT: 46 U/L (ref 0–53)
AST: 37 U/L (ref 0–37)
Albumin: 4.1 g/dL (ref 3.5–5.2)
Alkaline Phosphatase: 50 U/L (ref 39–117)
BUN: 16 mg/dL (ref 6–23)
CO2: 25 mEq/L (ref 19–32)
Calcium: 8.9 mg/dL (ref 8.4–10.5)
Chloride: 110 mEq/L (ref 96–112)
Creatinine, Ser: 1.47 mg/dL (ref 0.40–1.50)
GFR: 50.94 mL/min — ABNORMAL LOW (ref >60.00)
Glucose, Bld: 93 mg/dL (ref 70–99)
Potassium: 4.2 mEq/L (ref 3.5–5.1)
Sodium: 143 mEq/L (ref 135–145)
Total Bilirubin: 0.4 mg/dL (ref 0.2–1.2)
Total Protein: 7.2 g/dL (ref 6.0–8.3)

## 2021-09-08 LAB — LIPID PANEL
Cholesterol: 162 mg/dL (ref 0–200)
HDL: 36.8 mg/dL — ABNORMAL LOW (ref >39.00)
NonHDL: 125.04
Total CHOL/HDL Ratio: 4
Triglycerides: 220 mg/dL — ABNORMAL HIGH (ref 0.0–149.0)
VLDL: 44 mg/dL — ABNORMAL HIGH (ref 0.0–40.0)

## 2021-09-08 LAB — LDL CHOLESTEROL, DIRECT: Direct LDL: 105 mg/dL

## 2021-10-28 NOTE — Progress Notes (Unsigned)
PATIENT: Evan Page DOB: 1960-12-10  REASON FOR VISIT: follow up HISTORY FROM: patient  No chief complaint on file.    HISTORY OF PRESENT ILLNESS:  10/28/21 ALL:  Evan Page is a 61 y.o. male here today for follow up for OSA on CPAP.      HISTORY: (copied from Dr Guadelupe Sabin previous note)  Evan Page is a 61 year old right-handed gentleman with an underlying medical history of atrial fibrillation, hyperlipidemia, and obesity, who presents for follow-up consultation of his obstructive sleep apnea, on CPAP therapy. The patient is accompanied by a Spanish interpreter today. I last saw him on 10/29/2019, at which time he was compliant with his CPAP.  He was using an ozone based CPAP cleaner.  He was up-to-date with his supplies.  He was recently found to have diverticulosis.  He had a checkup with his cardiologist and was deemed stable.   Today, 10/29/2020: I reviewed his CPAP compliance data from 09/28/2020 through 10/27/2020, which is a total of 30 days, during which time he used his machine every night with percent use days greater than 4 hours at 97%, indicating excellent compliance with an average usage of 6 hours and 56 minutes, residual AHI at goal at 0.3/h, leak along with a 95th percentile at 0 L/min on a pressure of 9 cm with EPR of 3.  He reports doing well with his CPAP.  He is fully compliant with it and does not skip it, with the exception that he had to skip it a couple of times in the recent past due to power outage at his house.  His wife commented that he does not snore as heavily as he used to.  He is up-to-date with his supplies but has not changed the water chamber since he got the machine some 3 years ago.  He has heard about inspire and is curious about it, we talked about this at length today.  He is wondering if he is a candidate.  He was offered a referral to ENT but declined at at this time, indicating that he has not had any  difficulty tolerating his CPAP and he can continue with it for now.   He presented to the emergency room on 07/05/2020 with an acute gout flare.  I reviewed the emergency room records.  He was given a prescription for colchicine and indomethacin.   He is no longer taking his colchicine and indomethacin, his primary care physician has not put him on allopurinol.  He did have a blood test in August 2022.   REVIEW OF SYSTEMS: Out of a complete 14 system review of symptoms, the patient complains only of the following symptoms, and all other reviewed systems are negative.  ESS:  ALLERGIES: No Known Allergies  HOME MEDICATIONS: Outpatient Medications Prior to Visit  Medication Sig Dispense Refill   allopurinol (ZYLOPRIM) 100 MG tablet Take 1 tablet (100 mg total) by mouth daily. 90 tablet 3   Colchicine 0.6 MG CAPS Take 0.6 mg by mouth daily. Take 1 capsules immediately. Then take 1 capsule two hours later. Then take 1 capsule daily until pain has resolved. 90 capsule 3   indomethacin (INDOCIN) 50 MG capsule Take 1 capsule (50 mg total) by mouth 3 (three) times daily with meals. (Patient not taking: Reported on 06/16/2021) 9 capsule 0   sildenafil (VIAGRA) 100 MG tablet Take 1 tablet (100 mg total) by mouth as needed for erectile dysfunction. 6 tablet 19   No facility-administered medications  prior to visit.    PAST MEDICAL HISTORY: Past Medical History:  Diagnosis Date   Atrial fibrillation ([place])    a. 04/2017 s/p PVI. with ablation 05-06-2017   Atrial flutter ([place]) 2019   with cardioversion   Cataract    left small   GERD (gastroesophageal reflux disease)    past hx   Gout    Pre-diabetes    Sleep apnea    on CPAP"    PAST SURGICAL HISTORY: Past Surgical History:  Procedure Laterality Date   ATRIAL FIBRILLATION ABLATION N/A 05/06/2017   Procedure: ATRIAL FIBRILLATION ABLATION;  Surgeon: Constance Haw, MD;  Location: [facility];  Service: Cardiovascular;  Laterality:  N/A;   CARDIOVERSION N/A 03/04/2017   Procedure: CARDIOVERSION;  Surgeon: Adrian Prows, MD;  Location: [facility];  Service: Cardiovascular;  Laterality: N/A;   LEFT HEART CATH AND CORONARY ANGIOGRAPHY N/A 05/24/2017   Procedure: LEFT HEART CATH AND CORONARY ANGIOGRAPHY possible angioplasty;  Surgeon: Nigel Mormon, MD;  Location: [facility];  Service: Cardiovascular;  Laterality: N/A;   TOOTH EXTRACTION  05/2021   [place]   WISDOM TOOTH EXTRACTION     all 4    FAMILY HISTORY: Family History  Problem Relation Age of Onset   Diabetes Mother    Hyperlipidemia Mother    Diabetes Father    Hyperlipidemia Father    Esophageal cancer Father    Drug abuse Sister    Pancreatic cancer Sister    Hyperlipidemia Maternal Grandmother    Colon polyps Niece    Colon cancer Neg Hx    Prostate cancer Neg Hx    Rectal cancer Neg Hx    Stomach cancer Neg Hx     SOCIAL HISTORY: Social History   Socioeconomic History   Marital status: Married    Spouse name: Not on file   Number of children: 2   Years of education: Not on file   Highest education level: Not on file  Occupational History   Not on file  Tobacco Use   Smoking status: Former    Packs/day: 1.00    Years: 20.00    Total pack years: 20.00    Types: Cigarettes    Quit date: 75    Years since quitting: 25.7   Smokeless tobacco: Never  Vaping Use   Vaping Use: Never used  Substance and Sexual Activity   Alcohol use: Yes    Alcohol/week: 3.0 standard drinks of alcohol    Types: 3 Glasses of wine per week    Comment: occasionally   Drug use: No   Sexual activity: Yes  Other Topics Concern   Not on file  Social History Narrative   ** Merged History Encounter **       Social Determinants of Health   Financial Resource Strain: Not on file  Food Insecurity: Not on file  Transportation Needs: Not on file  Physical Activity: Not on file  Stress: Not on file  Social Connections: Not on file  Intimate  Partner Violence: Not on file     PHYSICAL EXAM  There were no vitals filed for this visit. There is no height or weight on file to calculate BMI.  Generalized: Well developed, in no acute distress  Cardiology: normal rate and rhythm, no murmur noted Respiratory: clear to auscultation bilaterally  Neurological examination  Mentation: Alert oriented to time, place, history taking. Follows all commands speech and language fluent Cranial nerve II-XII: Pupils were equal round reactive  to light. Extraocular movements were full, visual field were full  Motor: The motor testing reveals 5 over 5 strength of all 4 extremities. Good symmetric motor tone is noted throughout.  Gait and station: Gait is normal.    DIAGNOSTIC DATA (LABS, IMAGING, TESTING) - I reviewed patient records, labs, notes, testing and imaging myself where available.      No data to display           Lab Results  Component Value Date   WBC 7.7 09/07/2021   HGB 14.2 09/07/2021   HCT 42.4 09/07/2021   MCV 102.0 (H) 09/07/2021   PLT 227.0 09/07/2021      Component Value Date/Time   NA 143 09/07/2021 1629   NA 143 08/09/2019 1500   K 4.2 09/07/2021 1629   CL 110 09/07/2021 1629   CO2 25 09/07/2021 1629   GLUCOSE 93 09/07/2021 1629   BUN 16 09/07/2021 1629   BUN 18 08/09/2019 1500   CREATININE 1.47 09/07/2021 1629   CREATININE 1.52 (H) 10/20/2015 1651   CALCIUM 8.9 09/07/2021 1629   PROT 7.2 09/07/2021 1629   PROT 7.5 08/09/2019 1500   ALBUMIN 4.1 09/07/2021 1629   ALBUMIN 4.4 08/09/2019 1500   AST 37 09/07/2021 1629   ALT 46 09/07/2021 1629   ALKPHOS 50 09/07/2021 1629   BILITOT 0.4 09/07/2021 1629   BILITOT 0.4 08/09/2019 1500   GFRNONAA 58 (L) 08/09/2019 1500   GFRNONAA 50 (L) 10/20/2015 1651   GFRAA 67 08/09/2019 1500   GFRAA 58 (L) 10/20/2015 1651   Lab Results  Component Value Date   CHOL 162 09/07/2021   HDL 36.80 (L) 09/07/2021   LDLCALC 76 09/04/2020   LDLDIRECT 105.0 09/07/2021    TRIG 220.0 (H) 09/07/2021   CHOLHDL 4 09/07/2021   Lab Results  Component Value Date   HGBA1C 6.3 09/07/2021   Lab Results  Component Value Date   VITAMINB12 555 02/11/2017   Lab Results  Component Value Date   TSH 2.62 11/13/2020     ASSESSMENT AND PLAN 61 y.o. year old male  has a past medical history of Atrial fibrillation ([place]), Atrial flutter ([place]) (2019), Cataract, GERD (gastroesophageal reflux disease), Gout, Pre-diabetes, and Sleep apnea. here with   No diagnosis found.    [facility] is doing well on CPAP therapy. Compliance report reveals ***. *** was encouraged to continue using CPAP nightly and for greater than 4 hours each night. We will update supply orders as indicated. Risks of untreated sleep apnea review and education materials provided. Healthy lifestyle habits encouraged. *** will follow up in ***, sooner if needed. *** verbalizes understanding and agreement with this plan.    No orders of the defined types were placed in this encounter.    No orders of the defined types were placed in this encounter.     Debbora Presto, FNP-C 10/28/2021, 4:28 PM [facility] [address], [place], [place] [address] [phone]

## 2021-10-28 NOTE — Patient Instructions (Incomplete)
Please continue using your CPAP regularly. While your insurance requires that you use CPAP at least 4 hours each night on 70% of the nights, I recommend, that you not skip any nights and use it throughout the night if you can. Getting used to CPAP and staying with the treatment long term does take time and patience and discipline. Untreated obstructive sleep apnea when it is moderate to severe can have an adverse impact on cardiovascular health and raise her risk for heart disease, arrhythmias, hypertension, congestive heart failure, stroke and diabetes. Untreated obstructive sleep apnea causes sleep disruption, nonrestorative sleep, and sleep deprivation. This can have an impact on your day to day functioning and cause daytime sleepiness and impairment of cognitive function, memory loss, mood disturbance, and problems focussing. Using CPAP regularly can improve these symptoms.   We can look into getting a new machine in April 2024 if you wish.   DME: [facility] Phone: [phone]  We will see you back in 1 year

## 2021-10-29 ENCOUNTER — Encounter: Payer: Self-pay | Admitting: Family Medicine

## 2021-10-29 ENCOUNTER — Ambulatory Visit: Payer: BC Managed Care – PPO | Admitting: Family Medicine

## 2021-10-29 VITALS — BP 146/72 | HR 65 | Ht 67.0 in | Wt 238.0 lb

## 2021-10-29 DIAGNOSIS — G4733 Obstructive sleep apnea (adult) (pediatric): Secondary | ICD-10-CM | POA: Diagnosis not present

## 2021-10-29 DIAGNOSIS — Z9989 Dependence on other enabling machines and devices: Secondary | ICD-10-CM

## 2021-10-30 ENCOUNTER — Telehealth: Payer: Self-pay

## 2021-11-23 ENCOUNTER — Ambulatory Visit: Payer: BC Managed Care – PPO | Admitting: Internal Medicine

## 2021-11-23 ENCOUNTER — Encounter: Payer: Self-pay | Admitting: Internal Medicine

## 2021-11-23 DIAGNOSIS — R609 Edema, unspecified: Secondary | ICD-10-CM | POA: Diagnosis not present

## 2021-11-23 DIAGNOSIS — M1A9XX1 Chronic gout, unspecified, with tophus (tophi): Secondary | ICD-10-CM

## 2021-11-23 DIAGNOSIS — I10 Essential (primary) hypertension: Secondary | ICD-10-CM | POA: Diagnosis not present

## 2021-11-23 DIAGNOSIS — I152 Hypertension secondary to endocrine disorders: Secondary | ICD-10-CM | POA: Insufficient documentation

## 2021-11-23 MED ORDER — FUROSEMIDE 20 MG PO TABS: 20.0000 mg | ORAL_TABLET | Freq: Every day | ORAL | 0 refills | Status: DC | PRN

## 2021-11-23 MED ORDER — LOSARTAN POTASSIUM 50 MG PO TABS: 50.0000 mg | ORAL_TABLET | Freq: Every day | ORAL | 3 refills | Status: DC

## 2021-11-23 NOTE — Progress Notes (Signed)
Subjective:  Patient ID: [facility], male    DOB: 09-05-1960  Age: 61 y.o. MRN: [id number]  CC: Hypertension (Pt states his BP been running high. This morning at 9:00 BP was 159/80 checked again at 11:00 it was 148/75)   HPI [facility] presents for 3 wks of elevated blood pressure, ankle swelling.  He is here with his wife  Outpatient Medications Prior to Visit  Medication Sig Dispense Refill   allopurinol (ZYLOPRIM) 100 MG tablet Take 1 tablet (100 mg total) by mouth daily. 90 tablet 3   Colchicine 0.6 MG CAPS Take 0.6 mg by mouth daily. Take 1 capsules immediately. Then take 1 capsule two hours later. Then take 1 capsule daily until pain has resolved. 90 capsule 3   sildenafil (VIAGRA) 100 MG tablet Take 1 tablet (100 mg total) by mouth as needed for erectile dysfunction. 6 tablet 19   indomethacin (INDOCIN) 50 MG capsule Take 1 capsule (50 mg total) by mouth 3 (three) times daily with meals. (Patient not taking: Reported on 11/23/2021) 9 capsule 0   No facility-administered medications prior to visit.    ROS: Review of Systems  Constitutional:  Negative for appetite change, fatigue and unexpected weight change.  HENT:  Negative for congestion, nosebleeds, sneezing, sore throat and trouble swallowing.   Eyes:  Negative for itching and visual disturbance.  Respiratory:  Negative for cough.   Cardiovascular:  Negative for chest pain, palpitations and leg swelling.  Gastrointestinal:  Negative for abdominal distention, blood in stool, diarrhea and nausea.  Genitourinary:  Negative for frequency and hematuria.  Musculoskeletal:  Negative for back pain, gait problem, joint swelling and neck pain.  Skin:  Negative for rash.  Neurological:  Negative for dizziness, tremors, speech difficulty and weakness.  Psychiatric/Behavioral:  Negative for agitation, dysphoric mood and sleep disturbance. The patient is not nervous/anxious.     Objective:  BP (!)  142/70 (BP Location: Left Arm)   Pulse (!) 59   Temp 98.2 F (36.8 C) (Oral)   Ht '5\' 7"'$  (1.702 m)   Wt 236 lb 6.4 oz (107.2 kg)   SpO2 96%   BMI 37.03 kg/m   BP Readings from Last 3 Encounters:  11/23/21 (!) 142/70  10/29/21 (!) 146/72  09/07/21 120/60    Wt Readings from Last 3 Encounters:  11/23/21 236 lb 6.4 oz (107.2 kg)  10/29/21 238 lb (108 kg)  09/07/21 240 lb (108.9 kg)    Physical Exam Constitutional:      General: He is not in acute distress.    Appearance: He is well-developed. He is obese.     Comments: NAD  Eyes:     Conjunctiva/sclera: Conjunctivae normal.     Pupils: Pupils are equal, round, and reactive to light.  Neck:     Thyroid: No thyromegaly.     Vascular: No JVD.  Cardiovascular:     Rate and Rhythm: Normal rate and regular rhythm.     Heart sounds: Normal heart sounds. No murmur heard.    No friction rub. No gallop.  Pulmonary:     Effort: Pulmonary effort is normal. No respiratory distress.     Breath sounds: Normal breath sounds. No wheezing or rales.  Chest:     Chest wall: No tenderness.  Abdominal:     General: Bowel sounds are normal. There is no distension.     Palpations: Abdomen is soft. There is no mass.     Tenderness: There is no abdominal  tenderness. There is no guarding or rebound.  Musculoskeletal:        General: No tenderness. Normal range of motion.     Cervical back: Normal range of motion.  Lymphadenopathy:     Cervical: No cervical adenopathy.  Skin:    General: Skin is warm and dry.     Findings: No rash.  Neurological:     Mental Status: He is alert and oriented to person, place, and time.     Cranial Nerves: No cranial nerve deficit.     Motor: No abnormal muscle tone.     Coordination: Coordination normal.     Gait: Gait normal.     Deep Tendon Reflexes: Reflexes are normal and symmetric.  Psychiatric:        Behavior: Behavior normal.        Thought Content: Thought content normal.        Judgment:  Judgment normal.   Off ankles with trace edema  Lab Results  Component Value Date   WBC 7.7 09/07/2021   HGB 14.2 09/07/2021   HCT 42.4 09/07/2021   PLT 227.0 09/07/2021   GLUCOSE 93 09/07/2021   CHOL 162 09/07/2021   TRIG 220.0 (H) 09/07/2021   HDL 36.80 (L) 09/07/2021   LDLDIRECT 105.0 09/07/2021   LDLCALC 76 09/04/2020   ALT 46 09/07/2021   AST 37 09/07/2021   NA 143 09/07/2021   K 4.2 09/07/2021   CL 110 09/07/2021   CREATININE 1.47 09/07/2021   BUN 16 09/07/2021   CO2 25 09/07/2021   TSH 2.62 11/13/2020   PSA 0.28 09/07/2021   INR 1.17 05/24/2017   HGBA1C 6.3 09/07/2021    No results found.  Assessment & Plan:   Problem List Items Addressed This Visit     Chronic gout with tophus    No recent relapse.  Continue with colchicine as needed and allopurinol daily      Edema    B ankles - recurrent/chronic Start Furosemide prn Labs RTC to see Dr Mitchel Honour      HTN (hypertension)    Start Losartan po NAS diet      Relevant Medications   furosemide (LASIX) 20 MG tablet   losartan (COZAAR) 50 MG tablet      Meds ordered this encounter  Medications   furosemide (LASIX) 20 MG tablet    Sig: Take 1 tablet (20 mg total) by mouth daily as needed.    Dispense:  90 tablet    Refill:  0   losartan (COZAAR) 50 MG tablet    Sig: Take 1 tablet (50 mg total) by mouth daily.    Dispense:  90 tablet    Refill:  3      Follow-up: Return in about 4 weeks (around 12/21/2021) for a follow-up visit.  Walker Kehr, MD

## 2021-11-23 NOTE — Assessment & Plan Note (Addendum)
Start Losartan po NAS diet

## 2021-11-23 NOTE — Assessment & Plan Note (Signed)
B ankles - recurrent/chronic Start Furosemide prn Labs RTC to see Dr Mitchel Honour

## 2021-11-29 NOTE — Assessment & Plan Note (Signed)
No recent relapse.  Continue with colchicine as needed and allopurinol daily

## 2022-05-25 ENCOUNTER — Ambulatory Visit: Payer: BC Managed Care – PPO | Admitting: Neurology

## 2022-05-25 VITALS — BP 144/64 | HR 53 | Ht 67.0 in | Wt 237.8 lb

## 2022-05-25 DIAGNOSIS — G4733 Obstructive sleep apnea (adult) (pediatric): Secondary | ICD-10-CM | POA: Diagnosis not present

## 2022-05-25 NOTE — Patient Instructions (Signed)
Please continue using your CPAP regularly. While your insurance requires that you use CPAP at least 4 hours each night on 70% of the nights, I recommend, that you not skip any nights and use it throughout the night if you can. Getting used to CPAP and staying with the treatment long term does take time and patience and discipline. Untreated obstructive sleep apnea when it is moderate to severe can have an adverse impact on cardiovascular health and raise her risk for heart disease, arrhythmias, hypertension, congestive heart failure, stroke and diabetes. Untreated obstructive sleep apnea causes sleep disruption, nonrestorative sleep, and sleep deprivation. This can have an impact on your day to day functioning and cause daytime sleepiness and impairment of cognitive function, memory loss, mood disturbance, and problems focussing. Using CPAP regularly can improve these symptoms. We can see you in 1 year, you can see one of our nurse practitioners as you are stable. Since your machine is working well, you don't have to get a new machine at this time. We can do a home sleep test next year and get you a new machine then.

## 2022-05-25 NOTE — Progress Notes (Signed)
Subjective:    Patient ID: Camarion Weier is a 62 y.o. male.  HPI    Interim history:  Mr. Dunn is a 62 year old right-handed gentleman with an underlying medical history of atrial fibrillation, hyperlipidemia, and obesity, who presents for follow-up consultation of his obstructive sleep apnea, on CPAP therapy. The patient is accompanied by a [place] interpreter, Byrd Hesselbach today, but most of her visit we did not have to rely on the interpreter.  His English is good. He presents for a sooner than scheduled appointment.  I last saw him on 10/29/2020, at which time he was compliant with his CPAP of 9 cm with EPR of 3.  He reported doing well.  We talked about inspire and I explained this treatment option to him as he had several questions.  He reported a recent gout flareup in June 2022.  Today, 05/25/2022: I reviewed his CPAP compliance data from 04/25/2022 through 05/24/2022 which is a total of 30 days, during which time he used his machine every night with percent he was days greater than 4 hours at 100%, indicating superb compliance with an average usage of 7 hours and 9 minutes, residual AHI at goal at 0.4/h.  Leak on the low side with a 95th percentile at 0 L/min, pressure at 9 cm with EPR of 3.  He reports that he was told to come back in April for a new machine.  He has no problem with his current machine, set up date was 05/30/2017.  He does not necessarily want a new machine, it is working well and he has no concerns.  He was under the impression after the last visit that he was supposed to come back in April.  He does have a 1 year follow-up pending with the nurse practitioner and realizes this.  It may have been a miscommunication and I explained to him that if he wants a new machine we can certainly pursue it, he should be eligible by late April 2024 but if he is not keen on getting a new machine right away, we can also wait and let him continue using the current machine and  consider a home sleep test next year for reevaluation and getting him a machine next year.  He is planning a trip to [place] in the near future and is wondering if he should get a travel machine or take this machine.  He is advised that he can certainly travel with his home CPAP machine.  The patient's allergies, current medications, family history, past medical history, past social history, past surgical history and problem list were reviewed and updated as appropriate.    Previously:     He saw Shawnie Dapper, NP on 10/29/2021, at which time he was fully compliant with his PAP machine and doing well.  He was advised to follow-up routinely in 1 year.   I saw him on 10/29/2019, at which time he was compliant with his CPAP.  He was using an ozone based CPAP cleaner.  He was up-to-date with his supplies.  He was recently found to have diverticulosis.  He had a checkup with his cardiologist and was deemed stable.   I saw him on 10/20/2017, at which time he was compliant with his CPAP and reported feeling better.  He had a recent ablation procedure for his A. fib as well as cardioversion.  He did not have an appointment last year.   I reviewed his CPAP compliance data from 09/25/2019 through 10/24/2019, which is a  total of 30 days, during which time he used his machine every night with percent use days greater than 4 hours at 100%, indicating superb compliance, average usage of 7 hours and 12 minutes, residual AHI at goal at 0.5/h, leak on the low side with a 95th percentile at 1.4 L/min, pressure of 9 cm with EPR of 3.     I first met him on 03/10/2017 at the request of his cardiologist, at which time patient reported snoring and daytime somnolence as well as breathing pauses while asleep. He was advised to proceed with a sleep study. He had a split-night sleep study on 04/21/2017. I went over his test results with him in detail today. Sleep efficiency was 89% at baseline with a sleep latency of 8.5 minutes and REM  latency of 54.5 minutes. He had a total AHI of 38.6 per hour, EKG showed PVCs and A. fib. His REM AHI was 64 per hour, supine AHI 60.6 per hour, average oxygen saturation of only 88% with a nadir of 67%, he had no significant PLMS. He was started on CPAP at 5 cm via nasal pillows and advanced 8 cm, and the final pressure his AHI was 2.2 per hour with nonsupine REM sleep achieved an O2 nadir of 89%. I suggested a home treatment pressure of 9 cm. I reviewed his CPAP compliance data from 09/19/2017 through 10/18/2017 which is a total of 30 days, during which time he used his CPAP 29 days with percent used days greater than 4 hours at 97%, indicating excellent compliance with an average usage of 6 hours and 30 minutes, residual AHI at goal at 1.4 per hour, leak acceptable on the low side with the 95th percentile at 5.2 L/m on a pressure of 9 cm with EPR of 3.      03/10/2017: (He) reports snoring and excessive daytime somnolence. I reviewed your office note from 02/13/2017, which you kindly included. His Epworth sleepiness score is 20 out of 24 today, fatigue score is 58 out of 63. He reports loud snoring and also positive in his breathing per girlfriend report. He lives alone, girlfriend lives in [place]. He has one son and one daughter. His son lives with them along with his family which includes 2 children. Patient bedtime is around 9, rise time around 4. He has to be at work at 5. 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 He has nocturia about twice per average night. He has had occasional morning headaches. He drinks caffeine in the form of coffee, usually just one cup per day, typically no sodas. He quit smoking some 20 years ago. He does drink occasional wine, 1-2 glasses, namely on weekends, not daily. Of note, the patient had a elective cardioversion on 03/04/2017. I reviewed the records. He was in sinus rhythm after his cardioversion.  His Past Medical History Is Significant For: Past  Medical History:  Diagnosis Date   Atrial fibrillation (HCC)    a. 04/2017 s/p PVI. with ablation 05-06-2017   Atrial flutter (HCC) 2019   with cardioversion   Cataract    left small   GERD (gastroesophageal reflux disease)    past hx   Gout    Pre-diabetes    Sleep apnea    on CPAP"    His Past Surgical History Is Significant For: Past Surgical History:  Procedure Laterality Date   ATRIAL FIBRILLATION ABLATION N/A 05/06/2017   Procedure: ATRIAL FIBRILLATION ABLATION;  Surgeon: Regan Lemming, MD;  Location: [facility] INVASIVE CV LAB;  Service: Cardiovascular;  Laterality: N/A;   CARDIOVERSION N/A 03/04/2017   Procedure: CARDIOVERSION;  Surgeon: Yates Decamp, MD;  Location: [facility] ENDOSCOPY;  Service: Cardiovascular;  Laterality: N/A;   LEFT HEART CATH AND CORONARY ANGIOGRAPHY N/A 05/24/2017   Procedure: LEFT HEART CATH AND CORONARY ANGIOGRAPHY possible angioplasty;  Surgeon: Elder Negus, MD;  Location: [facility] INVASIVE CV LAB;  Service: Cardiovascular;  Laterality: N/A;   TOOTH EXTRACTION  05/2021   [facility]   WISDOM TOOTH EXTRACTION     all 4    His Family History Is Significant For: Family History  Problem Relation Age of Onset   Diabetes Mother    Hyperlipidemia Mother    Diabetes Father    Hyperlipidemia Father    Esophageal cancer Father    Drug abuse Sister    Pancreatic cancer Sister    Hyperlipidemia Maternal Grandmother    Colon polyps Niece    Colon cancer Neg Hx    Prostate cancer Neg Hx    Rectal cancer Neg Hx    Stomach cancer Neg Hx     His Social History Is Significant For: Social History   Socioeconomic History   Marital status: Married    Spouse name: Not on file   Number of children: 2   Years of education: Not on file   Highest education level: Not on file  Occupational History   Not on file  Tobacco Use   Smoking status: Former    Packs/day: 1.00    Years: 20.00    Additional pack years: 0.00    Total pack years: 20.00    Types: Cigarettes     Quit date: 2    Years since quitting: 26.3   Smokeless tobacco: Never  Vaping Use   Vaping Use: Never used  Substance and Sexual Activity   Alcohol use: Yes    Alcohol/week: 3.0 standard drinks of alcohol    Types: 3 Glasses of wine per week    Comment: occasionally   Drug use: No   Sexual activity: Yes  Other Topics Concern   Not on file  Social History Narrative   ** Merged History Encounter **       Social Determinants of Health   Financial Resource Strain: Not on file  Food Insecurity: Not on file  Transportation Needs: Not on file  Physical Activity: Not on file  Stress: Not on file  Social Connections: Not on file    His Allergies Are:  No Known Allergies:   His Current Medications Are:  Outpatient Encounter Medications as of 05/25/2022  Medication Sig   sildenafil (VIAGRA) 100 MG tablet Take 1 tablet (100 mg total) by mouth as needed for erectile dysfunction.   furosemide (LASIX) 20 MG tablet Take 1 tablet (20 mg total) by mouth daily as needed. (Patient not taking: Reported on 05/25/2022)   losartan (COZAAR) 50 MG tablet Take 1 tablet (50 mg total) by mouth daily. (Patient not taking: Reported on 05/25/2022)   [DISCONTINUED] allopurinol (ZYLOPRIM) 100 MG tablet Take 1 tablet (100 mg total) by mouth daily.   [DISCONTINUED] Colchicine 0.6 MG CAPS Take 0.6 mg by mouth daily. Take 1 capsules immediately. Then take 1 capsule two hours later. Then take 1 capsule daily until pain has resolved.   No facility-administered encounter medications on file as of 05/25/2022.  :  Review of Systems:  Out of a complete 14 point review of systems, all are reviewed and negative with the exception  of these symptoms as listed below:  Review of Systems  Neurological:        Follow up cpap.  Wants new machine.  ESS- 12.    Objective:  Neurological Exam  Physical Exam  Physical Examination:   Vitals:   05/25/22 1001  BP: (!) 144/64  Pulse: (!) 53    General  Examination: The patient is a very pleasant 62 y.o. male in no acute distress. He appears well-developed and well-nourished and well groomed.   HEENT: Normocephalic, atraumatic, pupils are equal, round and reactive to light, corrective eyeglasses in place. Extraocular tracking is well-preserved, no obvious nystagmus noted.  Hearing is grossly intact.  Face is symmetric with normal facial animation. Speech is clear with no dysarthria noted. There is no hypophonia. There is no lip, neck/head, jaw or voice tremor. Neck is supple with full range of passive and active motion. Oropharynx exam reveals: mild mouth dryness, good dental hygiene and moderate airway crowding. Tongue protrudes centrally and palate elevates symmetrically, overall stable exam.  Chest: Clear to auscultation without wheezing, rhonchi or crackles noted.   Heart: S1+S2+0, regular, no murmurs, rubs or gallops noted.    Abdomen: Soft, non-tender and non-distended.   Extremities: There is no obvious edema in the distal lower extremities bilaterally.    Skin: Warm and dry without trophic changes noted.   Musculoskeletal: exam reveals no obvious joint deformities.    Neurologically:  Mental status: The patient is awake, alert and oriented in all 4 spheres. His immediate and remote memory, attention, language skills and fund of knowledge are appropriate. There is no evidence of aphasia, agnosia, apraxia or anomia. Speech is clear with normal prosody and enunciation. Thought process is linear. Mood is normal and affect is normal.  Cranial nerves II - XII are as described above under HEENT exam.  Motor exam: Normal bulk, strength and tone is noted. There is no resting or action tremor, fine motor skills and coordination: grossly intact.  Cerebellar testing: No dysmetria or intention tremor. There is no truncal or gait ataxia.  Sensory exam: intact to light touch in the upper and lower extremities.  Gait, station and balance: He stands  easily. No veering to one side is noted. No leaning to one side is noted. Posture is age-appropriate and stance is narrow based. Gait shows normal stride length and normal pace. Stable exam. Assessment and Plan:  In summary, Fread Kottke is a 62 year old male with an underlying medical history of atrial fibrillation, hyperlipidemia, recent gout attack and obesity, who presents for follow-up consultation of his obstructive sleep apnea.  He had a split-night sleep study on 04/21/2017.  He has been on home CPAP therapy at a pressure of 9 cm.  He is up-to-date with his supplies and fully compliant with treatment.  He has continued to benefit from treatment.  He should be eligible for new machine later this month but is not necessarily keen on getting a new machine right away, he has had no trouble with his machine and we mutually agreed to forego any additional testing or prescription for new machine at this time.  He was under the impression that he was supposed to come back in April for a new machine but I explained to him that it is not mandatory.  He is fully satisfied with his current machine, it has given him no trouble.  He is good with his supply change and cleanliness with the machine.  He is advised to follow-up in  about a year, we can certainly consider a home sleep test at the time for reassessment of his sleep apnea and consideration for a new machine at that time.  I answered all his questions today and he was in agreement with our plan.  We will cancel his appointment for later this fall. I spent 30 minutes in total face-to-face time and in reviewing records during pre-charting, more than 50% of which was spent in counseling and coordination of care, reviewing test results, reviewing medications and treatment regimen and/or in discussing or reviewing the diagnosis of OSA, the prognosis and treatment options. Pertinent laboratory and imaging test results that were available during this  visit with the patient were reviewed by me and considered in my medical decision making (see chart for details).

## 2022-07-13 ENCOUNTER — Other Ambulatory Visit: Payer: Self-pay | Admitting: Internal Medicine

## 2022-08-03 ENCOUNTER — Other Ambulatory Visit: Payer: Self-pay

## 2022-08-03 ENCOUNTER — Emergency Department (HOSPITAL_COMMUNITY)
Admission: EM | Admit: 2022-08-03 | Discharge: 2022-08-03 | Disposition: A | Discharge status: Home / Self Care | Payer: BC Managed Care – PPO | Attending: Emergency Medicine | Admitting: Emergency Medicine

## 2022-08-03 ENCOUNTER — Ambulatory Visit (HOSPITAL_COMMUNITY)
Admission: EM | Admit: 2022-08-03 | Discharge: 2022-08-03 | Disposition: A | Discharge status: Home / Self Care | Payer: BC Managed Care – PPO

## 2022-08-03 ENCOUNTER — Emergency Department (HOSPITAL_COMMUNITY): Payer: BC Managed Care – PPO

## 2022-08-03 ENCOUNTER — Encounter (HOSPITAL_COMMUNITY): Payer: Self-pay

## 2022-08-03 DIAGNOSIS — R1904 Left lower quadrant abdominal swelling, mass and lump: Secondary | ICD-10-CM

## 2022-08-03 DIAGNOSIS — R1031 Right lower quadrant pain: Secondary | ICD-10-CM | POA: Insufficient documentation

## 2022-08-03 DIAGNOSIS — R109 Unspecified abdominal pain: Secondary | ICD-10-CM

## 2022-08-03 DIAGNOSIS — R1013 Epigastric pain: Secondary | ICD-10-CM | POA: Diagnosis not present

## 2022-08-03 LAB — CBC WITH DIFFERENTIAL/PLATELET
Abs Immature Granulocytes: 0.02 10*3/uL (ref 0.00–0.07)
Basophils Absolute: 0 10*3/uL (ref 0.0–0.1)
Basophils Relative: 1 %
Eosinophils Absolute: 0 10*3/uL (ref 0.0–0.5)
Eosinophils Relative: 1 %
HCT: 43.9 % (ref 39.0–52.0)
Hemoglobin: 14.4 g/dL (ref 13.0–17.0)
Immature Granulocytes: 0 %
Lymphocytes Relative: 32 %
Lymphs Abs: 2.1 10*3/uL (ref 0.7–4.0)
MCH: 33.9 pg (ref 26.0–34.0)
MCHC: 32.8 g/dL (ref 30.0–36.0)
MCV: 103.3 fL — ABNORMAL HIGH (ref 80.0–100.0)
Monocytes Absolute: 0.6 10*3/uL (ref 0.1–1.0)
Monocytes Relative: 10 %
Neutro Abs: 3.7 10*3/uL (ref 1.7–7.7)
Neutrophils Relative %: 56 %
Platelets: 236 10*3/uL (ref 150–400)
RBC: 4.25 MIL/uL (ref 4.22–5.81)
RDW: 13.2 % (ref 11.5–15.5)
WBC: 6.5 10*3/uL (ref 4.0–10.5)
nRBC: 0 % (ref 0.0–0.2)

## 2022-08-03 LAB — URINALYSIS, ROUTINE W REFLEX MICROSCOPIC
Bilirubin Urine: NEGATIVE
Glucose, UA: NEGATIVE mg/dL
Hgb urine dipstick: NEGATIVE
Ketones, ur: NEGATIVE mg/dL
Leukocytes,Ua: NEGATIVE
Nitrite: NEGATIVE
Protein, ur: NEGATIVE mg/dL
Specific Gravity, Urine: 1.006 (ref 1.005–1.030)
pH: 6 (ref 5.0–8.0)

## 2022-08-03 LAB — COMPREHENSIVE METABOLIC PANEL
ALT: 63 U/L — ABNORMAL HIGH (ref 0–44)
AST: 48 U/L — ABNORMAL HIGH (ref 15–41)
Albumin: 3.9 g/dL (ref 3.5–5.0)
Alkaline Phosphatase: 42 U/L (ref 38–126)
Anion gap: 9 (ref 5–15)
BUN: 16 mg/dL (ref 8–23)
CO2: 25 mmol/L (ref 22–32)
Calcium: 9.5 mg/dL (ref 8.9–10.3)
Chloride: 106 mmol/L (ref 98–111)
Creatinine, Ser: 1.35 mg/dL — ABNORMAL HIGH (ref 0.61–1.24)
GFR, Estimated: 59 mL/min — ABNORMAL LOW (ref >60)
Glucose, Bld: 99 mg/dL (ref 70–99)
Potassium: 4 mmol/L (ref 3.5–5.1)
Sodium: 140 mmol/L (ref 135–145)
Total Bilirubin: 0.8 mg/dL (ref 0.3–1.2)
Total Protein: 7.6 g/dL (ref 6.5–8.1)

## 2022-08-03 LAB — LIPASE, BLOOD: Lipase: 45 U/L (ref 11–51)

## 2022-08-03 MED ORDER — MORPHINE SULFATE (PF) 4 MG/ML IV SOLN
4.0000 mg | Freq: Once | INTRAVENOUS | Status: AC
  Administered 2022-08-03: 4 mg via INTRAVENOUS
  Filled 2022-08-03: qty 1

## 2022-08-03 MED ORDER — DICYCLOMINE HCL 20 MG PO TABS: 20.0000 mg | ORAL_TABLET | Freq: Two times a day (BID) | ORAL | 0 refills | Status: DC

## 2022-08-03 MED ORDER — IOHEXOL 350 MG/ML SOLN
75.0000 mL | Freq: Once | INTRAVENOUS | Status: AC | PRN
  Administered 2022-08-03: 75 mL via INTRAVENOUS

## 2022-08-03 MED ORDER — FAMOTIDINE 20 MG PO TABS: 20.0000 mg | ORAL_TABLET | Freq: Two times a day (BID) | ORAL | 0 refills | Status: DC

## 2022-08-03 NOTE — ED Notes (Signed)
Patient transported to CT 

## 2022-08-03 NOTE — Discharge Instructions (Addendum)
Your workup today has been quite reassuring.  I have written you a prescription for Bentyl and Pepcid.  Make sure you are drinking plenty of water.  Follow-up with your primary care doctor.  You may always return emergency room for any new or concerning symptoms.  Su trabajo de hoy ha sido bastante tranquilizador.  Le he escrito una receta para Bentyl y Pepcid.  Asegrate de beber [place] [employer].  Haga un seguimiento con su mdico de atencin primaria.  Siempre puede regresar a la sala de emergencias por cualquier sntoma nuevo o preocupante.

## 2022-08-03 NOTE — Discharge Instructions (Addendum)
Vaya a emergencias imediatamente porque necesitas que te hagan un Cat Scan

## 2022-08-03 NOTE — ED Triage Notes (Signed)
Pt c/o RLQ of abdomen that radiates to lower backx4d. Pt denies N/V/D.

## 2022-08-03 NOTE — ED Provider Notes (Signed)
[place] EMERGENCY DEPARTMENT AT [facility] Provider Note   CSN: [id number] Arrival date & time: 08/03/22  4098     History {Add pertinent medical, surgical, social history, OB history to HPI:1} Chief Complaint  Patient presents with  . Abdominal Pain    Evan Page is a 62 y.o. male.   Abdominal Pain      Home Medications Prior to Admission medications   Medication Sig Start Date End Date Taking? Authorizing Provider  furosemide (LASIX) 20 MG tablet TAKE 1 TABLET BY MOUTH EVERY DAY AS NEEDED 07/14/22   Georgina Quint, MD  sildenafil (VIAGRA) 100 MG tablet Take 1 tablet (100 mg total) by mouth as needed for erectile dysfunction. 08/23/19   Georgina Quint, MD      Allergies    Patient has no known allergies.    Review of Systems   Review of Systems  Gastrointestinal:  Positive for abdominal pain.    Physical Exam Updated Vital Signs BP (!) 148/76 (BP Location: Right Arm)   Pulse 66   Temp 97.6 F (36.4 C) (Oral)   Resp 20   Ht 5\' 7"  (1.702 m)   Wt 107.9 kg   SpO2 100%   BMI 37.26 kg/m  Physical Exam  ED Results / Procedures / Treatments   Labs (all labs ordered are listed, but only abnormal results are displayed) Labs Reviewed  CBC WITH DIFFERENTIAL/PLATELET - Abnormal; Notable for the following components:      Result Value   MCV 103.3 (*)    All other components within normal limits  COMPREHENSIVE METABOLIC PANEL - Abnormal; Notable for the following components:   Creatinine, Ser 1.35 (*)    AST 48 (*)    ALT 63 (*)    GFR, Estimated 59 (*)    All other components within normal limits  URINALYSIS, ROUTINE W REFLEX MICROSCOPIC - Abnormal; Notable for the following components:   Color, Urine STRAW (*)    All other components within normal limits  LIPASE, BLOOD    EKG None  Radiology CT ABDOMEN PELVIS W CONTRAST  Result Date: 08/03/2022 CLINICAL DATA:  Right lower quadrant abdominal pain. EXAM: CT  ABDOMEN AND PELVIS WITH CONTRAST TECHNIQUE: Multidetector CT imaging of the abdomen and pelvis was performed using the standard protocol following bolus administration of intravenous contrast. RADIATION DOSE REDUCTION: This exam was performed according to the departmental dose-optimization program which includes automated exposure control, adjustment of the mA and/or kV according to patient size and/or use of iterative reconstruction technique. CONTRAST:  75mL OMNIPAQUE IOHEXOL 350 MG/ML SOLN COMPARISON:  08/23/2019 FINDINGS: Lower chest: Unremarkable. Hepatobiliary: No suspicious focal abnormality within the liver parenchyma. There is no evidence for gallstones, gallbladder wall thickening, or pericholecystic fluid. No intrahepatic or extrahepatic biliary dilation. Pancreas: No focal mass lesion. No dilatation of the main duct. No intraparenchymal cyst. No peripancreatic edema. Spleen: No splenomegaly. No suspicious focal mass lesion. Adrenals/Urinary Tract: No adrenal nodule or mass. Kidneys unremarkable. No evidence for hydroureter. The urinary bladder appears normal for the degree of distention. Stomach/Bowel: Stomach is unremarkable. No gastric wall thickening. No evidence of outlet obstruction. Duodenum is normally positioned as is the ligament of Treitz. Duodenal diverticulum noted. No small bowel wall thickening. No small bowel dilatation. The terminal ileum is normal. Diverticuli are noted in the distal and terminal ileum. The appendix is normal. No gross colonic mass. No colonic wall thickening. Diverticular changes are noted in the left colon without evidence of diverticulitis. Vascular/Lymphatic:  There is mild atherosclerotic calcification of the abdominal aorta without aneurysm. There is no gastrohepatic or hepatoduodenal ligament lymphadenopathy. No retroperitoneal or mesenteric lymphadenopathy. No pelvic sidewall lymphadenopathy. Reproductive: The prostate gland and seminal vesicles are unremarkable.  Other: No intraperitoneal free fluid. Musculoskeletal: No worrisome lytic or sclerotic osseous abnormality. IMPRESSION: 1. No acute findings in the abdomen or pelvis. Specifically, no findings to explain the patient's history of right lower quadrant pain. 2. Diverticulosis of the duodenum as well as the distal and terminal ileum without diverticulitis. 3. Colonic diverticulosis without diverticulitis. 4.  Aortic Atherosclerosis (ICD10-I70.0). Electronically Signed   By: Kennith Center M.D.   On: 08/03/2022 12:57    Procedures Procedures  {Document cardiac monitor, telemetry assessment procedure when appropriate:1}  Medications Ordered in ED Medications  morphine (PF) 4 MG/ML injection 4 mg (4 mg Intravenous Given 08/03/22 1135)  iohexol (OMNIPAQUE) 350 MG/ML injection 75 mL (75 mLs Intravenous Contrast Given 08/03/22 1239)    ED Course/ Medical Decision Making/ A&P   {   Click here for ABCD2, HEART and other calculatorsREFRESH Note before signing :1}                          Medical Decision Making Risk Prescription drug management.   ***  {Document critical care time when appropriate:1} {Document review of labs and clinical decision tools ie heart score, Chads2Vasc2 etc:1}  {Document your independent review of radiology images, and any outside records:1} {Document your discussion with family members, caretakers, and with consultants:1} {Document social determinants of health affecting pt's care:1} {Document your decision making why or why not admission, treatments were needed:1} Final Clinical Impression(s) / ED Diagnoses Final diagnoses:  None    Rx / DC Orders ED Discharge Orders     None

## 2022-08-03 NOTE — ED Triage Notes (Signed)
Pt presents with c/o RLQ pain that radiates to the lower back X 4 days.   Pt states he has taken teas at home for pain relief, no OTC medicine.   Denies n/v/d and urinary issues. States he feels the back of his neck is hot.

## 2022-08-03 NOTE — ED Provider Notes (Signed)
[facility]    CSN: [id number] Arrival date & time: 08/03/22  0820      History   Chief Complaint Chief Complaint  Patient presents with   Abdominal Pain   Back Pain    HPI Evan Page is a 62 y.o. male who presents with RLQ pain tha radiates to lower back x 4 days. Has been a gradual pain and got worse last night and could not sleep due to the pain. His appetite has been normal. Has been having sweating episodes, but has not checked his temp. Denies N/V/D/C. Has hx of diverticulum's per last years colonoscopy on R and L colon. Denies urinary symptoms.      Past Medical History:  Diagnosis Date   Atrial fibrillation (HCC)    a. 04/2017 s/p PVI. 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   Obesity (BMI 30.0-34.9) 03/27/2018   Obstructive sleep apnea on CPAP 11/01/2017   Erectile dysfunction 11/01/2017   Paroxysmal atrial fibrillation (HCC) 03/03/2017    Past Surgical History:  Procedure Laterality Date   ATRIAL FIBRILLATION ABLATION N/A 05/06/2017   Procedure: ATRIAL FIBRILLATION ABLATION;  Surgeon: Regan Lemming, MD;  [facility];  Service: Cardiovascular;  Laterality: N/A;   CARDIOVERSION N/A 03/04/2017   Procedure: CARDIOVERSION;  Surgeon: Yates Decamp, MD;  Location: [facility] ENDOSCOPY;  Service: Cardiovascular;  Laterality: N/A;   LEFT HEART CATH AND CORONARY ANGIOGRAPHY N/A 05/24/2017   Procedure: LEFT HEART CATH AND CORONARY ANGIOGRAPHY possible angioplasty;  Surgeon: Elder Negus, MD;  Location: [facility];  Service: Cardiovascular;  Laterality: N/A;   TOOTH EXTRACTION   05/2021   [place]   WISDOM TOOTH EXTRACTION     all 4       Home Medications    Prior to Admission medications   Medication Sig Start Date End Date Taking? Authorizing Provider  furosemide (LASIX) 20 MG tablet TAKE 1 TABLET BY MOUTH EVERY DAY AS NEEDED 07/14/22   Georgina Quint, MD  sildenafil (VIAGRA) 100 MG tablet Take 1 tablet (100 mg total) by mouth as needed for erectile dysfunction. 08/23/19   Georgina Quint, MD    Family History Family History  Problem Relation Age of Onset   Diabetes Mother    Hyperlipidemia Mother    Diabetes Father    Hyperlipidemia Father    Esophageal cancer Father    Drug abuse Sister    Pancreatic cancer Sister    Hyperlipidemia Maternal Grandmother    Colon polyps Niece    Colon cancer Neg Hx    Prostate cancer Neg Hx    Rectal cancer Neg Hx    Stomach cancer Neg Hx     Social History Social History   Tobacco Use   Smoking status: Former    Packs/day: 1.00    Years: 20.00    Additional pack years: 0.00    Total pack years: 20.00    Types: Cigarettes    Quit date: 1998    Years since quitting: 26.5   Smokeless tobacco:  Never  Vaping Use   Vaping Use: Never used  Substance Use Topics   Alcohol use: Yes    Alcohol/week: 3.0 standard drinks of alcohol    Types: 3 Glasses of wine per week    Comment: occasionally   Drug use: No     Allergies   Patient has no known allergies.   Review of Systems Review of Systems  As noted in HPI Physical Exam Triage Vital Signs ED Triage Vitals  Enc Vitals Group     BP 08/03/22 0840 133/64     Pulse Rate 08/03/22 0839 65     Resp 08/03/22 0839 17     Temp 08/03/22 0839 98 F (36.7 C)     Temp Source 08/03/22 0839 Oral     SpO2 08/03/22 0839 95 %     Weight --      Height --      Head Circumference --      Peak Flow --      Pain Score 08/03/22 0838 7     Pain Loc --      Pain Edu? --      Excl. in GC? --    No data found.  Updated Vital Signs BP 133/64    Pulse 65   Temp 98 F (36.7 C) (Oral)   Resp 17   SpO2 95%   Visual Acuity Right Eye Distance:   Left Eye Distance:   Bilateral Distance:    Right Eye Near:   Left Eye Near:    Bilateral Near:     Physical Exam Vitals and nursing note reviewed.  Constitutional:      Comments: Moves slow due to pain  Pulmonary:     Effort: Pulmonary effort is normal.  Abdominal:     General: Abdomen is protuberant. Bowel sounds are decreased.     Palpations: Abdomen is soft.     Tenderness: There is abdominal tenderness in the right upper quadrant. There is guarding. There is no rebound. Positive signs include psoas sign.     Hernia: No hernia is present.     Comments: Has a mass on L mid abdomen which is tender  Skin:    General: Skin is warm and dry.     Findings: No rash.  Neurological:     Mental Status: Evan Page is alert and oriented to person, place, and time.  Psychiatric:        Mood and Affect: Mood normal.        Behavior: Behavior normal.      UC Treatments / Results  Labs (all labs ordered are listed, but only abnormal results are displayed) Labs Reviewed - No data to display  EKG   Radiology No results found.  Procedures Procedures (including critical care time)  Medications Ordered in UC Medications - No data to display  Initial Impression / Assessment and Plan / UC Course  I have reviewed the triage vital signs and the nursing notes.  Acute abdominal pain   Sent to ER for further work up.  Final Clinical Impressions(s) / UC Diagnoses   Final diagnoses:  Acute abdominal pain  Abdominal mass, left lower quadrant     Discharge Instructions      Vaya a emergencias imediatamente porque necesitas que te hagan un Cat Scan     ED Prescriptions   None    PDMP not reviewed this encounter.   Garey Ham, [place] 08/03/22 [phone]

## 2022-08-04 ENCOUNTER — Telehealth: Payer: Self-pay

## 2022-08-04 NOTE — Transitions of Care (Post Inpatient/ED Visit) (Signed)
   08/04/2022  Name: Marquiese Cassani [facility] MRN: [id number] DOB: 03-26-1960  Today's TOC FU Call Status: Today's TOC FU Call Status:: Successful TOC FU Call Competed TOC FU Call Complete Date: 08/04/22  Transition Care Management Follow-up Telephone Call Date of Discharge: 08/03/22 Discharge Facility: [facility]) Type of Discharge: Emergency Department Reason for ED Visit: Other: (abdominal pain) How have you been since you were released from the hospital?: Better Any questions or concerns?: No  Items Reviewed: Did you receive and understand the discharge instructions provided?: Yes Medications obtained,verified, and reconciled?: Yes (Medications Reviewed) Any new allergies since your discharge?: No Dietary orders reviewed?: Yes Type of Diet Ordered:: resume regular diet Do you have support at home?: Yes  Medications Reviewed Today: Medications Reviewed Today     Reviewed by Trip Cavanagh, Jordan Hawks, CMA (Certified Medical Assistant) on 08/04/22 at 1143  Med List Status: <None>   Medication Order Taking? Sig Documenting Provider Last Dose Status Informant  dicyclomine (BENTYL) 20 MG tablet [id number]  Take 1 tablet (20 mg total) by mouth 2 (two) times daily. Gailen Shelter, [place]  Active   famotidine (PEPCID) 20 MG tablet [id number]  Take 1 tablet (20 mg total) by mouth 2 (two) times daily. Solon Augusta [place], [place]  Active   furosemide (LASIX) 20 MG tablet [id number]  TAKE 1 TABLET BY MOUTH EVERY DAY AS NEEDED Georgina Quint, MD  Active   sildenafil (VIAGRA) 100 MG tablet [id number] No Take 1 tablet (100 mg total) by mouth as needed for erectile dysfunction. Georgina Quint, MD Taking Active             Home Care and Equipment/Supplies: Were Home Health Services Ordered?: NA Any new equipment or medical supplies ordered?: NA  Functional Questionnaire: Do you need assistance with bathing/showering or dressing?: No Do you need assistance with meal preparation?: No Do  you need assistance with eating?: No Do you have difficulty maintaining continence: No Do you need assistance with getting out of bed/getting out of a chair/moving?: No Do you have difficulty managing or taking your medications?: No  Follow up appointments reviewed: PCP Follow-up appointment confirmed?: Yes Date of PCP follow-up appointment?: 09/09/22 Follow-up Provider: Dr. Alvy Bimler (patient did not schedule sooner f/u do to upcoming vacation) Specialist Hospital Follow-up appointment confirmed?: NA Do you need transportation to your follow-up appointment?: No Do you understand care options if your condition(s) worsen?: Yes-patient verbalized understanding  Patient did not schedule a separate follow up sooner because he is going out of the country for ~30 days.   SIGNATURE Abby Anyia Gierke, CMA  [facility] AW[facility] Team Direct Dial: [phone]

## 2022-09-09 ENCOUNTER — Encounter: Payer: Self-pay | Admitting: Emergency Medicine

## 2022-09-09 ENCOUNTER — Ambulatory Visit (INDEPENDENT_AMBULATORY_CARE_PROVIDER_SITE_OTHER): Payer: BC Managed Care – PPO | Admitting: Emergency Medicine

## 2022-09-09 VITALS — BP 128/68 | HR 63 | Temp 98.6°F | Ht 67.0 in | Wt 235.6 lb

## 2022-09-09 DIAGNOSIS — I10 Essential (primary) hypertension: Secondary | ICD-10-CM | POA: Diagnosis not present

## 2022-09-09 DIAGNOSIS — I480 Paroxysmal atrial fibrillation: Secondary | ICD-10-CM

## 2022-09-09 DIAGNOSIS — Z13228 Encounter for screening for other metabolic disorders: Secondary | ICD-10-CM

## 2022-09-09 DIAGNOSIS — Z13220 Encounter for screening for lipoid disorders: Secondary | ICD-10-CM

## 2022-09-09 DIAGNOSIS — Z125 Encounter for screening for malignant neoplasm of prostate: Secondary | ICD-10-CM | POA: Diagnosis not present

## 2022-09-09 DIAGNOSIS — Z1329 Encounter for screening for other suspected endocrine disorder: Secondary | ICD-10-CM | POA: Diagnosis not present

## 2022-09-09 DIAGNOSIS — G4733 Obstructive sleep apnea (adult) (pediatric): Secondary | ICD-10-CM | POA: Diagnosis not present

## 2022-09-09 DIAGNOSIS — Z6836 Body mass index (BMI) 36.0-36.9, adult: Secondary | ICD-10-CM

## 2022-09-09 DIAGNOSIS — Z0000 Encounter for general adult medical examination without abnormal findings: Secondary | ICD-10-CM

## 2022-09-09 DIAGNOSIS — L819 Disorder of pigmentation, unspecified: Secondary | ICD-10-CM | POA: Insufficient documentation

## 2022-09-09 DIAGNOSIS — Z130 Encounter for screening for diseases of the blood and blood-forming organs and certain disorders involving the immune mechanism: Secondary | ICD-10-CM

## 2022-09-09 DIAGNOSIS — M1A9XX1 Chronic gout, unspecified, with tophus (tophi): Secondary | ICD-10-CM

## 2022-09-09 DIAGNOSIS — Z0001 Encounter for general adult medical examination with abnormal findings: Secondary | ICD-10-CM

## 2022-09-09 LAB — COMPREHENSIVE METABOLIC PANEL
ALT: 37 U/L (ref 0–53)
AST: 27 U/L (ref 0–37)
Albumin: 4.3 g/dL (ref 3.5–5.2)
Alkaline Phosphatase: 48 U/L (ref 39–117)
BUN: 20 mg/dL (ref 6–23)
CO2: 29 mEq/L (ref 19–32)
Calcium: 9.3 mg/dL (ref 8.4–10.5)
Chloride: 107 mEq/L (ref 96–112)
Creatinine, Ser: 1.35 mg/dL (ref 0.40–1.50)
GFR: 56.03 mL/min — ABNORMAL LOW (ref >60.00)
Glucose, Bld: 97 mg/dL (ref 70–99)
Potassium: 4.5 mEq/L (ref 3.5–5.1)
Sodium: 142 mEq/L (ref 135–145)
Total Bilirubin: 0.4 mg/dL (ref 0.2–1.2)
Total Protein: 7.4 g/dL (ref 6.0–8.3)

## 2022-09-09 LAB — LIPID PANEL
Cholesterol: 143 mg/dL (ref 0–200)
HDL: 38.5 mg/dL — ABNORMAL LOW (ref >39.00)
LDL Cholesterol: 76 mg/dL (ref 0–99)
NonHDL: 104.48
Total CHOL/HDL Ratio: 4
Triglycerides: 144 mg/dL (ref 0.0–149.0)
VLDL: 28.8 mg/dL (ref 0.0–40.0)

## 2022-09-09 LAB — CBC WITH DIFFERENTIAL/PLATELET
Basophils Absolute: 0 10*3/uL (ref 0.0–0.1)
Basophils Relative: 0.5 % (ref 0.0–3.0)
Eosinophils Absolute: 0 10*3/uL (ref 0.0–0.7)
Eosinophils Relative: 0.5 % (ref 0.0–5.0)
HCT: 43.4 % (ref 39.0–52.0)
Hemoglobin: 14 g/dL (ref 13.0–17.0)
Lymphocytes Relative: 45.5 % (ref 12.0–46.0)
Lymphs Abs: 3 10*3/uL (ref 0.7–4.0)
MCHC: 32.3 g/dL (ref 30.0–36.0)
MCV: 103.1 fl — ABNORMAL HIGH (ref 78.0–100.0)
Monocytes Absolute: 0.6 10*3/uL (ref 0.1–1.0)
Monocytes Relative: 9.7 % (ref 3.0–12.0)
Neutro Abs: 2.8 10*3/uL (ref 1.4–7.7)
Neutrophils Relative %: 43.8 % (ref 43.0–77.0)
Platelets: 246 10*3/uL (ref 150.0–400.0)
RBC: 4.2 Mil/uL — ABNORMAL LOW (ref 4.22–5.81)
RDW: 13.9 % (ref 11.5–15.5)
WBC: 6.5 10*3/uL (ref 4.0–10.5)

## 2022-09-09 LAB — HEMOGLOBIN A1C: Hgb A1c MFr Bld: 6 % (ref 4.6–6.5)

## 2022-09-09 LAB — PSA: PSA: 0.36 ng/mL (ref 0.10–4.00)

## 2022-09-09 LAB — URIC ACID: Uric Acid, Serum: 9.3 mg/dL — ABNORMAL HIGH (ref 4.0–7.8)

## 2022-09-09 NOTE — Patient Instructions (Signed)
Mantenimiento de Research officer, political party, Male Adoptar un estilo de vida saludable y recibir atencin preventiva son importantes para promover la salud y Counsellor. Consulte al mdico sobre: El esquema adecuado para hacerse pruebas y exmenes peridicos. Cosas que puede hacer por su cuenta para prevenir enfermedades y [place] sano. Qu debo saber sobre la dieta, el peso y el ejercicio? Consuma una dieta saludable  Consuma una dieta que incluya muchas verduras, frutas, productos lcteos con bajo contenido de [place] y Associate Professor. No consuma muchos alimentos ricos en grasas slidas, azcares agregados o sodio. Mantenga un peso saludable El ndice de masa muscular [facility]) es una medida que puede utilizarse para identificar posibles problemas de [place]. Proporciona una estimacin de la grasa corporal basndose en el peso y la altura. Su mdico puede ayudarle a Engineer, site [facility] y a Personnel officer o Pharmacologist un peso saludable. Haga ejercicio con regularidad Haga ejercicio con regularidad. Esta es una de las prcticas ms importantes que puede hacer por su salud. La [employer] de los adultos deben seguir estas pautas: Education officer, environmental, al menos, 150 minutos de actividad fsica por semana. El ejercicio debe aumentar la frecuencia cardaca y Media planner transpirar (ejercicio de intensidad moderada). Hacer ejercicios de fortalecimiento por lo [employer] por semana. Agregue esto a su plan de ejercicio de intensidad moderada. Pase menos tiempo sentado. Incluso la actividad fsica ligera puede ser beneficiosa. Controle sus niveles de colesterol y lpidos en la sangre Comience a realizarse anlisis de lpidos y Oncologist en la sangre a los 20 aos y luego reptalos cada 5 aos. Es posible que Insurance underwriter los niveles de colesterol con mayor frecuencia si: Sus niveles de lpidos y colesterol son altos. Es mayor de 40 aos. Presenta un alto riesgo de padecer enfermedades cardacas. Qu debo  saber sobre las pruebas de deteccin del cncer? Muchos tipos de cncer pueden detectarse de manera temprana y, a menudo, pueden prevenirse. Segn su historia clnica y sus antecedentes familiares, es posible que deba realizarse pruebas de deteccin del cncer en diferentes edades. Esto puede incluir pruebas de deteccin de lo siguiente: Building services engineer. Cncer de prstata. Cncer de piel. Cncer de pulmn. Qu debo saber sobre la enfermedad cardaca, la diabetes y la hipertensin arterial? Presin arterial y enfermedad cardaca La hipertensin arterial causa enfermedades cardacas y [place] el riesgo de accidente cerebrovascular. Es ms probable que esto se manifieste en las personas que tienen lecturas de presin arterial alta o tienen sobrepeso. Hable con el mdico sobre sus valores de presin arterial deseados. Hgase controlar la presin arterial: Cada 3 a 5 aos si tiene entre 18 y 30 aos. Todos los aos si es mayor de 40 aos. Si tiene entre 65 y 65 aos y es fumador o Insurance underwriter, pregntele al mdico si debe realizarse una prueba de deteccin de aneurisma artico abdominal (AAA) por nica vez. Diabetes Realcese exmenes de deteccin de la diabetes con regularidad. Este anlisis revisa el nivel de azcar en la sangre en [place]. Hgase las pruebas de deteccin: Cada tres aos despus de los 45 aos de edad si tiene un peso normal y un bajo riesgo de padecer diabetes. Con ms frecuencia y a partir de [place] edad inferior si tiene sobrepeso o un alto riesgo de padecer diabetes. Qu debo saber sobre la prevencin de infecciones? Hepatitis B Si tiene un riesgo ms alto de contraer hepatitis B, debe someterse a un examen de deteccin de este virus. Hable con el mdico para averiguar si tiene riesgo de  contraer la infeccin por hepatitis B. Hepatitis C Se recomienda un anlisis de [place] para: Todos los que nacieron entre 1945 y [phone]. Todas las personas que tengan un riesgo de haber  contrado hepatitis C. Enfermedades de transmisin sexual (ETS) Debe realizarse pruebas de deteccin de ITS todos los aos, incluidas la gonorrea y la clamidia, si: Es sexualmente activo y es menor de [address]. Es mayor de [address], y Public affairs consultant informa que corre riesgo de tener este tipo de infecciones. La actividad sexual ha cambiado desde que le hicieron la ltima prueba de deteccin y tiene un riesgo mayor de Warehouse manager clamidia o Copy. Pregntele al mdico si usted tiene riesgo. Pregntele al mdico si usted tiene un alto riesgo de Primary school teacher VIH. El mdico tambin puede recomendarle un medicamento recetado para ayudar a evitar la infeccin por el VIH. Si elige tomar medicamentos para prevenir el VIH, primero debe [employer] de deteccin del VIH. Luego debe hacerse anlisis cada 3 meses mientras est tomando los medicamentos. Siga estas indicaciones en su casa: Consumo de alcohol No beba alcohol si el mdico se lo prohbe. Si bebe alcohol: Limite la cantidad que consume de 0 a 2 bebidas por da. Sepa cunta cantidad de alcohol hay en las bebidas que toma. En los [address], una medida equivale a una botella de cerveza de 12 oz (355 ml), un vaso de vino de 5 oz (148 ml) o un vaso de una bebida alcohlica de alta graduacin de 1 oz (44 ml). Estilo de vida No consuma ningn producto que contenga nicotina o tabaco. Estos productos incluyen cigarrillos, tabaco para Theatre manager y aparatos de vapeo, como los Administrator, Civil Service. Si necesita ayuda para dejar de consumir estos productos, consulte al mdico. No consuma drogas. No comparta agujas. Solicite ayuda a su mdico si necesita apoyo o informacin para abandonar las drogas. Indicaciones generales Realcese los estudios de rutina de [address], dentales y de Wellsite geologist. Mantngase al da con las vacunas. Infrmele a su mdico si: Se siente deprimido con frecuencia. Alguna vez ha sido vctima de [place] o no se siente seguro en su  casa. Resumen Adoptar un estilo de vida saludable y recibir atencin preventiva son importantes para promover la salud y Counsellor. Siga las instrucciones del mdico acerca de una dieta saludable, el ejercicio y la realizacin de pruebas o exmenes para Hotel manager. Siga las instrucciones del mdico con respecto al control del colesterol y la presin arterial. Esta informacin no tiene Theme park manager el consejo del mdico. Asegrese de hacerle al mdico cualquier pregunta que tenga. Document Revised: 06/25/2020 Document Reviewed: 06/25/2020 Elsevier Patient Education  2024 [employer].

## 2022-09-09 NOTE — Progress Notes (Signed)
[name] 62 y.o.   Chief Complaint  Patient presents with   Annual Exam    HISTORY OF PRESENT ILLNESS: This is a 62 y.o. male here for annual exam. Overall doing well. No complaints or medical concerns today  HPI   Prior to Admission medications   Medication Sig Start Date End Date Taking? Authorizing Provider  losartan (COZAAR) 25 MG tablet Take 25 mg by mouth daily. Pt states he's taking 20mg    Yes [provider]  sildenafil (VIAGRA) 100 MG tablet Take 1 tablet (100 mg total) by mouth as needed for erectile dysfunction. 08/23/19  Yes Havish Petties, Eilleen Kempf, MD  dicyclomine (BENTYL) 20 MG tablet Take 1 tablet (20 mg total) by mouth 2 (two) times daily. Patient not taking: Reported on 09/09/2022 08/03/22   Gailen Shelter, PA  famotidine (PEPCID) 20 MG tablet Take 1 tablet (20 mg total) by mouth 2 (two) times daily. Patient not taking: Reported on 09/09/2022 08/03/22   Gailen Shelter, PA  furosemide (LASIX) 20 MG tablet TAKE 1 TABLET BY MOUTH EVERY DAY AS NEEDED Patient not taking: Reported on 09/09/2022 07/14/22   Georgina Quint, MD    No Known Allergies  Patient Active Problem List   Diagnosis Date Noted   HTN (hypertension) 11/23/2021   Acute gouty arthritis 03/24/2021   Chronic gout with tophus 03/24/2021   Obesity (BMI 30.0-34.9) 03/27/2018   Obstructive sleep apnea on CPAP 11/01/2017   Erectile dysfunction 11/01/2017   Paroxysmal atrial fibrillation (HCC) 03/03/2017    Past Medical History:  Diagnosis Date   Atrial fibrillation (HCC)    a. 04/2017 s/p PVI. with ablation 05-06-2017   Atrial flutter (HCC) 2019   with cardioversion   Cataract    left small   GERD (gastroesophageal reflux disease)    past hx   Gout    Pre-diabetes    Sleep apnea    on CPAP"    Past Surgical History:  Procedure Laterality Date   ATRIAL FIBRILLATION ABLATION N/A 05/06/2017   Procedure: ATRIAL FIBRILLATION ABLATION;  Surgeon: Regan Lemming, MD;   Location: [facility] INVASIVE CV LAB;  Service: Cardiovascular;  Laterality: N/A;   CARDIOVERSION N/A 03/04/2017   Procedure: CARDIOVERSION;  Surgeon: Yates Decamp, MD;  Location: [facility] ENDOSCOPY;  Service: Cardiovascular;  Laterality: N/A;   LEFT HEART CATH AND CORONARY ANGIOGRAPHY N/A 05/24/2017   Procedure: LEFT HEART CATH AND CORONARY ANGIOGRAPHY possible angioplasty;  Surgeon: Elder Negus, MD;  Location: [facility] INVASIVE CV LAB;  Service: Cardiovascular;  Laterality: N/A;   TOOTH EXTRACTION  05/2021   [place]   WISDOM TOOTH EXTRACTION     all 4    Social History   Socioeconomic History   Marital status: Married    Spouse name: Not on file   Number of children: 2   Years of education: Not on file   Highest education level: Not on file  Occupational History   Not on file  Tobacco Use   Smoking status: Former    Current packs/day: 0.00    Average packs/day: 1 pack/day for 20.0 years (20.0 ttl pk-yrs)    Types: Cigarettes    Start date: 27    Quit date: 82    Years since quitting: 26.6   Smokeless tobacco: Never  Vaping Use   Vaping status: Never Used  Substance and Sexual Activity   Alcohol use: Yes    Alcohol/week: 3.0 standard drinks of alcohol    Types: 3 Glasses of wine  per week    Comment: occasionally   Drug use: No   Sexual activity: Yes  Other Topics Concern   Not on file  Social History Narrative   ** Merged History Encounter **       Social Determinants of Health   Financial Resource Strain: Not on file  Food Insecurity: Not on file  Transportation Needs: Not on file  Physical Activity: Not on file  Stress: Not on file  Social Connections: Not on file  Intimate Partner Violence: Not on file    Family History  Problem Relation Age of Onset   Diabetes Mother    Hyperlipidemia Mother    Diabetes Father    Hyperlipidemia Father    Esophageal cancer Father    Drug abuse Sister    Pancreatic cancer Sister    Hyperlipidemia Maternal Grandmother     Colon polyps Niece    Colon cancer Neg Hx    Prostate cancer Neg Hx    Rectal cancer Neg Hx    Stomach cancer Neg Hx      Review of Systems  Constitutional: Negative.  Negative for chills and fever.  HENT: Negative.  Negative for congestion and sore throat.   Respiratory: Negative.  Negative for cough and shortness of breath.   Cardiovascular: Negative.  Negative for chest pain and palpitations.  Gastrointestinal:  Negative for abdominal pain, diarrhea, nausea and vomiting.  Genitourinary: Negative.  Negative for dysuria and hematuria.  Skin: Negative.  Negative for rash.       Change in multiple hyperpigmented skin lesions  Neurological: Negative.  Negative for dizziness and headaches.  All other systems reviewed and are negative.  Vitals:   09/09/22 1537  BP: 128/68  Pulse: 63  Temp: 98.6 F (37 C)  SpO2: 94%    Physical Exam Vitals reviewed.  Constitutional:      Appearance: Normal appearance.  HENT:     Head: Normocephalic.     Right Ear: Tympanic membrane, ear canal and external ear normal.     Left Ear: Tympanic membrane, ear canal and external ear normal.     Mouth/Throat:     Mouth: Mucous membranes are moist.     Pharynx: Oropharynx is clear.  Eyes:     Extraocular Movements: Extraocular movements intact.     Conjunctiva/sclera: Conjunctivae normal.     Pupils: Pupils are equal, round, and reactive to light.  Cardiovascular:     Rate and Rhythm: Normal rate and regular rhythm.     Pulses: Normal pulses.     Heart sounds: Normal heart sounds.  Pulmonary:     Effort: Pulmonary effort is normal.     Breath sounds: Normal breath sounds.  Abdominal:     Palpations: Abdomen is soft.     Tenderness: There is no abdominal tenderness.  Musculoskeletal:     Cervical back: No tenderness.  Lymphadenopathy:     Cervical: No cervical adenopathy.  Skin:    General: Skin is warm and dry.     Capillary Refill: Capillary refill takes less than 2 seconds.      Findings: Lesion present.     Comments: Multiple hyperpigmented skin lesions throughout his body  Neurological:     General: No focal deficit present.     Mental Status: He is alert and oriented to person, place, and time.  Psychiatric:        Mood and Affect: Mood normal.        Behavior: Behavior normal.  ASSESSMENT & PLAN: Problem List Items Addressed This Visit       Cardiovascular and Mediastinum   Paroxysmal atrial fibrillation (HCC)    Well-controlled.  Presently in sinus rhythm. On no medication.      Relevant Medications   losartan (COZAAR) 25 MG tablet   HTN (hypertension)    BP Readings from Last 3 Encounters:  09/09/22 128/68  08/03/22 (!) 108/59  08/03/22 133/64  Well-controlled hypertension.  Continue losartan 25 mg daily       Relevant Medications   losartan (COZAAR) 25 MG tablet     Respiratory   Obstructive sleep apnea on CPAP    Stable and well-controlled on CPAP treatment        Musculoskeletal and Integument   Chronic gout with tophus    No recent gout flareups Uric acid level done today      Relevant Orders   Uric acid (Completed)   Change in multiple pigmented skin lesions    Recommend dermatology evaluation Referral placed today      Relevant Orders   Ambulatory referral to Dermatology     Other   Class 2 severe obesity due to excess calories with serious comorbidity and body mass index (BMI) of 36.0 to 36.9 in adult [facility])    Diet and nutrition discussed Cardiovascular risks associated with obesity discussed Benefits of exercise discussed Advised to decrease amount of daily carbohydrate intake and daily calories and increase amount of plant-based protein in his diet      Other Visit Diagnoses     Encounter for general adult medical examination with abnormal findings    -  Primary   Relevant Orders   CBC with Differential (Completed)   Comprehensive metabolic panel (Completed)   Hemoglobin A1c (Completed)   Lipid panel  (Completed)   PSA(Must document that pt has been informed of limitations of PSA testing.) (Completed)   Screening for prostate cancer       Relevant Orders   PSA(Must document that pt has been informed of limitations of PSA testing.) (Completed)   Screening for deficiency anemia       Relevant Orders   CBC with Differential (Completed)   Screening for lipoid disorders       Relevant Orders   Lipid panel (Completed)   Screening for endocrine, metabolic and immunity disorder       Relevant Orders   Comprehensive metabolic panel (Completed)   Hemoglobin A1c (Completed)      Modifiable risk factors discussed with patient. Anticipatory guidance according to age provided. The following topics were also discussed: Social Determinants of Health Smoking.  Non-smoker Diet and nutrition and need to decrease amount of daily carbohydrate intake and daily calories and increase amount of plant based protein in his diet Benefits of exercise Cancer screening and review of most recent colonoscopy report Vaccinations review and recommendations Cardiovascular risk assessment The 10-year ASCVD risk score (Arnett DK, et al., 2019) is: 11.6%   Values used to calculate the score:     Age: 56 years     Sex: Male     Is Non-Hispanic African American: No     Diabetic: No     Tobacco smoker: No     Systolic Blood Pressure: 128 mmHg     Is BP treated: Yes     HDL Cholesterol: 38.5 mg/dL     Total Cholesterol: 143 mg/dL Review of chronic medical conditions and review of all medications Mental health including depression and anxiety Fall and  accident prevention  Patient Instructions  Mantenimiento de la salud en los hombres Health Maintenance, Male Adoptar un estilo de vida saludable y recibir atencin preventiva son importantes para promover la salud y Counsellor. Consulte al mdico sobre: El esquema adecuado para hacerse pruebas y exmenes peridicos. Cosas que puede hacer por su cuenta para  prevenir enfermedades y [place] sano. Qu debo saber sobre la dieta, el peso y el ejercicio? Consuma una dieta saludable  Consuma una dieta que incluya muchas verduras, frutas, productos lcteos con bajo contenido de [place] y Associate Professor. No consuma muchos alimentos ricos en grasas slidas, azcares agregados o sodio. Mantenga un peso saludable El ndice de masa muscular [facility]) es una medida que puede utilizarse para identificar posibles problemas de [place]. Proporciona una estimacin de la grasa corporal basndose en el peso y la altura. Su mdico puede ayudarle a Engineer, site [facility] y a Personnel officer o Pharmacologist un peso saludable. Haga ejercicio con regularidad Haga ejercicio con regularidad. Esta es una de las prcticas ms importantes que puede hacer por su salud. La [employer] de los adultos deben seguir estas pautas: Education officer, environmental, al menos, 150 minutos de actividad fsica por semana. El ejercicio debe aumentar la frecuencia cardaca y Media planner transpirar (ejercicio de intensidad moderada). Hacer ejercicios de fortalecimiento por lo [employer] por semana. Agregue esto a su plan de ejercicio de intensidad moderada. Pase menos tiempo sentado. Incluso la actividad fsica ligera puede ser beneficiosa. Controle sus niveles de colesterol y lpidos en la sangre Comience a realizarse anlisis de lpidos y Oncologist en la sangre a los 20 aos y luego reptalos cada 5 aos. Es posible que Insurance underwriter los niveles de colesterol con mayor frecuencia si: Sus niveles de lpidos y colesterol son altos. Es mayor de 40 aos. Presenta un alto riesgo de padecer enfermedades cardacas. Qu debo saber sobre las pruebas de deteccin del cncer? Muchos tipos de cncer pueden detectarse de manera temprana y, a menudo, pueden prevenirse. Segn su historia clnica y sus antecedentes familiares, es posible que deba realizarse pruebas de deteccin del cncer en diferentes edades. Esto puede incluir pruebas de deteccin  de lo siguiente: Building services engineer. Cncer de prstata. Cncer de piel. Cncer de pulmn. Qu debo saber sobre la enfermedad cardaca, la diabetes y la hipertensin arterial? Presin arterial y enfermedad cardaca La hipertensin arterial causa enfermedades cardacas y [place] el riesgo de accidente cerebrovascular. Es ms probable que esto se manifieste en las personas que tienen lecturas de presin arterial alta o tienen sobrepeso. Hable con el mdico sobre sus valores de presin arterial deseados. Hgase controlar la presin arterial: Cada 3 a 5 aos si tiene entre 18 y 65 aos. Todos los aos si es mayor de 40 aos. Si tiene entre 65 y 56 aos y es fumador o Insurance underwriter, pregntele al mdico si debe realizarse una prueba de deteccin de aneurisma artico abdominal (AAA) por nica vez. Diabetes Realcese exmenes de deteccin de la diabetes con regularidad. Este anlisis revisa el nivel de azcar en la sangre en [place]. Hgase las pruebas de deteccin: Cada tres aos despus de los 45 aos de edad si tiene un peso normal y un bajo riesgo de padecer diabetes. Con ms frecuencia y a partir de [place] edad inferior si tiene sobrepeso o un alto riesgo de padecer diabetes. Qu debo saber sobre la prevencin de infecciones? Hepatitis B Si tiene un riesgo ms alto de contraer hepatitis B, debe someterse a un examen de deteccin de este virus. Hable con el  mdico para averiguar si tiene riesgo de contraer la infeccin por hepatitis B. Hepatitis C Se recomienda un anlisis de [place] para: Todos los que nacieron entre 1946 y [phone]. Todas las personas que tengan un riesgo de haber contrado hepatitis C. Enfermedades de transmisin sexual (ETS) Debe realizarse pruebas de deteccin de ITS todos los aos, incluidas la gonorrea y la clamidia, si: Es sexualmente activo y es menor de [address]. Es mayor de [address], y Public affairs consultant informa que corre riesgo de tener este tipo de infecciones. La actividad  sexual ha cambiado desde que le hicieron la ltima prueba de deteccin y tiene un riesgo mayor de Warehouse manager clamidia o Copy. Pregntele al mdico si usted tiene riesgo. Pregntele al mdico si usted tiene un alto riesgo de Primary school teacher VIH. El mdico tambin puede recomendarle un medicamento recetado para ayudar a evitar la infeccin por el VIH. Si elige tomar medicamentos para prevenir el VIH, primero debe [employer] de deteccin del VIH. Luego debe hacerse anlisis cada 3 meses mientras est tomando los medicamentos. Siga estas indicaciones en su casa: Consumo de alcohol No beba alcohol si el mdico se lo prohbe. Si bebe alcohol: Limite la cantidad que consume de 0 a 2 bebidas por da. Sepa cunta cantidad de alcohol hay en las bebidas que toma. En los [address], una medida equivale a una botella de cerveza de 12 oz (355 ml), un vaso de vino de 5 oz (148 ml) o un vaso de una bebida alcohlica de alta graduacin de 1 oz (44 ml). Estilo de vida No consuma ningn producto que contenga nicotina o tabaco. Estos productos incluyen cigarrillos, tabaco para Theatre manager y aparatos de vapeo, como los Administrator, Civil Service. Si necesita ayuda para dejar de consumir estos productos, consulte al mdico. No consuma drogas. No comparta agujas. Solicite ayuda a su mdico si necesita apoyo o informacin para abandonar las drogas. Indicaciones generales Realcese los estudios de rutina de [address], dentales y de Wellsite geologist. Mantngase al da con las vacunas. Infrmele a su mdico si: Se siente deprimido con frecuencia. Alguna vez ha sido vctima de [place] o no se siente seguro en su casa. Resumen Adoptar un estilo de vida saludable y recibir atencin preventiva son importantes para promover la salud y Counsellor. Siga las instrucciones del mdico acerca de una dieta saludable, el ejercicio y la realizacin de pruebas o exmenes para Hotel manager. Siga las instrucciones del mdico con  respecto al control del colesterol y la presin arterial. Esta informacin no tiene Theme park manager el consejo del mdico. Asegrese de hacerle al mdico cualquier pregunta que tenga. Document Revised: 06/25/2020 Document Reviewed: 06/25/2020 Elsevier Patient Education  2024 Elsevier Inc.      Edwina Barth, MD [facility]

## 2022-09-09 NOTE — Assessment & Plan Note (Signed)
Recommend dermatology evaluation Referral placed today

## 2022-09-09 NOTE — Assessment & Plan Note (Signed)
No recent gout flareups Uric acid level done today

## 2022-09-09 NOTE — Assessment & Plan Note (Signed)
Stable and well-controlled on CPAP treatment.

## 2022-09-09 NOTE — Assessment & Plan Note (Signed)
BP Readings from Last 3 Encounters:  09/09/22 128/68  08/03/22 (!) 108/59  08/03/22 133/64  Well-controlled hypertension.  Continue losartan 25 mg daily

## 2022-09-09 NOTE — Assessment & Plan Note (Signed)
Well-controlled.  Presently in sinus rhythm. On no medication.

## 2022-09-09 NOTE — Assessment & Plan Note (Signed)
Diet and nutrition discussed Cardiovascular risks associated with obesity discussed Benefits of exercise discussed Advised to decrease amount of daily carbohydrate intake and daily calories and increase amount of plant-based protein in his diet

## 2022-09-12 ENCOUNTER — Other Ambulatory Visit: Payer: Self-pay | Admitting: Emergency Medicine

## 2022-09-12 DIAGNOSIS — E790 Hyperuricemia without signs of inflammatory arthritis and tophaceous disease: Secondary | ICD-10-CM

## 2022-09-12 DIAGNOSIS — Z8739 Personal history of other diseases of the musculoskeletal system and connective tissue: Secondary | ICD-10-CM

## 2022-09-12 MED ORDER — ALLOPURINOL 100 MG PO TABS
100.0000 mg | ORAL_TABLET | Freq: Every day | ORAL | 3 refills | Status: DC
Start: 2022-09-12 — End: 2023-10-05

## 2022-10-09 ENCOUNTER — Other Ambulatory Visit: Payer: Self-pay | Admitting: Emergency Medicine

## 2022-10-25 ENCOUNTER — Ambulatory Visit: Payer: BC Managed Care – PPO | Admitting: Family Medicine

## 2023-01-06 ENCOUNTER — Other Ambulatory Visit: Payer: Self-pay | Admitting: Internal Medicine

## 2023-01-07 ENCOUNTER — Other Ambulatory Visit: Payer: Self-pay | Admitting: Emergency Medicine

## 2023-02-03 ENCOUNTER — Other Ambulatory Visit: Payer: Self-pay | Admitting: Internal Medicine

## 2023-02-04 ENCOUNTER — Other Ambulatory Visit: Payer: Self-pay | Admitting: Internal Medicine

## 2023-02-04 ENCOUNTER — Other Ambulatory Visit: Payer: Self-pay | Admitting: Nurse Practitioner

## 2023-02-04 MED ORDER — LOSARTAN POTASSIUM 25 MG PO TABS: 25.0000 mg | ORAL_TABLET | Freq: Every day | ORAL | 0 refills | Status: DC

## 2023-02-04 NOTE — Telephone Encounter (Signed)
 Copied from CRM [id number]. Topic: Clinical - Prescription Issue >> Feb 04, 2023  1:31 PM Fonda Kinder J wrote: Reason for CRM: Pt states that his pharmacy still has received an authorization order for his losartan

## 2023-04-17 ENCOUNTER — Other Ambulatory Visit: Payer: Self-pay | Admitting: Emergency Medicine

## 2023-04-25 ENCOUNTER — Ambulatory Visit: Payer: Self-pay

## 2023-04-25 ENCOUNTER — Ambulatory Visit (INDEPENDENT_AMBULATORY_CARE_PROVIDER_SITE_OTHER)

## 2023-04-25 ENCOUNTER — Encounter: Payer: Self-pay | Admitting: Emergency Medicine

## 2023-04-25 ENCOUNTER — Ambulatory Visit (INDEPENDENT_AMBULATORY_CARE_PROVIDER_SITE_OTHER): Admitting: Emergency Medicine

## 2023-04-25 VITALS — BP 120/60 | HR 86 | Temp 102.5°F | Ht 67.0 in | Wt 232.0 lb

## 2023-04-25 DIAGNOSIS — R509 Fever, unspecified: Secondary | ICD-10-CM | POA: Insufficient documentation

## 2023-04-25 DIAGNOSIS — J22 Unspecified acute lower respiratory infection: Secondary | ICD-10-CM | POA: Diagnosis not present

## 2023-04-25 DIAGNOSIS — R5081 Fever presenting with conditions classified elsewhere: Secondary | ICD-10-CM

## 2023-04-25 DIAGNOSIS — I480 Paroxysmal atrial fibrillation: Secondary | ICD-10-CM

## 2023-04-25 DIAGNOSIS — I10 Essential (primary) hypertension: Secondary | ICD-10-CM

## 2023-04-25 LAB — POC COVID19 BINAXNOW: SARS Coronavirus 2 Ag: NEGATIVE

## 2023-04-25 LAB — POCT INFLUENZA A/B
Influenza A, POC: NEGATIVE
Influenza B, POC: NEGATIVE

## 2023-04-25 MED ORDER — AZITHROMYCIN 250 MG PO TABS
ORAL_TABLET | ORAL | 0 refills | Status: DC
Start: 2023-04-25 — End: 2023-06-09

## 2023-04-25 NOTE — Assessment & Plan Note (Signed)
 BP Readings from Last 3 Encounters:  04/25/23 120/60  09/09/22 128/68  08/03/22 (!) 108/59  Well-controlled hypertension.  Continue losartan 25 mg daily

## 2023-04-25 NOTE — Progress Notes (Signed)
 [facility] [name] 63 y.o.   Chief Complaint  Patient presents with   Cough    Patient states having chills 4 days ago, very fatigue, watery eyes, runny nose, low grade fever, slight sore throat. Patient states in the morning when he blows his nose he has a little blood. Patient has not tested for COVID or FLU. Patient has not taking any meds because it makes his stomach upset     HISTORY OF PRESENT ILLNESS: This is a 63 y.o. male complaining of flulike symptoms that started last Friday Complaining of feeling very fatigued, chills, runny nose and slight sore throat Mild cough.  No other associated symptoms  Cough Associated symptoms include chills, a fever and a sore throat. Pertinent negatives include no chest pain, headaches, rash or shortness of breath.     Prior to Admission medications   Medication Sig Start Date End Date Taking? Authorizing Provider  furosemide (LASIX) 20 MG tablet TAKE 1 TABLET BY MOUTH EVERY DAY AS NEEDED 04/17/23  Yes Oluwaseyi Tull, Eilleen Kempf, MD  losartan (COZAAR) 25 MG tablet Take 1 tablet (25 mg total) by mouth daily. 02/04/23  Yes Elenore Paddy, NP  sildenafil (VIAGRA) 100 MG tablet Take 1 tablet (100 mg total) by mouth as needed for erectile dysfunction. 08/23/19  Yes Shail Urbas, Eilleen Kempf, MD  allopurinol (ZYLOPRIM) 100 MG tablet Take 1 tablet (100 mg total) by mouth daily. Patient not taking: Reported on 04/25/2023 09/12/22   Georgina Quint, MD  dicyclomine (BENTYL) 20 MG tablet Take 1 tablet (20 mg total) by mouth 2 (two) times daily. Patient not taking: Reported on 04/25/2023 08/03/22   Gailen Shelter, PA  famotidine (PEPCID) 20 MG tablet Take 1 tablet (20 mg total) by mouth 2 (two) times daily. 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 04/2017 s/p PVI. with ablation 05-06-2017   Atrial flutter (HCC) 2019   with cardioversion   Cataract    left small   GERD (gastroesophageal reflux disease)    past hx   Gout    Pre-diabetes    Sleep apnea    on CPAP"    Past Surgical History:  Procedure Laterality Date   ATRIAL FIBRILLATION ABLATION N/A 05/06/2017   Procedure: ATRIAL FIBRILLATION ABLATION;  Surgeon: Regan Lemming, MD;  Location: [facility] INVASIVE CV LAB;  Service: Cardiovascular;  Laterality: N/A;   CARDIOVERSION N/A 03/04/2017   Procedure: CARDIOVERSION;  Surgeon: Yates Decamp, MD;  Location: [facility] ENDOSCOPY;  Service: Cardiovascular;  Laterality: N/A;   LEFT HEART CATH AND CORONARY ANGIOGRAPHY N/A 05/24/2017   Procedure: LEFT HEART CATH AND CORONARY ANGIOGRAPHY possible angioplasty;  Surgeon: Elder Negus, MD;  Location: [facility] INVASIVE CV LAB;  Service: Cardiovascular;  Laterality: N/A;   TOOTH EXTRACTION  05/2021   [place]   WISDOM TOOTH EXTRACTION     all  4    Social History   Socioeconomic History   Marital status: Married    Spouse name: Not on file   Number of children: 2   Years of education: Not on file   Highest education level: Not on file  Occupational History   Not on file  Tobacco Use   Smoking status: Former    Current packs/day: 0.00    Average packs/day: 1 pack/day for 20.0 years (20.0 ttl pk-yrs)    Types: Cigarettes    Start date: 14    Quit date: 1    Years since quitting: 27.2   Smokeless tobacco: Never  Vaping Use   Vaping status: Never Used  Substance and Sexual  Activity   Alcohol use: Yes    Alcohol/week: 3.0 standard drinks of alcohol    Types: 3 Glasses of wine per week    Comment: occasionally   Drug use: No   Sexual activity: Yes  Other Topics Concern   Not on file  Social History Narrative   ** Merged History Encounter **       Social Drivers of Corporate investment banker Strain: Not on file  Food Insecurity: Not on file  Transportation Needs: Not on file  Physical Activity: Not on file  Stress: Not on file  Social Connections: Not on file  Intimate Partner Violence: Not on file    Family History  Problem Relation Age of Onset   Diabetes Mother    Hyperlipidemia Mother    Diabetes Father    Hyperlipidemia Father    Esophageal cancer Father    Drug abuse Sister    Pancreatic cancer Sister    Hyperlipidemia Maternal Grandmother    Colon polyps Niece    Colon cancer Neg Hx    Prostate cancer Neg Hx    Rectal cancer Neg Hx    Stomach cancer Neg Hx      Review of Systems  Constitutional:  Positive for chills, fever and malaise/fatigue.  HENT:  Positive for congestion and sore throat.   Respiratory:  Positive for cough. Negative for sputum production and shortness of breath.   Cardiovascular: Negative.  Negative for chest pain and palpitations.  Gastrointestinal:  Negative for abdominal pain, diarrhea, nausea and vomiting.  Genitourinary: Negative.  Negative for dysuria and hematuria.  Skin:  Negative for rash.  Neurological: Negative.  Negative for dizziness and headaches.    Vitals:   04/25/23 1531  BP: 120/60  Pulse: 86  Temp: (!) 102.5 F (39.2 C)  SpO2: 94%    Physical Exam Constitutional:      Appearance: Normal appearance.  HENT:     Head: Normocephalic.     Mouth/Throat:     Mouth: Mucous membranes are moist.     Pharynx: Oropharynx is clear.  Eyes:     Extraocular Movements: Extraocular movements intact.     Conjunctiva/sclera: Conjunctivae normal.     Pupils: Pupils are equal, round, and  reactive to light.  Cardiovascular:     Rate and Rhythm: Normal rate and regular rhythm.     Pulses: Normal pulses.     Heart sounds: Normal heart sounds.  Pulmonary:     Effort: Pulmonary effort is normal.     Breath sounds: Rhonchi and rales (Left mid and lower posterior fields) present.  Abdominal:     Palpations: Abdomen is soft.     Tenderness: There is no abdominal tenderness.  Musculoskeletal:     Cervical back: No tenderness.  Lymphadenopathy:     Cervical: No cervical adenopathy.  Skin:    General: Skin is warm and dry.     Capillary Refill: Capillary refill takes less than 2 seconds.  Neurological:     General: No focal deficit present.     Mental Status: He is alert and oriented to person, place, and time.  Psychiatric:        Mood and Affect: Mood normal.        Behavior: Behavior normal.    Results for orders placed or performed in visit on 04/25/23 (from the past 24 hours)  POC COVID-19     Status: None   Collection Time: 04/25/23  4:46 PM  Result Value Ref Range   SARS Coronavirus 2 Ag Negative Negative  POCT Influenza A/B     Status: None   Collection Time: 04/25/23  4:46 PM  Result Value Ref Range   Influenza A, POC Negative Negative   Influenza B, POC Negative Negative     ASSESSMENT & PLAN: A total of 43 minutes was spent with the patient and counseling/coordination of care regarding preparing for this visit, review of most recent office visit notes, review of multiple chronic medical conditions and their management, diagnosis of lower respiratory infection and need for antibiotics, review of all medications, review of most recent bloodwork results, review of health maintenance items, education on nutrition, prognosis, documentation, and need for follow up.   Problem List Items Addressed This Visit       Cardiovascular and Mediastinum   Paroxysmal atrial fibrillation (HCC)   Well-controlled.  Presently in sinus rhythm. On no medication      HTN  (hypertension)   BP Readings from Last 3 Encounters:  04/25/23 120/60  09/09/22 128/68  08/03/22 (!) 108/59  Well-controlled hypertension.  Continue losartan 25 mg daily           Respiratory   Lower respiratory infection - Primary   Pneumonia on physical examination Febrile Chest x-ray done today.  Will review images when done Recommend daily azithromycin for 5 days Advised to rest and stay well-hydrated Work note provided to stay home Advised to contact the office if no better or worse during the next several days      Relevant Medications   azithromycin (ZITHROMAX) 250 MG tablet   Other Relevant Orders   POC COVID-19 (Completed)   POCT Influenza A/B (Completed)     Other   Fever   Fever instructions given. May take Tylenol and or Advil as needed Negative COVID and flu test Abnormal physical findings on lung examination      Relevant Orders   DG Chest 2 View   Patient Instructions  Fiebre en los adultos Fever, Adult     La fiebre es una temperatura corporal alta de 100.4 F (38 C) o superior. La fiebre leve o moderada y de corta duracin en general no tiene efectos a Air cabin crew. A menudo no requiere tratamiento. La fiebre moderada o alta puede Company secretary. A veces, pueden ser un signo de que hay un problema grave. Una fiebre que vuelve una y otra vez o que dura mucho tiempo puede causar la prdida de agua en el cuerpo (deshidratacin). Puede utilizar un termmetro para verificar la presencia de [place]. La temperatura puede variar segn: La edad. El momento del da. Dnde se toma la temperatura, por ejemplo: En la boca. En el orificio [employer] (ano). En el odo. Debajo del brazo. En la frente. Siga estas  indicaciones en su casa: Medicamentos Use los medicamentos de venta libre y los recetados solamente como se lo haya indicado el mdico. Siga las instrucciones sobre la cantidad de medicamentos que debe usar y con Engineer, structural. Si le  recetaron antibiticos, tmelos como se lo haya indicado el mdico. No deje de tomarlos aunque comience a sentirse mejor. Indicaciones generales Controle si hay algn cambio en sus sntomas. Dgale a su mdico sobre ellos. Descanse todo lo que sea necesario. Beba suficiente lquido para Radio producer pis (la orina) de color amarillo plido. Dese un bao de inmersin o de esponja con agua a temperatura ambiente segn sea necesario. Esto ayuda a Teaching laboratory technician. No use agua fra. No use demasiadas mantas ni use ropa que sea demasiado pesada. Debe quedarse en su casa y no concurrir al Aleen Campi o a lugares pblicos durante al menos 24 horas despus de la desaparicin de la fiebre. La fiebre debera desaparecer sin necesidad de usar medicamentos. Comunquese con un mdico si: Tiene vmitos o deposiciones acuosas (diarrea) que no mejoran. No puede comer ni beber sin vomitar. Le duele al hacer pis. Tiene sntomas que no mejoran con el tratamiento o tiene sntomas nuevos. Tiene una erupcin cutnea en la piel. Presenta signos de no tener suficiente agua en el cuerpo, por ejemplo: Larose [place], muy escasa o falta de [place]. Labios agrietados o Building surveyor. Ojos hundidos. Somnolencia. Debilidad. Solicite ayuda de inmediato si: Siente falta de aire o dificultad para respirar. Se siente mareado o se desvanece (se desmaya). Se siente desorientado o confundido. Tiene dolor muy intenso en el vientre (abdomen). Estos sntomas pueden Customer service manager. Solicite ayuda de inmediato. Llame al 911. No espere a ver si los sntomas desaparecen. No conduzca por sus propios medios [employer]. Esta informacin no tiene Theme park manager el consejo del mdico. Asegrese de hacerle al mdico cualquier pregunta que tenga. Document Revised: 11/16/2021 Document Reviewed: 11/16/2021 Elsevier Patient Education  2024 Elsevier Inc.    Edwina Barth, MD [facility]

## 2023-04-25 NOTE — Assessment & Plan Note (Signed)
 Pneumonia on physical examination Febrile Chest x-ray done today.  Will review images when done Recommend daily azithromycin for 5 days Advised to rest and stay well-hydrated Work note provided to stay home Advised to contact the office if no better or worse during the next several days

## 2023-04-25 NOTE — Assessment & Plan Note (Signed)
 Fever instructions given. May take Tylenol and or Advil as needed Negative COVID and flu test Abnormal physical findings on lung examination

## 2023-04-25 NOTE — Telephone Encounter (Signed)
  Chief Complaint: cough Symptoms: cough, watery eyes, runny nose, poss fever Frequency: 3 days Pertinent Negatives: Patient denies SOB Disposition: [] ED /[] Urgent Care (no appt availability in office) / [x] Appointment(In office/virtual)/ []  [facility]/ [] Home Care/ [] Refused Recommended Disposition /[] [facility]/ []  Follow-up with PCP Additional Notes: Interpreter Polly ID [id number], patient reporting worsening cough, yellow phlegm, watery eyes, runny nose x 3 days. States he feels the cough is constant and severe. Reports he may have a fever, but has not checked temperature. Has not tried any OTC medications. Denies SOB, CP. Per protocol, patient to be evaluated within 24 hours. First available appointment with PCP today at 1520, patient scheduled accordingly. Care advice reviewed, patient verbalized understanding and denies further questions at this time. Alerting PCP for review.    Reason for Disposition  SEVERE coughing spells (e.g., whooping sound after coughing, vomiting after coughing)  Answer Assessment - Initial Assessment Questions 1. ONSET: "When did the cough begin?"      3 days 2. SEVERITY: "How bad is the cough today?"      Consistent 3. SPUTUM: "Describe the color of your sputum" (none, dry cough; clear, white, yellow, green)     Phlegm, yellow 4. HEMOPTYSIS: "Are you coughing up any blood?" If so ask: "How much?" (flecks, streaks, tablespoons, etc.)     Denies 5. DIFFICULTY BREATHING: "Are you having difficulty breathing?" If Yes, ask: "How bad is it?" (e.g., mild, moderate, severe)    - MILD: No SOB at rest, mild SOB with walking, speaks normally in sentences, can lie down, no retractions, pulse < 100.    - MODERATE: SOB at rest, SOB with minimal exertion and prefers to sit, cannot lie down flat, speaks in phrases, mild retractions, audible wheezing, pulse 100-120.    - SEVERE: Very SOB at rest, speaks in single words, struggling to breathe, sitting  hunched forward, retractions, pulse > 120      Denies 6. FEVER: "Do you have a fever?" If Yes, ask: "What is your temperature, how was it measured, and when did it start?"     Feels warm, has not taken temperature. 7. CARDIAC HISTORY: "Do you have any history of heart disease?" (e.g., heart attack, congestive heart failure)      Afib 8. LUNG HISTORY: "Do you have any history of lung disease?"  (e.g., pulmonary embolus, asthma, emphysema)     Denies 9. PE RISK FACTORS: "Do you have a history of blood clots?" (or: recent major surgery, recent prolonged travel, bedridden)     Denies 10. OTHER SYMPTOMS: "Do you have any other symptoms?" (e.g., runny nose, wheezing, chest pain)       Runny nose, watery eyes  12. TRAVEL: "Have you traveled out of the country in the last month?" (e.g., travel history, exposures)       Denies  Protocols used: Cough - Acute Non-Productive-A-AH

## 2023-04-25 NOTE — Patient Instructions (Signed)
 Fiebre en los adultos Fever, Adult     La fiebre es una temperatura corporal alta de 100.4 F (38 C) o superior. La fiebre leve o moderada y de corta duracin en general no tiene efectos a Air cabin crew. A menudo no requiere tratamiento. La fiebre moderada o alta puede Company secretary. A veces, pueden ser un signo de que hay un problema grave. Una fiebre que vuelve una y otra vez o que dura mucho tiempo puede causar la prdida de agua en el cuerpo (deshidratacin). Puede utilizar un termmetro para verificar la presencia de [place]. La temperatura puede variar segn: La edad. El momento del da. Dnde se toma la temperatura, por ejemplo: En la boca. En el orificio [employer] (ano). En el odo. Debajo del brazo. En la frente. Siga estas indicaciones en su casa: Medicamentos Use los medicamentos de venta libre y los recetados solamente como se lo haya indicado el mdico. Siga las instrucciones sobre la cantidad de medicamentos que debe usar y con Engineer, structural. Si le recetaron antibiticos, tmelos como se lo haya indicado el mdico. No deje de tomarlos aunque comience a sentirse mejor. Indicaciones generales Controle si hay algn cambio en sus sntomas. Dgale a su mdico sobre ellos. Descanse todo lo que sea necesario. Beba suficiente lquido para Radio producer pis (la orina) de color amarillo plido. Dese un bao de inmersin o de esponja con agua a temperatura ambiente segn sea necesario. Esto ayuda a Teaching laboratory technician. No use agua fra. No use demasiadas mantas ni use ropa que sea demasiado pesada. Debe quedarse en su casa y no concurrir al Aleen Campi o a lugares pblicos durante al menos 24 horas despus de la desaparicin de la fiebre. La fiebre debera desaparecer sin necesidad de usar medicamentos. Comunquese con un mdico si: Tiene vmitos o deposiciones acuosas (diarrea) que no mejoran. No puede comer ni beber sin vomitar. Le duele al hacer pis. Tiene sntomas  que no mejoran con el tratamiento o tiene sntomas nuevos. Tiene una erupcin cutnea en la piel. Presenta signos de no tener suficiente agua en el cuerpo, por ejemplo: Larose [place], muy escasa o falta de [place]. Labios agrietados o Building surveyor. Ojos hundidos. Somnolencia. Debilidad. Solicite ayuda de inmediato si: Siente falta de aire o dificultad para respirar. Se siente mareado o se desvanece (se desmaya). Se siente desorientado o confundido. Tiene dolor muy intenso en el vientre (abdomen). Estos sntomas pueden Customer service manager. Solicite ayuda de inmediato. Llame al 911. No espere a ver si los sntomas desaparecen. No conduzca por sus propios medios [employer]. Esta informacin no tiene Theme park manager el consejo del mdico. Asegrese de hacerle al mdico cualquier pregunta que tenga. Document Revised: 11/16/2021 Document Reviewed: 11/16/2021 Elsevier Patient Education  2024 [employer].

## 2023-04-25 NOTE — Assessment & Plan Note (Signed)
Well-controlled.  Presently in sinus rhythm. On no medication.

## 2023-04-26 ENCOUNTER — Encounter: Payer: Self-pay | Admitting: Emergency Medicine

## 2023-05-04 ENCOUNTER — Other Ambulatory Visit: Payer: Self-pay | Admitting: Nurse Practitioner

## 2023-05-25 ENCOUNTER — Ambulatory Visit: Payer: BC Managed Care – PPO | Admitting: Neurology

## 2023-05-25 ENCOUNTER — Encounter: Payer: Self-pay | Admitting: Neurology

## 2023-05-25 VITALS — BP 142/71 | HR 64 | Ht 67.0 in | Wt 234.6 lb

## 2023-05-25 DIAGNOSIS — I499 Cardiac arrhythmia, unspecified: Secondary | ICD-10-CM

## 2023-05-25 DIAGNOSIS — R5383 Other fatigue: Secondary | ICD-10-CM

## 2023-05-25 DIAGNOSIS — Z8679 Personal history of other diseases of the circulatory system: Secondary | ICD-10-CM | POA: Diagnosis not present

## 2023-05-25 DIAGNOSIS — G4733 Obstructive sleep apnea (adult) (pediatric): Secondary | ICD-10-CM

## 2023-05-25 NOTE — Progress Notes (Signed)
 Subjective:    Patient ID: Evan Page is a 63 y.o. male.  HPI    Interim history:   Evan Page is a 63 year old right-handed gentleman with an underlying medical history of atrial fibrillation, hyperlipidemia, and obesity, who presents for follow-up consultation of his obstructive sleep apnea, on CPAP therapy. The patient is accompanied by a Spanish interpreter today.  I saw him in April 2024, at which time he was compliant with his CPAP at 9 cm with good apnea control and good results.  He was advised to follow-up in 1 year routinely.  Today, 05/25/2023: I reviewed his CPAP compliance data from 04/24/2023 through 05/23/2023, which is a total of 30 days, during which time he used his machine every night with percent use days greater than 4 hours at 100%, indicating superb compliance with an average usage of 7 hours and 4 minutes, residual AHI at goal at 0.3/h, leak on the low side with a 95th percentile at 0.3 L/min on a pressure of 9 cm with EPR of 3.  He reports overall doing well with his machine, he is up-to-date with his supplies, he uses nasal pillows.  He has daytime tiredness particularly in the afternoons.  He does work long hours and also to additional part-time jobs besides his full-time job.  He has had fatigue in the afternoons and sometimes feels like he needs to nap.  He has not seen cardiology in some time, has a history of atrial fibrillation with status post ablation in 2019.  The patient's allergies, current medications, family history, past medical history, past social history, past surgical history and problem list were reviewed and updated as appropriate.     Previously:  05/25/2022: I reviewed his CPAP compliance data from 04/25/2022 through 05/24/2022 which is a total of 30 days, during which time he used his machine every night with percent he was days greater than 4 hours at 100%, indicating superb compliance with an average usage of 7 hours and 9  minutes, residual AHI at goal at 0.4/h.  Leak on the low side with a 95th percentile at 0 L/min, pressure at 9 cm with EPR of 3.  He reports that he was told to come back in April for a new machine.  He has no problem with his current machine, set up date was 05/30/2017.  He does not necessarily want a new machine, it is working well and he has no concerns.  He was under the impression after the last visit that he was supposed to come back in April.  He does have a 1 year follow-up pending with the nurse practitioner and realizes this.  It may have been a miscommunication and I explained to him that if he wants a new machine we can certainly pursue it, he should be eligible by late April 2024 but if he is not keen on getting a new machine right away, we can also wait and let him continue using the current machine and consider a home sleep test next year for reevaluation and getting him a machine next year.  He is planning a trip to [place] in the near future and is wondering if he should get a travel machine or take this machine.  He is advised that he can certainly travel with his home CPAP machine.       I saw him on 10/29/2020, at which time he was compliant with his CPAP of 9 cm with EPR of 3.  He reported doing well.  We talked about inspire and I explained this treatment option to him as he had several questions.  He reported a recent gout flareup in June 2022.   He saw Evan Fick, NP on 10/29/2021, at which time he was fully compliant with his PAP machine and doing well.  He was advised to follow-up routinely in 1 year.    I saw him on 10/29/2019, at which time he was compliant with his CPAP.  He was using an ozone based CPAP cleaner.  He was up-to-date with his supplies.  He was recently found to have diverticulosis.  He had a checkup with his cardiologist and was deemed stable.   I saw him on 10/20/2017, at which time he was compliant with his CPAP and reported feeling better.  He had a recent ablation  procedure for his A. fib as well as cardioversion.  He did not have an appointment last year.   I reviewed his CPAP compliance data from 09/25/2019 through 10/24/2019, which is a total of 30 days, during which time he used his machine every night with percent use days greater than 4 hours at 100%, indicating superb compliance, average usage of 7 hours and 12 minutes, residual AHI at goal at 0.5/h, leak on the low side with a 95th percentile at 1.4 L/min, pressure of 9 cm with EPR of 3.     I first met him on 03/10/2017 at the request of his cardiologist, at which time patient reported snoring and daytime somnolence as well as breathing pauses while asleep. He was advised to proceed with a sleep study. He had a split-night sleep study on 04/21/2017. I went over his test results with him in detail today. Sleep efficiency was 89% at baseline with a sleep latency of 8.5 minutes and REM latency of 54.5 minutes. He had a total AHI of 38.6 per hour, EKG showed PVCs and A. fib. His REM AHI was 64 per hour, supine AHI 60.6 per hour, average oxygen saturation of only 88% with a nadir of 67%, he had no significant PLMS. He was started on CPAP at 5 cm via nasal pillows and advanced 8 cm, and the final pressure his AHI was 2.2 per hour with nonsupine REM sleep achieved an O2 nadir of 89%. I suggested a home treatment pressure of 9 cm. I reviewed his CPAP compliance data from 09/19/2017 through 10/18/2017 which is a total of 30 days, during which time he used his CPAP 29 days with percent used days greater than 4 hours at 97%, indicating excellent compliance with an average usage of 6 hours and 30 minutes, residual AHI at goal at 1.4 per hour, leak acceptable on the low side with the 95th percentile at 5.2 L/m on a pressure of 9 cm with EPR of 3.      03/10/2017: (He) reports snoring and excessive daytime somnolence. I reviewed your office note from 02/13/2017, which you kindly included. His Epworth sleepiness score is 20  out of 24 today, fatigue score is 58 out of 63. He reports loud snoring and also positive in his breathing per girlfriend report. He lives alone, girlfriend lives in [place]. He has one son and one daughter. His son lives with them along with his family which includes 2 children. Patient bedtime is around 9, rise time around 4. He has to be at work at 5. 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 He has nocturia about twice per average night. He has had occasional  morning headaches. He drinks caffeine in the form of coffee, usually just one cup per day, typically no sodas. He quit smoking some 20 years ago. He does drink occasional wine, 1-2 glasses, namely on weekends, not daily. Of note, the patient had a elective cardioversion on 03/04/2017. I reviewed the records. He was in sinus rhythm after his cardioversion.   His Past Medical History Is Significant For: Past Medical History:  Diagnosis Date   Atrial fibrillation (HCC)    a. 04/2017 s/p PVI. with ablation 05-06-2017   Atrial flutter (HCC) 2019   with cardioversion   Cataract    left small   GERD (gastroesophageal reflux disease)    past hx   Gout    Pre-diabetes    Sleep apnea    on CPAP"    His Past Surgical History Is Significant For: Past Surgical History:  Procedure Laterality Date   ATRIAL FIBRILLATION ABLATION N/A 05/06/2017   Procedure: ATRIAL FIBRILLATION ABLATION;  Surgeon: Lei Pump, MD;  Location: [facility] INVASIVE [facility];  Service: Cardiovascular;  Laterality: N/A;   CARDIOVERSION N/A 03/04/2017   Procedure: CARDIOVERSION;  Surgeon: Knox Perl, MD;  Location: [facility] ENDOSCOPY;  Service: Cardiovascular;  Laterality: N/A;   LEFT HEART CATH AND CORONARY ANGIOGRAPHY N/A 05/24/2017   Procedure: LEFT HEART CATH AND CORONARY ANGIOGRAPHY possible angioplasty;  Surgeon: Cody Das, MD;  Location: [facility] INVASIVE CV LAB;  Service: Cardiovascular;  Laterality: N/A;   TOOTH EXTRACTION  05/2021   [facility]   WISDOM  TOOTH EXTRACTION     all 4    His Family History Is Significant For: Family History  Problem Relation Age of Onset   Diabetes Mother    Hyperlipidemia Mother    Diabetes Father    Hyperlipidemia Father    Esophageal cancer Father    Drug abuse Sister    Pancreatic cancer Sister    Hyperlipidemia Maternal Grandmother    Colon polyps Niece    Colon cancer Neg Hx    Prostate cancer Neg Hx    Rectal cancer Neg Hx    Stomach cancer Neg Hx     His Social History Is Significant For: Social History   Socioeconomic History   Marital status: Married    Spouse name: Not on file   Number of children: 2   Years of education: Not on file   Highest education level: Not on file  Occupational History   Not on file  Tobacco Use   Smoking status: Former    Current packs/day: 0.00    Average packs/day: 1 pack/day for 20.0 years (20.0 ttl pk-yrs)    Types: Cigarettes    Start date: 2    Quit date: 1998    Years since quitting: 27.3   Smokeless tobacco: Never  Vaping Use   Vaping status: Never Used  Substance and Sexual Activity   Alcohol use: Yes    Alcohol/week: 3.0 standard drinks of alcohol    Types: 3 Glasses of wine per week    Comment: occasionally   Drug use: No   Sexual activity: Yes  Other Topics Concern   Not on file  Social History Narrative   ** Merged History Encounter **       Social Drivers of Corporate investment banker Strain: Not on file  Food Insecurity: Not on file  Transportation Needs: Not on file  Physical Activity: Not on file  Stress: Not on file  Social Connections: Not on file  His Allergies Are:  No Known Allergies:   His Current Medications Are:  Outpatient Encounter Medications as of 05/25/2023  Medication Sig   famotidine  (PEPCID ) 20 MG tablet Take 1 tablet (20 mg total) by mouth 2 (two) times daily.   furosemide  (LASIX ) 20 MG tablet TAKE 1 TABLET BY MOUTH EVERY DAY AS NEEDED   losartan  (COZAAR ) 25 MG tablet TAKE 1 TABLET (25 MG  TOTAL) BY MOUTH DAILY.   sildenafil  (VIAGRA ) 100 MG tablet Take 1 tablet (100 mg total) by mouth as needed for erectile dysfunction.   allopurinol  (ZYLOPRIM ) 100 MG tablet Take 1 tablet (100 mg total) by mouth daily. (Patient not taking: Reported on 05/25/2023)   azithromycin  (ZITHROMAX ) 250 MG tablet Sig as indicated (Patient not taking: Reported on 05/25/2023)   dicyclomine  (BENTYL ) 20 MG tablet Take 1 tablet (20 mg total) by mouth 2 (two) times daily. (Patient not taking: Reported on 09/09/2022)   No facility-administered encounter medications on file as of 05/25/2023.  :  Review of Systems:  Out of a complete 14 point review of systems, all are reviewed and negative with the exception of these symptoms as listed below:  Review of Systems  Neurological:        Room 5 Pt is here Alone. Pt states that things have been pretty good with his CPAP Machine. Pt states sometimes when he is resting and sitting down, he will get very tired out of nowhere and he thinks his brain is not getting enough oxygen. Pt states that when he is sleeping he feels as though he is getting enough oxygen. ESS 15 FSS 44    Objective:  Neurological Exam  Physical Exam Physical Examination:   Vitals:   05/25/23 0947  BP: (!) 142/71  Pulse: 64    General Examination: The patient is a very pleasant 63 y.o. male in no acute distress. He appears well-developed and well-nourished and well groomed.   HEENT: Normocephalic, atraumatic, pupils are equal, round and reactive to light, corrective eyeglasses in place. Extraocular tracking is well-preserved, no obvious nystagmus noted.  Hearing is grossly intact.  Face is symmetric with normal facial animation. Speech is clear with no dysarthria noted. There is no hypophonia. There is no lip, neck/head, jaw or voice tremor. Neck is supple with full range of passive and active motion. Oropharynx exam reveals: mild mouth dryness, good dental hygiene and moderate airway crowding.  Tongue protrudes centrally and palate elevates symmetrically, overall stable exam.   Chest: Clear to auscultation without wheezing, rhonchi or crackles noted.   Heart: S1+S2+0, slightly irregular, ?PVC with pause versus irregularly irregular.     Abdomen: Soft, non-tender and non-distended.   Extremities: There is no obvious edema in the distal lower extremities bilaterally.    Skin: Warm and dry without trophic changes noted.   Musculoskeletal: exam reveals no obvious joint deformities.    Neurologically:  Mental status: The patient is awake, alert and oriented in all 4 spheres. His immediate and remote memory, attention, language skills and fund of knowledge are appropriate. There is no evidence of aphasia, agnosia, apraxia or anomia. Speech is clear with normal prosody and enunciation. Thought process is linear. Mood is normal and affect is normal.  Cranial nerves II - XII are as described above under HEENT exam.  Motor exam: Normal bulk, strength and tone is noted. There is no resting or action tremor, fine motor skills and coordination: grossly intact.  Cerebellar testing: No dysmetria or intention tremor. There is no truncal  or gait ataxia.  Sensory exam: intact to light touch in the upper and lower extremities.  Gait, station and balance: He stands easily. No veering to one side is noted. No leaning to one side is noted. Posture is age-appropriate and stance is narrow based. Gait shows normal stride length and normal pace. Stable exam.  Assessment and Plan:  In summary, [facility] is a very pleasant 63 year old right-handed gentleman with an underlying medical history of atrial fibrillation, hyperlipidemia, and obesity, who presents for follow-up consultation of his obstructive sleep apnea, well-established on CPAP therapy with full compliance and good results, good apnea control.  He is commended for his treatment adherence.  Given his daytime fatigue and tiredness as  well as slightly irregular heartbeat today, he is advised to get in touch with his primary care to get checked out, not sure if he could be back in atrial fibrillation or if he has intermittent PVCs.  He is encouraged to consider seeing cardiology again as well.  We talked about the importance of staying well-hydrated and more rested and making enough time for sleep.  He is encouraged to try to strive for 8 hours of sleep on any given night but he does work multiple jobs and long hours.  From the sleep apnea standpoint he is advised to routinely follow-up to see one of our nurse practitioners in about a year.  I answered all his questions today and he was in agreement.   I spent 40 minutes in total face-to-face time and in reviewing records during pre-charting, more than 50% of which was spent in counseling and coordination of care, reviewing test results, reviewing medications and treatment regimen and/or in discussing or reviewing the diagnosis of OSA, irregular heartbeat, the prognosis and treatment options. Pertinent laboratory and imaging test results that were available during this visit with the patient were reviewed by me and considered in my medical decision making (see chart for details).

## 2023-05-25 NOTE — Patient Instructions (Addendum)
 Please continue using your CPAP regularly. While your insurance requires that you use CPAP at least 4 hours each night on 70% of the nights, I recommend, that you not skip any nights and use it throughout the night if you can. Getting used to CPAP and staying with the treatment long term does take time and patience and discipline. Untreated obstructive sleep apnea when it is moderate to severe can have an adverse impact on cardiovascular health and raise her risk for heart disease, arrhythmias, hypertension, congestive heart failure, stroke and diabetes. Untreated obstructive sleep apnea causes sleep disruption, nonrestorative sleep, and sleep deprivation. This can have an impact on your day to day functioning and cause daytime sleepiness and impairment of cognitive function, memory loss, mood disturbance, and problems focussing. Using CPAP regularly can improve these symptoms.  Your heart rate is a little irregular today. Since you have a history of atrial fibrillation and have not seen cardiology, you should get checked out. I would like for you to get in touch with your PCP today to see if you can be seen for an EKG and you may need a referral back to Dr. Berry Bristol.   From the sleep apnea standpoint, you are doing rather well, we can see you in 1 year, you can see one of our nurse practitioners as you are stable. I will see you after that.

## 2023-05-26 ENCOUNTER — Encounter: Payer: Self-pay | Admitting: Emergency Medicine

## 2023-05-26 ENCOUNTER — Ambulatory Visit: Admitting: Emergency Medicine

## 2023-05-26 ENCOUNTER — Telehealth: Payer: Self-pay

## 2023-05-26 ENCOUNTER — Other Ambulatory Visit (HOSPITAL_COMMUNITY): Payer: Self-pay

## 2023-05-26 ENCOUNTER — Ambulatory Visit: Attending: Emergency Medicine

## 2023-05-26 VITALS — BP 134/68 | HR 78 | Temp 98.6°F | Ht 67.0 in | Wt 233.0 lb

## 2023-05-26 DIAGNOSIS — G4733 Obstructive sleep apnea (adult) (pediatric): Secondary | ICD-10-CM

## 2023-05-26 DIAGNOSIS — R009 Unspecified abnormalities of heart beat: Secondary | ICD-10-CM

## 2023-05-26 DIAGNOSIS — R002 Palpitations: Secondary | ICD-10-CM | POA: Diagnosis not present

## 2023-05-26 DIAGNOSIS — I10 Essential (primary) hypertension: Secondary | ICD-10-CM

## 2023-05-26 DIAGNOSIS — I480 Paroxysmal atrial fibrillation: Secondary | ICD-10-CM

## 2023-05-26 LAB — CBC WITH DIFFERENTIAL/PLATELET
Basophils Absolute: 0.1 10*3/uL (ref 0.0–0.1)
Basophils Relative: 0.8 % (ref 0.0–3.0)
Eosinophils Absolute: 0.1 10*3/uL (ref 0.0–0.7)
Eosinophils Relative: 0.9 % (ref 0.0–5.0)
HCT: 44.7 % (ref 39.0–52.0)
Hemoglobin: 14.6 g/dL (ref 13.0–17.0)
Lymphocytes Relative: 36.1 % (ref 12.0–46.0)
Lymphs Abs: 3 10*3/uL (ref 0.7–4.0)
MCHC: 32.6 g/dL (ref 30.0–36.0)
MCV: 104 fl — ABNORMAL HIGH (ref 78.0–100.0)
Monocytes Absolute: 0.7 10*3/uL (ref 0.1–1.0)
Monocytes Relative: 7.9 % (ref 3.0–12.0)
Neutro Abs: 4.5 10*3/uL (ref 1.4–7.7)
Neutrophils Relative %: 54.3 % (ref 43.0–77.0)
Platelets: 241 10*3/uL (ref 150.0–400.0)
RBC: 4.3 Mil/uL (ref 4.22–5.81)
RDW: 13.9 % (ref 11.5–15.5)
WBC: 8.3 10*3/uL (ref 4.0–10.5)

## 2023-05-26 LAB — COMPREHENSIVE METABOLIC PANEL WITH GFR
ALT: 42 U/L (ref 0–53)
AST: 33 U/L (ref 0–37)
Albumin: 4.3 g/dL (ref 3.5–5.2)
Alkaline Phosphatase: 47 U/L (ref 39–117)
BUN: 24 mg/dL — ABNORMAL HIGH (ref 6–23)
CO2: 26 meq/L (ref 19–32)
Calcium: 9.7 mg/dL (ref 8.4–10.5)
Chloride: 104 meq/L (ref 96–112)
Creatinine, Ser: 1.36 mg/dL (ref 0.40–1.50)
GFR: 55.26 mL/min — ABNORMAL LOW (ref >60.00)
Glucose, Bld: 82 mg/dL (ref 70–99)
Potassium: 3.8 meq/L (ref 3.5–5.1)
Sodium: 138 meq/L (ref 135–145)
Total Bilirubin: 0.4 mg/dL (ref 0.2–1.2)
Total Protein: 7.8 g/dL (ref 6.0–8.3)

## 2023-05-26 LAB — MAGNESIUM: Magnesium: 2.2 mg/dL (ref 1.5–2.5)

## 2023-05-26 LAB — LIPID PANEL
Cholesterol: 154 mg/dL (ref 0–200)
HDL: 42.1 mg/dL (ref >39.00)
LDL Cholesterol: 65 mg/dL (ref 0–99)
NonHDL: 111.45
Total CHOL/HDL Ratio: 4
Triglycerides: 232 mg/dL — ABNORMAL HIGH (ref 0.0–149.0)
VLDL: 46.4 mg/dL — ABNORMAL HIGH (ref 0.0–40.0)

## 2023-05-26 LAB — HEMOGLOBIN A1C: Hgb A1c MFr Bld: 6.4 % (ref 4.6–6.5)

## 2023-05-26 MED ORDER — TADALAFIL 5 MG PO TABS
5.0000 mg | ORAL_TABLET | Freq: Every day | ORAL | 11 refills | Status: DC
Start: 2023-05-26 — End: 2023-10-06

## 2023-05-26 NOTE — Progress Notes (Signed)
 [facility] [name] 63 y.o.   Chief Complaint  Patient presents with   abdormal heart rate     Patient states he saw neurologist yesterday and they told him he had an abnormal Hr states he does have Afib. Patient states he has been experiencing moments of tiredness that lasts for 4-5 mins then it goes away for about 3-4 months     HISTORY OF PRESENT ILLNESS: This is a 63 y.o. male went to see a neurologist yesterday and told he had an abnormal heart rhythm Patient has a history of paroxysmal atrial fibrillation and occasionally gets episodes of abnormal rhythm  HPI   Prior to Admission medications   Medication Sig Start Date End Date Taking? Authorizing Provider  famotidine  (PEPCID ) 20 MG tablet Take 1 tablet (20 mg total) by mouth 2 (two) times daily. 08/03/22  Yes Fondaw, Wylder S, PA  furosemide  (LASIX ) 20 MG tablet TAKE 1 TABLET BY MOUTH EVERY DAY AS NEEDED 04/17/23  Yes Amiere Cawley, Isidro Margo, MD  losartan  (COZAAR ) 25 MG tablet TAKE 1 TABLET (25 MG TOTAL) BY MOUTH DAILY. 05/04/23  Yes Skylyn Slezak, Isidro Margo, MD  sildenafil  (VIAGRA ) 100 MG tablet Take 1 tablet (100 mg total) by mouth as needed for erectile dysfunction. 08/23/19  Yes Kloee Ballew, Isidro Margo, MD  allopurinol  (ZYLOPRIM ) 100 MG tablet Take 1 tablet (100 mg total) by mouth daily. Patient not taking: Reported on 04/25/2023 09/12/22   Elvira Hammersmith, MD  azithromycin  (ZITHROMAX ) 250 MG tablet Sig as indicated Patient not taking: Reported on 05/26/2023 04/25/23   Ahmir Bracken Humberto, MD  dicyclomine  (BENTYL ) 20 MG tablet Take 1 tablet (20 mg total) by mouth 2 (two) times daily. 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 04/2017 s/p PVI. with ablation 05-06-2017   Atrial flutter (HCC) 2019   with cardioversion   Cataract    left small   GERD (gastroesophageal reflux disease)    past hx   Gout    Pre-diabetes    Sleep apnea    on CPAP"    Past Surgical History:  Procedure Laterality Date   ATRIAL FIBRILLATION ABLATION N/A 05/06/2017   Procedure: ATRIAL FIBRILLATION ABLATION;  Surgeon: Lei Pump, MD;  Location: [facility] INVASIVE CV LAB;  Service: Cardiovascular;  Laterality: N/A;   CARDIOVERSION N/A 03/04/2017   Procedure: CARDIOVERSION;  Surgeon: Knox Perl, MD;  Location: [facility] ENDOSCOPY;  Service: Cardiovascular;  Laterality: N/A;   LEFT HEART CATH AND CORONARY ANGIOGRAPHY N/A 05/24/2017   Procedure: LEFT HEART CATH AND CORONARY ANGIOGRAPHY possible angioplasty;  Surgeon: Cody Das, MD;  Location: [facility] INVASIVE CV LAB;  Service: Cardiovascular;  Laterality: N/A;   TOOTH EXTRACTION  05/2021   [place]   WISDOM TOOTH EXTRACTION     all 4    Social History   Socioeconomic History   Marital status: Married  Spouse name: Not on file   Number of children: 2   Years of education: Not on file   Highest education level: Not on file  Occupational History   Not on file  Tobacco Use   Smoking status: Former    Current packs/day: 0.00    Average packs/day: 1 pack/day for 20.0 years (20.0 ttl pk-yrs)    Types: Cigarettes    Start date: 64    Quit date: 55    Years since quitting: 27.3   Smokeless tobacco: Never  Vaping Use   Vaping status: Never Used  Substance and Sexual Activity   Alcohol use: Yes    Alcohol/week: 3.0 standard drinks of alcohol    Types: 3 Glasses  of wine per week    Comment: occasionally   Drug use: No   Sexual activity: Yes  Other Topics Concern   Not on file  Social History Narrative   ** Merged History Encounter **       Social Drivers of Corporate investment banker Strain: Not on file  Food Insecurity: Not on file  Transportation Needs: Not on file  Physical Activity: Not on file  Stress: Not on file  Social Connections: Not on file  Intimate Partner Violence: Not on file    Family History  Problem Relation Age of Onset   Diabetes Mother    Hyperlipidemia Mother    Diabetes Father    Hyperlipidemia Father    Esophageal cancer Father    Drug abuse Sister    Pancreatic cancer Sister    Hyperlipidemia Maternal Grandmother    Colon polyps Niece    Colon cancer Neg Hx    Prostate cancer Neg Hx    Rectal cancer Neg Hx    Stomach cancer Neg Hx      Review of Systems  Constitutional: Negative.  Negative for chills and fever.  HENT: Negative.  Negative for congestion and sore throat.   Respiratory: Negative.  Negative for cough and shortness of breath.   Cardiovascular:  Positive for palpitations. Negative for chest pain.  Gastrointestinal:  Negative for abdominal pain, diarrhea, nausea and vomiting.  Genitourinary: Negative.  Negative for dysuria and hematuria.  Skin: Negative.  Negative for rash.  Neurological: Negative.  Negative for dizziness and headaches.  All other systems reviewed and are negative.   Vitals:   05/26/23 1356  BP: 134/68  Pulse: 78  Temp: 98.6 F (37 C)  SpO2: 96%    Physical Exam Vitals reviewed.  Constitutional:      Appearance: Normal appearance.  HENT:     Head: Normocephalic.  Eyes:     Extraocular Movements: Extraocular movements intact.     Pupils: Pupils are equal, round, and reactive to light.  Cardiovascular:     Rate and Rhythm: Normal rate and regular rhythm.     Pulses: Normal pulses.     Heart sounds: Normal heart sounds.     Comments: Occasional  extrasystoles Pulmonary:     Effort: Pulmonary effort is normal.     Breath sounds: Normal breath sounds.  Musculoskeletal:     Cervical back: No tenderness.  Lymphadenopathy:     Cervical: No cervical adenopathy.  Skin:    General: Skin is warm and dry.  Neurological:     Mental Status: He is alert and oriented to person, place, and time.  Psychiatric:        Mood and Affect: Mood normal.        Behavior:  Behavior normal.    EKG: Normal sinus rhythm with ventricular rate of 67.  No acute ischemic changes.  ASSESSMENT & PLAN: A total of 46 minutes was spent with the patient and counseling/coordination of care regarding preparing for this visit, review of most recent office visit notes, review of multiple chronic medical conditions and their management, differential diagnosis of palpitations and need for long-term monitoring, review of all medications, review of most recent bloodwork results, review of health maintenance items, education on nutrition, prognosis, documentation, and need for follow up.   Problem List Items Addressed This Visit       Cardiovascular and Mediastinum   Paroxysmal atrial fibrillation (HCC)   Well-controlled.  Presently in sinus rhythm. On no medication      Relevant Medications   tadalafil  (CIALIS ) 5 MG tablet   Other Relevant Orders   LONG TERM MONITOR (3-14 DAYS)   Thyroid  Panel With TSH   CBC with Differential/Platelet   Comprehensive metabolic panel with GFR   Magnesium   Hemoglobin A1c   Lipid panel   HTN (hypertension)   Relevant Medications   tadalafil  (CIALIS ) 5 MG tablet   Other Relevant Orders   Thyroid  Panel With TSH   CBC with Differential/Platelet   Comprehensive metabolic panel with GFR   Magnesium   Hemoglobin A1c   Lipid panel     Respiratory   Obstructive sleep apnea on CPAP   Relevant Orders   Thyroid  Panel With TSH   CBC with Differential/Platelet   Comprehensive metabolic panel with GFR   Magnesium   Hemoglobin  A1c   Lipid panel     Other   Abnormal heart rate   Extrasystoles on physical exam Normal EKG Normal heart rate today History of atrial fibrillation Recommend Zio patch for 14 days      Relevant Orders   EKG 12-Lead   LONG TERM MONITOR (3-14 DAYS)   Thyroid  Panel With TSH   CBC with Differential/Platelet   Comprehensive metabolic panel with GFR   Magnesium   Hemoglobin A1c   Lipid panel   Palpitations - Primary   Patient has history of paroxysmal atrial fibrillation EKG today shows normal sinus rhythm with no significant abnormalities. Presently on no medications. Sometimes triggered by stress. Clinically stable.  No red flag signs or symptoms. No syncopal episodes but patient becomes aware of palpitations Recommend to start workup with Zio patch for 14 days May need cardiology evaluation in the near future ED precautions given Blood work including thyroid  panel done today We will follow-up in 3 months      Relevant Orders   LONG TERM MONITOR (3-14 DAYS)   Thyroid  Panel With TSH   CBC with Differential/Platelet   Comprehensive metabolic panel with GFR   Magnesium   Hemoglobin A1c   Lipid panel   Patient Instructions  Palpitaciones Palpitations Las palpitaciones son sensaciones de que su latido cardaco no es normal. El latido cardaco puede sentirse de las siguientes maneras: Desparejo (irregular). Ms rpido de lo normal. Como un aleteo. Que saltea un latido. Generalmente no es un problema grave. Sin embargo, un mdico le har pruebas y examinar sus antecedentes mdicos para asegurarse de que no tiene un problema cardaco grave. Siga estas instrucciones en su casa: Controle la afeccin para detectar cualquier cambio. Informe al mdico si hay algn cambio. Tome estas medidas para ayudar a Chief Operating Officer sus sntomas: Comida y bebida Siga las instrucciones del mdico sobre las cosas que puede comer y beber. Tal vez le  indiquen que evite estas cosas: Bebidas que  contengan cafena, como caf, t, refrescos y bebidas energizantes. Chocolate. Alcohol. Pastillas para adelgazar. Estilo de vida     Intente reducir [employer] de estrs. Estas cosas pueden ayudarlo a relajarse: Yoga. Respiracin profunda y meditacin. Imgenes guiadas. Esto es usar palabras e imgenes para crear pensamientos positivos. Ejercicio, como nadar, trotar y Advertising account planner. Informe a su mdico si tiene ms latidos cardacos anormales cuando est activo. Si siente dolor en el pecho o le falta el aire con el ejercicio, no siga haciendo ejercicio hasta que lo vea el mdico. Biorretroalimentacin. Esto consiste en usar la mente para controlar cosas del cuerpo, como el latido cardaco. Descanse y duerma lo suficiente. Mantenga un horario habitual para acostarse. No consuma drogas, como cocana o xtasis. No consuma marihuana. No fume ni consuma ningn producto que contenga nicotina o tabaco. Si necesita ayuda para dejar de fumar, consulte al mdico. Instrucciones generales Use los medicamentos de venta libre y los recetados solamente como se lo haya indicado el mdico. Oceanographer a todas las visitas de seguimiento. Es probable que necesite ms pruebas si las palpitaciones no desaparecen o empeoran. Comunquese con un mdico si: Sigue teniendo latidos cardacos rpidos o irregulares durante mucho tiempo. Los sntomas suceden con ms frecuencia. Solicite ayuda de inmediato si: Midwife. Le falta el aire. Tiene un dolor de cabeza muy intenso. Siente mareos. Se desmaya. Estos sntomas pueden Customer service manager. Solicite ayuda de inmediato. Comunquese con el servicio de emergencias de su localidad (911 en los [place]). No espere a ver si los sntomas desaparecen. No conduzca por sus propios medios [facility]. Resumen Las palpitaciones son sensaciones de que su latido cardaco es irregular o ms rpido de lo normal. Se siente como un aleteo o que falta un  latido. Evite los alimentos y las bebidas que puedan causarle esta afeccin. Entre ellos se incluyen la cafena, el chocolate y las bebidas alcohlicas. Intente reducir [employer] de estrs. No fume ni consuma drogas. Obtenga ayuda de inmediato si se desmaya, se siente mareado, tiene falta de aire, tiene dolor de pecho o tiene dolor de cabeza muy intenso. Esta informacin no tiene Theme park manager el consejo del mdico. Asegrese de hacerle al mdico cualquier pregunta que tenga. Document Revised: 07/08/2020 Document Reviewed: 07/08/2020 Elsevier Patient Education  2024 Elsevier Inc.    Maryagnes Small, MD [facility]

## 2023-05-26 NOTE — Progress Notes (Unsigned)
 EP to read.

## 2023-05-26 NOTE — Assessment & Plan Note (Signed)
 Extrasystoles on physical exam Normal EKG Normal heart rate today History of atrial fibrillation Recommend Zio patch for 14 days

## 2023-05-26 NOTE — Assessment & Plan Note (Signed)
Well-controlled.  Presently in sinus rhythm. On no medication.

## 2023-05-26 NOTE — Assessment & Plan Note (Signed)
 Patient has history of paroxysmal atrial fibrillation EKG today shows normal sinus rhythm with no significant abnormalities. Presently on no medications. Sometimes triggered by stress. Clinically stable.  No red flag signs or symptoms. No syncopal episodes but patient becomes aware of palpitations Recommend to start workup with Zio patch for 14 days May need cardiology evaluation in the near future ED precautions given Blood work including thyroid panel done today We will follow-up in 3 months

## 2023-05-26 NOTE — Patient Instructions (Signed)
Palpitaciones Palpitations Las palpitaciones son sensaciones de que su latido cardaco no es normal. El latido cardaco puede sentirse de las siguientes maneras: Desparejo (irregular). Ms rpido de lo normal. Como un aleteo. Que saltea un latido. Generalmente no es un problema grave. Sin embargo, un mdico le har pruebas y examinar sus antecedentes mdicos para asegurarse de que no tiene un problema cardaco grave. Siga estas instrucciones en su casa: Controle la afeccin para detectar cualquier cambio. Informe al mdico si hay algn cambio. Tome estas medidas para ayudar a Chief Operating Officer sus sntomas: Comida y bebida Siga las instrucciones del mdico sobre las cosas que puede comer y beber. Tal vez le indiquen que evite estas cosas: Bebidas que contengan [place], t, refrescos y bebidas energizantes. Chocolate. Alcohol. Pastillas para adelgazar. Estilo de vida     Intente reducir [employer] de estrs. Estas cosas pueden ayudarlo a relajarse: Yoga. Respiracin profunda y meditacin. Imgenes guiadas. Esto es usar palabras e imgenes para crear pensamientos positivos. Ejercicio, como nadar, trotar y Advertising account planner. Informe a su mdico si tiene ms latidos cardacos anormales cuando est activo. Si siente dolor en el pecho o le falta el aire con el ejercicio, no siga haciendo ejercicio hasta que lo vea el mdico. Biorretroalimentacin. Esto consiste en usar la mente para controlar cosas del cuerpo, como el latido cardaco. Descanse y duerma lo suficiente. Mantenga un horario habitual para acostarse. No consuma drogas, como cocana o xtasis. No consuma marihuana. No fume ni consuma ningn producto que contenga nicotina o tabaco. Si necesita ayuda para dejar de fumar, consulte al mdico. Instrucciones generales Use los medicamentos de venta libre y los recetados solamente como se lo haya indicado el mdico. Oceanographer a todas las visitas de seguimiento. Es probable que necesite ms pruebas  si las palpitaciones no desaparecen o empeoran. Comunquese con un mdico si: Sigue teniendo latidos cardacos rpidos o Comptroller. Los sntomas suceden con ms frecuencia. Solicite ayuda de inmediato si: Midwife. Le falta el aire. Tiene un dolor de cabeza muy intenso. Siente mareos. Se desmaya. Estos sntomas pueden Customer service manager. Solicite ayuda de inmediato. Comunquese con el servicio de emergencias de su localidad (911 en [place]). No espere a ver si los sntomas desaparecen. No conduzca por sus propios medios [facility]. Resumen Las palpitaciones son sensaciones de que su latido cardaco es irregular o ms rpido de lo normal. Se siente como un aleteo o que falta un latido. Evite los alimentos y las bebidas que puedan causarle esta afeccin. Entre ellos se incluyen la cafena, el chocolate y las bebidas alcohlicas. Intente reducir [employer] de estrs. No fume ni consuma drogas. Obtenga ayuda de inmediato si se desmaya, se siente mareado, tiene falta de aire, tiene dolor de pecho o tiene dolor de cabeza muy intenso. Esta informacin no tiene Theme park manager el consejo del mdico. Asegrese de hacerle al mdico cualquier pregunta que tenga. Document Revised: 07/08/2020 Document Reviewed: 07/08/2020 Elsevier Patient Education  2024 [employer].

## 2023-05-26 NOTE — Telephone Encounter (Addendum)
 Good afternoon, I am currently working on a request for a PA for cialis  5mg  and wanted to confirm is being used for palpitations or ED?

## 2023-05-27 ENCOUNTER — Other Ambulatory Visit (HOSPITAL_COMMUNITY): Payer: Self-pay

## 2023-05-27 ENCOUNTER — Telehealth: Payer: Self-pay

## 2023-05-27 ENCOUNTER — Encounter: Payer: Self-pay | Admitting: Radiology

## 2023-05-27 LAB — THYROID PANEL WITH TSH
Free Thyroxine Index: 1.8 (ref 1.4–3.8)
T3 Uptake: 29 % (ref 22–35)
T4, Total: 6.2 ug/dL (ref 4.9–10.5)
TSH: 1.73 m[IU]/L (ref 0.40–4.50)

## 2023-05-27 NOTE — Telephone Encounter (Signed)
 Pharmacy Patient Advocate Encounter   Received notification from CoverMyMeds that prior authorization for Tadalafil  5MG  tablets is required/requested.   Insurance verification completed.   The patient is insured through [facility] .   Per test claim: PA required; PA submitted to above mentioned insurance via CoverMyMeds Key/confirmation #/EOC [facility] Status is pending

## 2023-05-27 NOTE — Telephone Encounter (Signed)
 Pharmacy Patient Advocate Encounter  Received notification from [facility] that Prior Authorization for TADALAFIL  has been DENIED.  See denial reason below. No denial letter attached in CMM. Will attach denial letter to Media tab once received.   PA #/Case ID/Reference #: [id number]

## 2023-06-09 ENCOUNTER — Emergency Department (HOSPITAL_COMMUNITY)
Admission: EM | Admit: 2023-06-09 | Discharge: 2023-06-09 | Disposition: A | Discharge status: Home / Self Care | Attending: Student | Admitting: Student

## 2023-06-09 ENCOUNTER — Other Ambulatory Visit: Payer: Self-pay

## 2023-06-09 ENCOUNTER — Encounter (HOSPITAL_COMMUNITY): Payer: Self-pay

## 2023-06-09 ENCOUNTER — Emergency Department (HOSPITAL_COMMUNITY)

## 2023-06-09 DIAGNOSIS — Z79899 Other long term (current) drug therapy: Secondary | ICD-10-CM | POA: Insufficient documentation

## 2023-06-09 DIAGNOSIS — R079 Chest pain, unspecified: Secondary | ICD-10-CM | POA: Insufficient documentation

## 2023-06-09 DIAGNOSIS — R0602 Shortness of breath: Secondary | ICD-10-CM | POA: Diagnosis not present

## 2023-06-09 DIAGNOSIS — I10 Essential (primary) hypertension: Secondary | ICD-10-CM | POA: Diagnosis not present

## 2023-06-09 LAB — CBC
HCT: 45.8 % (ref 39.0–52.0)
Hemoglobin: 15.2 g/dL (ref 13.0–17.0)
MCH: 34.1 pg — ABNORMAL HIGH (ref 26.0–34.0)
MCHC: 33.2 g/dL (ref 30.0–36.0)
MCV: 102.7 fL — ABNORMAL HIGH (ref 80.0–100.0)
Platelets: 274 10*3/uL (ref 150–400)
RBC: 4.46 MIL/uL (ref 4.22–5.81)
RDW: 13.4 % (ref 11.5–15.5)
WBC: 7.6 10*3/uL (ref 4.0–10.5)
nRBC: 0 % (ref 0.0–0.2)

## 2023-06-09 LAB — BASIC METABOLIC PANEL WITH GFR
Anion gap: 11 (ref 5–15)
BUN: 16 mg/dL (ref 8–23)
CO2: 24 mmol/L (ref 22–32)
Calcium: 9.3 mg/dL (ref 8.9–10.3)
Chloride: 105 mmol/L (ref 98–111)
Creatinine, Ser: 1.36 mg/dL — ABNORMAL HIGH (ref 0.61–1.24)
GFR, Estimated: 58 mL/min — ABNORMAL LOW (ref >60)
Glucose, Bld: 108 mg/dL — ABNORMAL HIGH (ref 70–99)
Potassium: 4.1 mmol/L (ref 3.5–5.1)
Sodium: 140 mmol/L (ref 135–145)

## 2023-06-09 LAB — TROPONIN I (HIGH SENSITIVITY)
Troponin I (High Sensitivity): 12 ng/L (ref <18)
Troponin I (High Sensitivity): 14 ng/L (ref <18)

## 2023-06-09 NOTE — ED Triage Notes (Signed)
 Spanish interpreter used for triage.   Pain has been intermittent for 2-3 weeks. PCP put a cardiac monitor on him.   Pt sts pain intensified today and radiates down left arm.   Compliant on Eliquis  for hx of afib.

## 2023-06-09 NOTE — ED Notes (Signed)
 Patient transported to X-ray

## 2023-06-09 NOTE — ED Provider Notes (Signed)
 [place] [facility] Provider Note   CSN: [id number] Arrival date & time: 06/09/23  4098     History  Chief Complaint  Patient presents with   Chest Pain   HPI Evan Page is a 63 y.o. male with history of paroxysmal A-fib, hypertension, OSA presenting for chest pain.  States has been going on for 2 to 3 weeks. States he was sweeping at work when the chest pain started again this morning about an hour ago. It is left-sided and radiates to the left arm.  Endorses some shortness of breath as well.  Feels like a dull pain.  Also endorses heart fluttering sensation as well.  He states it lasted for about 45 minutes. At this time he is chest pain-free and not short of breath.  He states that he has not been on Eliquis  for the past 4 to 5 years. States that his cardiologist advised him to discontinue.  Was recently placed on a Holter monitor by his PCP for his intermittent palpitations.   Chest Pain      Home Medications Prior to Admission medications   Medication Sig Start Date End Date Taking? Authorizing Provider  allopurinol  (ZYLOPRIM ) 100 MG tablet Take 1 tablet (100 mg total) by mouth daily. 09/12/22  Yes Sagardia, Isidro Margo, MD  furosemide  (LASIX ) 20 MG tablet TAKE 1 TABLET BY MOUTH EVERY DAY AS NEEDED Patient taking differently: Take 20 mg by mouth daily. 04/17/23  Yes Sagardia, Isidro Margo, MD  losartan  (COZAAR ) 25 MG tablet TAKE 1 TABLET (25 MG TOTAL) BY MOUTH DAILY. 05/04/23  Yes Sagardia, Isidro Margo, MD  tadalafil  (CIALIS ) 5 MG tablet Take 1 tablet (5 mg total) by mouth daily. 05/26/23  Yes Elvira Hammersmith, MD      Allergies    Patient has no known allergies.    Review of Systems   Review of Systems  Cardiovascular:  Positive for chest pain.    Physical Exam   Vitals:   06/09/23 0830 06/09/23 0845  BP: 121/65 115/66  Pulse: (!) 57 62  Resp: 19 (!) 21  Temp:    SpO2: 97% 99%    CONSTITUTIONAL:   well-appearing, NAD NEURO:  Alert and oriented x 3, CN 3-12 grossly intact EYES:  eyes equal and reactive ENT/NECK:  Supple, no stridor  CARDIO:  regular rate and rhythm, appears well-perfused, radial pulses and pedal pulses are 2+ PULM:  No respiratory distress, CTAB GI/GU:  non-distended, soft MSK/SPINE:  No gross deformities, no edema, moves all extremities  SKIN:  no rash, atraumatic   *Additional and/or pertinent findings included in MDM below    ED Results / Procedures / Treatments   Labs (all labs ordered are listed, but only abnormal results are displayed) Labs Reviewed  BASIC METABOLIC PANEL WITH GFR - Abnormal; Notable for the following components:      Result Value   Glucose, Bld 108 (*)    Creatinine, Ser 1.36 (*)    GFR, Estimated 58 (*)    All other components within normal limits  CBC - Abnormal; Notable for the following components:   MCV 102.7 (*)    MCH 34.1 (*)    All other components within normal limits  TROPONIN I (HIGH SENSITIVITY)  TROPONIN I (HIGH SENSITIVITY)    EKG None  Radiology DG Chest 2 View Result Date: 06/09/2023 CLINICAL DATA:  Worsening chest pain with radiation to left arm. Atrial fibrillation. EXAM: CHEST - 2 VIEW COMPARISON:  04/25/2023 FINDINGS: Heart size is within normal limits. Decreased lung volumes since previous study. Pulmonary interstitial prominence remains stable allowing for decreased lung volumes, and there is no evidence of acute infiltrate or pleural effusion. IMPRESSION: Low inspiratory lung volumes.  No acute findings. Electronically Signed   By: Marlyce Sine M.D.   On: 06/09/2023 07:21    Procedures Procedures    Medications Ordered in ED Medications - No data to display  ED Course/ Medical Decision Making/ A&P Clinical Course as of 06/09/23 1105  Thu Jun 09, 2023  0745 BMI (Calculated): 36.48 [JR]    Clinical Course User Index [JR] Janalee Mcmurray, PA-C             HEART Score: 3                     Medical Decision Making  Initial Impression and Ddx 63 year old well-appearing male presenting for chest pain.  Exam is unremarkable.  DDx includes ACS, PE, aortic dissection, pneumothorax, arrhythmia, CHF exacerbation, other. Patient PMH that increases complexity of ED encounter:   history of paroxysmal A-fib, hypertension, OSA  Interpretation of Diagnostics - I independent reviewed and interpreted the labs as followed: No acute derangements, troponins within normal limits  - I independently visualized the following imaging with scope of interpretation limited to determining acute life threatening conditions related to emergency care: cxr, which revealed no acute findings  - I personally reviewed and interpreted EKG which revealed normal sinus rhythm with PAC but appears non-ischemic  Patient Reassessment and Ultimate Disposition/Management On reassessment, patient remained chest pain-free.  Workup overall was reassuring.  Story is somewhat concerning given his risk factors and age for this reason feel that he needs close follow-up with cardiology.  Submitted ambulatory referral.  Did discuss strict return precautions.  But feel he is safe for discharge.  Advised also that he follow-up with his PCP as well.    Discharged in good condition.  Patient management required discussion with the following services or consulting groups:  None  Complexity of Problems Addressed Acute complicated illness or Injury  Additional Data Reviewed and Analyzed Further history obtained from: Past medical history and medications listed in the EMR and Prior ED visit notes  Patient Encounter Risk Assessment None         Final Clinical Impression(s) / ED Diagnoses Final diagnoses:  Chest pain, unspecified type    Rx / DC Orders ED Discharge Orders          Ordered    Ambulatory referral to Cardiology       Comments: If you have not heard from the Cardiology office within the next 72 hours  please call [phone].   06/09/23 1105              Janalee Mcmurray, PA-C 06/09/23 1110    Kommor, [place], MD 06/09/23 1723

## 2023-06-09 NOTE — Discharge Instructions (Addendum)
 Evaluation for your chest pain was overall reassuring.  I do feel that you need to follow-up with cardiology.  I submitted an ambulatory referral.  If your chest pain returns you have shortness of breath, palpitations or any other concerning symptom please return to the ED for further evaluation.

## 2023-06-20 ENCOUNTER — Ambulatory Visit: Payer: Self-pay | Admitting: Dermatology

## 2023-06-20 ENCOUNTER — Encounter: Payer: Self-pay | Admitting: Dermatology

## 2023-06-20 VITALS — BP 127/76 | HR 88

## 2023-06-20 DIAGNOSIS — L820 Inflamed seborrheic keratosis: Secondary | ICD-10-CM

## 2023-06-20 DIAGNOSIS — L918 Other hypertrophic disorders of the skin: Secondary | ICD-10-CM | POA: Diagnosis not present

## 2023-06-20 DIAGNOSIS — W908XXA Exposure to other nonionizing radiation, initial encounter: Secondary | ICD-10-CM

## 2023-06-20 DIAGNOSIS — L570 Actinic keratosis: Secondary | ICD-10-CM | POA: Diagnosis not present

## 2023-06-20 NOTE — Patient Instructions (Addendum)

## 2023-06-20 NOTE — Progress Notes (Signed)
 New Patient Visit   Subjective  Evan Page is a 63 y.o. male who presents for the following: growths Pt has various growths around his neck, 1 on his scalp and 1 on his thigh. He has no hx of skin cancer nor family hx. Spanish interpreter was present for the visit.   The following portions of the chart were reviewed this encounter and updated as appropriate: medications, allergies, medical history  Review of Systems:  No other skin or systemic complaints except as noted in HPI or Assessment and Plan.  Objective  Well appearing patient in no apparent distress; mood and affect are within normal limits.  A focused examination was performed of the following areas: Neck, scalp, left thigh  Relevant exam findings are noted in the Assessment and Plan.  Right Breast Inflamed stuck on papule Mid Parietal Scalp Erythematous thin papules/macules with gritty scale.   Assessment & Plan   ACTINIC KERATOSIS Exam: Erythematous thin papules/macules with gritty scale  Actinic keratoses are precancerous spots that appear secondary to cumulative UV radiation exposure/sun exposure over time. They are chronic with expected duration over 1 year. A portion of actinic keratoses will progress to squamous cell carcinoma of the skin. It is not possible to reliably predict which spots will progress to skin cancer and so treatment is recommended to prevent development of skin cancer.  Recommend daily broad spectrum sunscreen SPF 30+ to sun-exposed areas, reapply every 2 hours as needed.  Recommend staying in the shade or wearing long sleeves, sun glasses (UVA+UVB protection) and wide brim hats (4-inch brim around the entire circumference of the hat). Call for new or changing lesions.  Treatment Plan:  Prior to procedure, discussed risks of blister formation, small wound, skin dyspigmentation, or rare scar following cryotherapy. Recommend Vaseline ointment to treated areas while  healing.  Destruction Procedure Note Destruction method: cryotherapy   Informed consent: discussed and consent obtained   Lesion destroyed using liquid nitrogen: Yes   Outcome: patient tolerated procedure well with no complications   Post-procedure details: wound care instructions given   Locations: left vertex scalp # of Lesions Treated: 1  INFLAMED SEBORRHEIC KERATOSIS Exam: Erythematous keratotic or waxy stuck-on papule or plaque.  Symptomatic, irritating, patient would like treated.  Benign-appearing.  Call clinic for new or changing lesions.   Prior to procedure, discussed risks of blister formation, small wound, skin dyspigmentation, or rare scar following treatment. Recommend Vaseline ointment to treated areas while healing.  Destruction Procedure Note Destruction method: cryotherapy   Informed consent: discussed and consent obtained   Lesion destroyed using liquid nitrogen: Yes   Outcome: patient tolerated procedure well with no complications   Post-procedure details: wound care instructions given   Locations: right chest # of Lesions Treated: 1  INFLAMED SEBORRHEIC KERATOSIS Right Breast Destruction of lesion - Right Breast Complexity: simple   Destruction method: cryotherapy   Informed consent: discussed and consent obtained   Timeout:  patient name, date of birth, surgical site, and procedure verified Procedure prep:  Patient was prepped and draped in usual sterile fashion Outcome: patient tolerated procedure well with no complications   Post-procedure details: wound care instructions given   AK (ACTINIC KERATOSIS) Mid Parietal Scalp Destruction of lesion - Mid Parietal Scalp Complexity: simple   Destruction method: cryotherapy   Informed consent: discussed and consent obtained   Timeout:  patient name, date of birth, surgical site, and procedure verified Outcome: patient tolerated procedure well with no complications   Post-procedure details: wound  care  instructions given     Acrochordons (Skin Tags) - Fleshy, skin-colored pedunculated papules - Benign appearing.  - Observe. - If desired, they can be removed with an in office procedure that is not covered by insurance. - Please call the clinic if you notice any new or changing lesions.  Return in about 2 months (around 08/20/2023) for AK f/u.  I, Wilson Hasten, CMA, am acting as scribe for Deneise Finlay, MD.   Documentation: I have reviewed the above documentation for accuracy and completeness, and I agree with the above.  Deneise Finlay, MD

## 2023-07-02 DIAGNOSIS — R002 Palpitations: Secondary | ICD-10-CM

## 2023-07-02 DIAGNOSIS — R009 Unspecified abnormalities of heart beat: Secondary | ICD-10-CM

## 2023-07-02 DIAGNOSIS — I480 Paroxysmal atrial fibrillation: Secondary | ICD-10-CM

## 2023-07-18 ENCOUNTER — Other Ambulatory Visit: Payer: Self-pay | Admitting: Emergency Medicine

## 2023-08-22 ENCOUNTER — Ambulatory Visit: Admitting: Dermatology

## 2023-08-22 ENCOUNTER — Encounter: Payer: Self-pay | Admitting: Dermatology

## 2023-08-22 VITALS — BP 121/63 | HR 79

## 2023-08-22 DIAGNOSIS — L570 Actinic keratosis: Secondary | ICD-10-CM

## 2023-08-22 DIAGNOSIS — W908XXA Exposure to other nonionizing radiation, initial encounter: Secondary | ICD-10-CM | POA: Diagnosis not present

## 2023-08-22 NOTE — Patient Instructions (Addendum)

## 2023-08-22 NOTE — Progress Notes (Signed)
   Follow-Up Visit   Subjective  Evan Page is a 63 y.o. male who presents for the following: AK Pt here to follow up from an AK of the mid parietal scalp that was treated on 06/20/23. Lesion has resolved. He is here with a [place] interpreter.   The following portions of the chart were reviewed this encounter and updated as appropriate: medications, allergies, medical history  Review of Systems:  No other skin or systemic complaints except as noted in HPI or Assessment and Plan.  Objective  Well appearing patient in no apparent distress; mood and affect are within normal limits.  A focused examination was performed of the following areas: Mid parietal scalp  Relevant exam findings are noted in the Assessment and Plan.  Left Upper Arm - Anterior Erythematous thin papules/macules with gritty scale.   Assessment & Plan   ACTINIC KERATOSIS Exam: Erythematous thin papules/macules with gritty scale on left upper arm; lesion on scalp resolved s/p cryotherapy  Actinic keratoses are precancerous spots that appear secondary to cumulative UV radiation exposure/sun exposure over time. They are chronic with expected duration over 1 year. A portion of actinic keratoses will progress to squamous cell carcinoma of the skin. It is not possible to reliably predict which spots will progress to skin cancer and so treatment is recommended to prevent development of skin cancer.  Recommend daily broad spectrum sunscreen SPF 30+ to sun-exposed areas, reapply every 2 hours as needed.  Recommend staying in the shade or wearing long sleeves, sun glasses (UVA+UVB protection) and wide brim hats (4-inch brim around the entire circumference of the hat). Call for new or changing lesions.  Treatment Plan:  Prior to procedure, discussed risks of blister formation, small wound, skin dyspigmentation, or rare scar following cryotherapy. Recommend Vaseline ointment to treated areas while  healing.  Destruction Procedure Note Destruction method: cryotherapy   Informed consent: discussed and consent obtained   Lesion destroyed using liquid nitrogen: Yes   Outcome: patient tolerated procedure well with no complications   Post-procedure details: wound care instructions given   Locations: left arm # of Lesions Treated: 1   AK (ACTINIC KERATOSIS) Left Upper Arm - Anterior Destruction of lesion - Left Upper Arm - Anterior Complexity: simple   Destruction method: cryotherapy   Informed consent: discussed and consent obtained   Timeout:  patient name, date of birth, surgical site, and procedure verified Outcome: patient tolerated procedure well with no complications   Post-procedure details: wound care instructions given     Return in about 2 months (around 10/23/2023) for AK f/u.  I, Darice Smock, CMA, am acting as scribe for RUFUS CHRISTELLA HOLY, MD.   Documentation: I have reviewed the above documentation for accuracy and completeness, and I agree with the above.  RUFUS CHRISTELLA HOLY, MD

## 2023-08-30 ENCOUNTER — Ambulatory Visit: Admitting: Emergency Medicine

## 2023-09-13 ENCOUNTER — Encounter: Admitting: Emergency Medicine

## 2023-09-28 ENCOUNTER — Ambulatory Visit: Admitting: Emergency Medicine

## 2023-09-28 ENCOUNTER — Encounter: Payer: Self-pay | Admitting: Emergency Medicine

## 2023-09-28 VITALS — BP 126/58 | HR 73 | Temp 98.3°F | Ht 67.0 in | Wt 242.0 lb

## 2023-09-28 DIAGNOSIS — Z125 Encounter for screening for malignant neoplasm of prostate: Secondary | ICD-10-CM

## 2023-09-28 DIAGNOSIS — Z131 Encounter for screening for diabetes mellitus: Secondary | ICD-10-CM

## 2023-09-28 DIAGNOSIS — R002 Palpitations: Secondary | ICD-10-CM | POA: Diagnosis not present

## 2023-09-28 DIAGNOSIS — Z1329 Encounter for screening for other suspected endocrine disorder: Secondary | ICD-10-CM | POA: Diagnosis not present

## 2023-09-28 DIAGNOSIS — M1A9XX1 Chronic gout, unspecified, with tophus (tophi): Secondary | ICD-10-CM

## 2023-09-28 DIAGNOSIS — Z0001 Encounter for general adult medical examination with abnormal findings: Secondary | ICD-10-CM | POA: Diagnosis not present

## 2023-09-28 DIAGNOSIS — Z13220 Encounter for screening for lipoid disorders: Secondary | ICD-10-CM | POA: Diagnosis not present

## 2023-09-28 DIAGNOSIS — I480 Paroxysmal atrial fibrillation: Secondary | ICD-10-CM | POA: Diagnosis not present

## 2023-09-28 DIAGNOSIS — Z13228 Encounter for screening for other metabolic disorders: Secondary | ICD-10-CM | POA: Diagnosis not present

## 2023-09-28 DIAGNOSIS — G4733 Obstructive sleep apnea (adult) (pediatric): Secondary | ICD-10-CM | POA: Diagnosis not present

## 2023-09-28 DIAGNOSIS — Z130 Encounter for screening for diseases of the blood and blood-forming organs and certain disorders involving the immune mechanism: Secondary | ICD-10-CM

## 2023-09-28 DIAGNOSIS — I10 Essential (primary) hypertension: Secondary | ICD-10-CM

## 2023-09-28 LAB — CBC WITH DIFFERENTIAL/PLATELET
Basophils Absolute: 0 K/uL (ref 0.0–0.1)
Basophils Relative: 0.5 % (ref 0.0–3.0)
Eosinophils Absolute: 0.1 K/uL (ref 0.0–0.7)
Eosinophils Relative: 0.7 % (ref 0.0–5.0)
HCT: 41.7 % (ref 39.0–52.0)
Hemoglobin: 13.9 g/dL (ref 13.0–17.0)
Lymphocytes Relative: 30.4 % (ref 12.0–46.0)
Lymphs Abs: 2.5 K/uL (ref 0.7–4.0)
MCHC: 33.3 g/dL (ref 30.0–36.0)
MCV: 101.7 fl — ABNORMAL HIGH (ref 78.0–100.0)
Monocytes Absolute: 0.6 K/uL (ref 0.1–1.0)
Monocytes Relative: 7.2 % (ref 3.0–12.0)
Neutro Abs: 5 K/uL (ref 1.4–7.7)
Neutrophils Relative %: 61.2 % (ref 43.0–77.0)
Platelets: 257 K/uL (ref 150.0–400.0)
RBC: 4.1 Mil/uL — ABNORMAL LOW (ref 4.22–5.81)
RDW: 13.5 % (ref 11.5–15.5)
WBC: 8.3 K/uL (ref 4.0–10.5)

## 2023-09-28 LAB — COMPREHENSIVE METABOLIC PANEL WITH GFR
ALT: 42 U/L (ref 0–53)
AST: 32 U/L (ref 0–37)
Albumin: 4 g/dL (ref 3.5–5.2)
Alkaline Phosphatase: 47 U/L (ref 39–117)
BUN: 20 mg/dL (ref 6–23)
CO2: 24 meq/L (ref 19–32)
Calcium: 8.8 mg/dL (ref 8.4–10.5)
Chloride: 108 meq/L (ref 96–112)
Creatinine, Ser: 1.57 mg/dL — ABNORMAL HIGH (ref 0.40–1.50)
GFR: 46.4 mL/min — ABNORMAL LOW (ref >60.00)
Glucose, Bld: 103 mg/dL — ABNORMAL HIGH (ref 70–99)
Potassium: 4.2 meq/L (ref 3.5–5.1)
Sodium: 141 meq/L (ref 135–145)
Total Bilirubin: 0.4 mg/dL (ref 0.2–1.2)
Total Protein: 7.2 g/dL (ref 6.0–8.3)

## 2023-09-28 LAB — URIC ACID: Uric Acid, Serum: 7.9 mg/dL — ABNORMAL HIGH (ref 4.0–7.8)

## 2023-09-28 LAB — LIPID PANEL
Cholesterol: 135 mg/dL (ref 0–200)
HDL: 38.2 mg/dL — ABNORMAL LOW (ref >39.00)
LDL Cholesterol: 66 mg/dL (ref 0–99)
NonHDL: 96.65
Total CHOL/HDL Ratio: 4
Triglycerides: 152 mg/dL — ABNORMAL HIGH (ref 0.0–149.0)
VLDL: 30.4 mg/dL (ref 0.0–40.0)

## 2023-09-28 LAB — VITAMIN D 25 HYDROXY (VIT D DEFICIENCY, FRACTURES): VITD: 17.66 ng/mL — ABNORMAL LOW (ref 30.00–100.00)

## 2023-09-28 LAB — HEMOGLOBIN A1C: Hgb A1c MFr Bld: 6.8 % — ABNORMAL HIGH (ref 4.6–6.5)

## 2023-09-28 LAB — VITAMIN B12: Vitamin B-12: 291 pg/mL (ref 211–911)

## 2023-09-28 LAB — PSA: PSA: 0.34 ng/mL (ref 0.10–4.00)

## 2023-09-28 NOTE — Patient Instructions (Signed)
 Mantenimiento de Research officer, political party, Male Adoptar un estilo de vida saludable y recibir atencin preventiva son importantes para promover la salud y Counsellor. Consulte al mdico sobre: El esquema adecuado para hacerse pruebas y exmenes peridicos. Cosas que puede hacer por su cuenta para prevenir enfermedades y [place] sano. Qu debo saber sobre la dieta, el peso y el ejercicio? Consuma una dieta saludable  Consuma una dieta que incluya muchas verduras, frutas, productos lcteos con bajo contenido de [place] y Associate Professor. No consuma muchos alimentos ricos en grasas slidas, azcares agregados o sodio. Mantenga un peso saludable El ndice de masa muscular [facility]) es una medida que puede utilizarse para identificar posibles problemas de [place]. Proporciona una estimacin de la grasa corporal basndose en el peso y la altura. Su mdico puede ayudarle a Engineer, site IMC y a Personnel officer o Pharmacologist un peso saludable. Haga ejercicio con regularidad Haga ejercicio con regularidad. Esta es una de las prcticas ms importantes que puede hacer por su salud. La [employer] de los adultos deben seguir estas pautas: Education officer, environmental, al menos, 150 minutos de actividad fsica por semana. El ejercicio debe aumentar la frecuencia cardaca y Media planner transpirar (ejercicio de intensidad moderada). Hacer ejercicios de fortalecimiento por lo [employer] por semana. Agregue esto a su plan de ejercicio de intensidad moderada. Pase menos tiempo sentado. Incluso la actividad fsica ligera puede ser beneficiosa. Controle sus niveles de colesterol y lpidos en la sangre Comience a realizarse anlisis de lpidos y Oncologist en la sangre a los 20 aos y luego reptalos cada 5 aos. Es posible que Insurance underwriter los niveles de colesterol con mayor frecuencia si: Sus niveles de lpidos y colesterol son altos. Es mayor de 40 aos. Presenta un alto riesgo de padecer enfermedades cardacas. Qu debo  saber sobre las pruebas de deteccin del cncer? Muchos tipos de cncer pueden detectarse de manera temprana y, a menudo, pueden prevenirse. Segn su historia clnica y sus antecedentes familiares, es posible que deba realizarse pruebas de deteccin del cncer en diferentes edades. Esto puede incluir pruebas de deteccin de lo siguiente: Building services engineer. Cncer de prstata. Cncer de piel. Cncer de pulmn. Qu debo saber sobre la enfermedad cardaca, la diabetes y la hipertensin arterial? Presin arterial y enfermedad cardaca La hipertensin arterial causa enfermedades cardacas y [place] el riesgo de accidente cerebrovascular. Es ms probable que esto se manifieste en las personas que tienen lecturas de presin arterial alta o tienen sobrepeso. Hable con el mdico sobre sus valores de presin arterial deseados. Hgase controlar la presin arterial: Cada 3 a 5 aos si tiene entre 18 y 71 aos. Todos los aos si es mayor de 40 aos. Si tiene entre 65 y 26 aos y es fumador o Insurance underwriter, pregntele al mdico si debe realizarse una prueba de deteccin de aneurisma artico abdominal (AAA) por nica vez. Diabetes Realcese exmenes de deteccin de la diabetes con regularidad. Este anlisis revisa el nivel de azcar en la sangre en [place]. Hgase las pruebas de deteccin: Cada tres aos despus de los 45 aos de edad si tiene un peso normal y un bajo riesgo de padecer diabetes. Con ms frecuencia y a partir de [place] edad inferior si tiene sobrepeso o un alto riesgo de padecer diabetes. Qu debo saber sobre la prevencin de infecciones? Hepatitis B Si tiene un riesgo ms alto de contraer hepatitis B, debe someterse a un examen de deteccin de este virus. Hable con el mdico para averiguar si tiene riesgo de  contraer la infeccin por hepatitis B. Hepatitis C Se recomienda un anlisis de Benjamin Perez para: Todos los que nacieron entre 1945 y [phone]. Todas las personas que tengan un riesgo de haber  contrado hepatitis C. Enfermedades de transmisin sexual (ETS) Debe realizarse pruebas de deteccin de ITS todos los aos, incluidas la gonorrea y la clamidia, si: Es sexualmente activo y es menor de [address]. Es mayor de [address], y Public affairs consultant informa que corre riesgo de tener este tipo de infecciones. La actividad sexual ha cambiado desde que le hicieron la ltima prueba de deteccin y tiene un riesgo mayor de Warehouse manager clamidia o Copy. Pregntele al mdico si usted tiene riesgo. Pregntele al mdico si usted tiene un alto riesgo de Primary school teacher VIH. El mdico tambin puede recomendarle un medicamento recetado para ayudar a evitar la infeccin por el VIH. Si elige tomar medicamentos para prevenir el VIH, primero debe [employer] de deteccin del VIH. Luego debe hacerse anlisis cada 3 meses mientras est tomando los medicamentos. Siga estas indicaciones en su casa: Consumo de alcohol No beba alcohol si el mdico se lo prohbe. Si bebe alcohol: Limite la cantidad que consume de 0 a 2 bebidas por da. Sepa cunta cantidad de alcohol hay en las bebidas que toma. En los [address], una medida equivale a una botella de cerveza de 12 oz (355 ml), un vaso de vino de 5 oz (148 ml) o un vaso de una bebida alcohlica de alta graduacin de 1 oz (44 ml). Estilo de vida No consuma ningn producto que contenga nicotina o tabaco. Estos productos incluyen cigarrillos, tabaco para Theatre manager y aparatos de vapeo, como los Administrator, Civil Service. Si necesita ayuda para dejar de consumir estos productos, consulte al mdico. No consuma drogas. No comparta agujas. Solicite ayuda a su mdico si necesita apoyo o informacin para abandonar las drogas. Indicaciones generales Realcese los estudios de rutina de [address], dentales y de Wellsite geologist. Mantngase al da con las vacunas. Infrmele a su mdico si: Se siente deprimido con frecuencia. Alguna vez ha sido vctima de [place] o no se siente seguro en su  casa. Resumen Adoptar un estilo de vida saludable y recibir atencin preventiva son importantes para promover la salud y Counsellor. Siga las instrucciones del mdico acerca de una dieta saludable, el ejercicio y la realizacin de pruebas o exmenes para Hotel manager. Siga las instrucciones del mdico con respecto al control del colesterol y la presin arterial. Esta informacin no tiene Theme park manager el consejo del mdico. Asegrese de hacerle al mdico cualquier pregunta que tenga. Document Revised: 06/25/2020 Document Reviewed: 06/25/2020 Elsevier Patient Education  2024 [employer].

## 2023-09-28 NOTE — Progress Notes (Signed)
 [facility] Evan Page 63 y.o.   Chief Complaint  Patient presents with   Annual Exam    Patient here for physical. No other concerns      HISTORY OF PRESENT ILLNESS: This is a 63 y.o. male here for annual exam and follow-up on chronic medical conditions. History of hypertension History of recurrent palpitations.  Went to emergency department 06/09/2023 complaining of palpitations and chest pain Has cardiology appointment for next week Recent Zio patch showed the following:  HR 46 to 156, average 72. 120 nonsustained SVT episodes, longest 10.6 seconds.  Rhythm strip suggests atrial tachycardia. 1 nonsustained VT episode lasting 15 beats. Occasional supraventricular ectopy, 1.7% Rare ventricular ectopy. No sustained arrhythmias. No atrial fibrillation. Symptom triggered episodes corresponded to sinus rhythm with supraventricular and ventricular ectopy.   Ole T. Cindie, MD, [facility], [facility] Cardiac Electrophysiology   HPI   Prior to Admission medications   Medication Sig Start Date End Date Taking? Authorizing Provider  allopurinol  (ZYLOPRIM ) 100 MG tablet Take 1 tablet (100 mg total) by mouth daily. 09/12/22  Yes Keyandra Swenson, Emil Schanz, MD  furosemide  (LASIX ) 20 MG tablet Take 1 tablet (20 mg total) by mouth daily. 07/18/23  Yes Imunique Samad, Emil Schanz, MD  losartan  (COZAAR ) 25 MG tablet TAKE 1 TABLET (25 MG TOTAL) BY MOUTH DAILY. 05/04/23  Yes Monia Timmers, Emil Schanz, MD  tadalafil  (CIALIS ) 5 MG tablet Take 1 tablet (5 mg total) by mouth daily. Patient not taking: Reported on 09/28/2023 05/26/23   Purcell Emil Schanz, MD    No Known Allergies  Patient Active Problem List   Diagnosis Date Noted   Abnormal heart rate 05/26/2023   Palpitations 05/26/2023   Class 2 severe obesity due to excess calories with serious comorbidity and body mass index (BMI) of 36.0 to 36.9 in adult [facility]) 09/09/2022   Change in multiple pigmented skin lesions 09/09/2022   HTN (hypertension) 11/23/2021    Chronic gout with tophus 03/24/2021   Obesity (BMI 30.0-34.9) 03/27/2018   Obstructive sleep apnea on CPAP 11/01/2017   Erectile dysfunction 11/01/2017   Paroxysmal atrial fibrillation (HCC) 03/03/2017    Past Medical History:  Diagnosis Date   Atrial fibrillation (HCC)    a. 04/2017 s/p PVI. with ablation 05-06-2017   Atrial flutter (HCC) 2019   with cardioversion   Cataract    left small   GERD (gastroesophageal reflux disease)    past hx   Gout    Pre-diabetes    Sleep apnea    on CPAP    Past Surgical History:  Procedure Laterality Date   ATRIAL FIBRILLATION ABLATION N/A 05/06/2017   Procedure: ATRIAL FIBRILLATION ABLATION;  Surgeon: Inocencio Soyla Lunger, MD;  Location: [facility] INVASIVE CV LAB;  Service: Cardiovascular;  Laterality: N/A;   CARDIOVERSION N/A 03/04/2017   Procedure: CARDIOVERSION;  Surgeon: Ladona Heinz, MD;  Location: [facility] ENDOSCOPY;  Service: Cardiovascular;  Laterality: N/A;   LEFT HEART CATH AND CORONARY ANGIOGRAPHY N/A 05/24/2017   Procedure: LEFT HEART CATH AND CORONARY ANGIOGRAPHY possible angioplasty;  Surgeon: Elmira Newman PARAS, MD;  Location: [facility] INVASIVE CV LAB;  Service: Cardiovascular;  Laterality: N/A;   TOOTH EXTRACTION  05/2021   [place]   WISDOM TOOTH EXTRACTION     all 4    Social History   Socioeconomic History   Marital status: Married    Spouse name: Not on file   Number of children: 2   Years of education: Not on file   Highest education level: Not on file  Occupational History  Not on file  Tobacco Use   Smoking status: Former    Current packs/day: 0.00    Average packs/day: 1 pack/day for 20.0 years (20.0 ttl pk-yrs)    Types: Cigarettes    Start date: 69    Quit date: 1998    Years since quitting: 27.6   Smokeless tobacco: Never  Vaping Use   Vaping status: Never Used  Substance and Sexual Activity   Alcohol use: Yes    Alcohol/week: 3.0 standard drinks of alcohol    Types: 3 Glasses of wine per week    Comment:  occasionally   Drug use: No   Sexual activity: Yes  Other Topics Concern   Not on file  Social History Narrative   ** Merged History Encounter **       Social Drivers of Corporate investment banker Strain: Not on file  Food Insecurity: Not on file  Transportation Needs: Not on file  Physical Activity: Not on file  Stress: Not on file  Social Connections: Not on file  Intimate Partner Violence: Not on file    Family History  Problem Relation Age of Onset   Diabetes Mother    Hyperlipidemia Mother    Diabetes Father    Hyperlipidemia Father    Esophageal cancer Father    Drug abuse Sister    Pancreatic cancer Sister    Hyperlipidemia Maternal Grandmother    Colon polyps Niece    Colon cancer Neg Hx    Prostate cancer Neg Hx    Rectal cancer Neg Hx    Stomach cancer Neg Hx      Review of Systems  Constitutional: Negative.  Negative for chills and fever.  HENT: Negative.  Negative for congestion and sore throat.   Respiratory: Negative.  Negative for cough and shortness of breath.   Cardiovascular:  Positive for palpitations. Negative for chest pain.  Gastrointestinal:  Negative for abdominal pain, diarrhea, nausea and vomiting.  Genitourinary: Negative.  Negative for dysuria and hematuria.  Skin: Negative.  Negative for rash.  Neurological: Negative.  Negative for dizziness and headaches.  All other systems reviewed and are negative.   Vitals:   09/28/23 1415  BP: (!) 126/58  Pulse: 73  Temp: 98.3 F (36.8 C)  SpO2: 95%    Physical Exam Vitals reviewed.  Constitutional:      Appearance: Normal appearance.  HENT:     Head: Normocephalic.     Right Ear: Tympanic membrane, ear canal and external ear normal.     Left Ear: Tympanic membrane, ear canal and external ear normal.     Mouth/Throat:     Mouth: Mucous membranes are moist.     Pharynx: Oropharynx is clear.  Eyes:     Extraocular Movements: Extraocular movements intact.     Conjunctiva/sclera:  Conjunctivae normal.     Pupils: Pupils are equal, round, and reactive to light.  Cardiovascular:     Rate and Rhythm: Normal rate and regular rhythm.     Pulses: Normal pulses.     Heart sounds: Normal heart sounds.  Pulmonary:     Effort: Pulmonary effort is normal.     Breath sounds: Normal breath sounds.  Abdominal:     Palpations: Abdomen is soft.     Tenderness: There is no abdominal tenderness.  Musculoskeletal:     Cervical back: No tenderness.  Lymphadenopathy:     Cervical: No cervical adenopathy.  Skin:    General: Skin is warm and dry.  Capillary Refill: Capillary refill takes less than 2 seconds.  Neurological:     General: No focal deficit present.     Mental Status: He is alert and oriented to person, place, and time.  Psychiatric:        Mood and Affect: Mood normal.        Behavior: Behavior normal.      ASSESSMENT & PLAN: Problem List Items Addressed This Visit       Cardiovascular and Mediastinum   Paroxysmal atrial fibrillation (HCC)   Well-controlled.  Presently in sinus rhythm. On no medication      HTN (hypertension)   BP Readings from Last 3 Encounters:  09/28/23 (!) 126/58  08/22/23 121/63  06/20/23 127/76  Well-controlled hypertension Presently on losartan  25 mg daily       Relevant Orders   Comprehensive metabolic panel with GFR     Respiratory   Obstructive sleep apnea on CPAP   Stable and well-controlled on CPAP treatment        Other   Chronic gout with tophus   No recent gout flareups Uric acid level done today Continue allopurinol  100 mg daily      Relevant Orders   Uric acid   Palpitations   Recent Zio patch report as follows:  HR 46 to 156, average 72. 120 nonsustained SVT episodes, longest 10.6 seconds.  Rhythm strip suggests atrial tachycardia. 1 nonsustained VT episode lasting 15 beats. Occasional supraventricular ectopy, 1.7% Rare ventricular ectopy. No sustained arrhythmias. No atrial  fibrillation. Symptom triggered episodes corresponded to sinus rhythm with supraventricular and ventricular ectopy.   Continues to have recurrent palpitations.  No syncopal episodes Nonsustained SVT episodes Has appointment to see cardiologist next week Will benefit from beta-blocker.        Relevant Orders   Comprehensive metabolic panel with GFR   Other Visit Diagnoses       Encounter for general adult medical examination with abnormal findings    -  Primary   Relevant Orders   Uric acid   CBC with Differential/Platelet   PSA   Comprehensive metabolic panel with GFR   Hemoglobin A1c   Lipid panel   Vitamin B12   VITAMIN D  25 Hydroxy (Vit-D Deficiency, Fractures)     Screening for deficiency anemia       Relevant Orders   CBC with Differential/Platelet     Screening for lipoid disorders       Relevant Orders   Lipid panel     Screening for endocrine, metabolic and immunity disorder       Relevant Orders   Comprehensive metabolic panel with GFR   Hemoglobin A1c   Vitamin B12   VITAMIN D  25 Hydroxy (Vit-D Deficiency, Fractures)     Screening for prostate cancer       Relevant Orders   PSA      Modifiable risk factors discussed with patient. Anticipatory guidance according to age provided. The following topics were also discussed: Social Determinants of Health Smoking.  Non-smoker Diet and nutrition.  May benefit from GLP-1 agonist Benefits of exercise Cancer screening and review of most recent colonoscopy report from 2023 Vaccinations review and recommendations Cardiovascular risk assessment The 10-year ASCVD risk score (Arnett DK, et al., 2019) is: 12.2%   Values used to calculate the score:     Age: 1 years     Clincally relevant sex: Male     Is Non-Hispanic African American: No     Diabetic: No  Tobacco smoker: No     Systolic Blood Pressure: 126 mmHg     Is BP treated: Yes     HDL Cholesterol: 42.1 mg/dL     Total Cholesterol: 154 mg/dL  Mental  health including depression and anxiety Fall and accident prevention  Patient Instructions  Mantenimiento de Research officer, political party, Male Adoptar un estilo de vida saludable y recibir atencin preventiva son importantes para promover la salud y Counsellor. Consulte al mdico sobre: El esquema adecuado para hacerse pruebas y exmenes peridicos. Cosas que puede hacer por su cuenta para prevenir enfermedades y [place] sano. Qu debo saber sobre la dieta, el peso y el ejercicio? Consuma una dieta saludable  Consuma una dieta que incluya muchas verduras, frutas, productos lcteos con bajo contenido de grasa y protenas magras. No consuma muchos alimentos ricos en grasas slidas, azcares agregados o sodio. Mantenga un peso saludable El ndice de masa muscular [facility]) es una medida que puede utilizarse para identificar posibles problemas de [place]. Proporciona una estimacin de la grasa corporal basndose en el peso y la altura. Su mdico puede ayudarle a determinar su IMC y a Personnel officer o Pharmacologist un peso saludable. Haga ejercicio con regularidad Haga ejercicio con regularidad. Esta es una de las prcticas ms importantes que puede hacer por su salud. La [employer] de los adultos deben seguir estas pautas: Education officer, environmental, al menos, 150 minutos de actividad fsica por semana. El ejercicio debe aumentar la frecuencia cardaca y Media planner transpirar (ejercicio de intensidad moderada). Hacer ejercicios de fortalecimiento por lo [employer] por semana. Agregue esto a su plan de ejercicio de intensidad moderada. Pase menos tiempo sentado. Incluso la actividad fsica ligera puede ser beneficiosa. Controle sus niveles de colesterol y lpidos en la sangre Comience a realizarse anlisis de lpidos y Oncologist en la sangre a los 20 aos y luego reptalos cada 5 aos. Es posible que Insurance underwriter los niveles de colesterol con mayor frecuencia si: Sus niveles de lpidos y colesterol son  altos. Es mayor de 40 aos. Presenta un alto riesgo de padecer enfermedades cardacas. Qu debo saber sobre las pruebas de deteccin del cncer? Muchos tipos de cncer pueden detectarse de manera temprana y, a menudo, pueden prevenirse. Segn su historia clnica y sus antecedentes familiares, es posible que deba realizarse pruebas de deteccin del cncer en diferentes edades. Esto puede incluir pruebas de deteccin de lo siguiente: Building services engineer. Cncer de prstata. Cncer de piel. Cncer de pulmn. Qu debo saber sobre la enfermedad cardaca, la diabetes y la hipertensin arterial? Presin arterial y enfermedad cardaca La hipertensin arterial causa enfermedades cardacas y lesotho el riesgo de accidente cerebrovascular. Es ms probable que esto se manifieste en las personas que tienen lecturas de presin arterial alta o tienen sobrepeso. Hable con el mdico sobre sus valores de presin arterial deseados. Hgase controlar la presin arterial: Cada 3 a 5 aos si tiene entre 18 y 43 aos. Todos los aos si es mayor de 40 aos. Si tiene entre 65 y 70 aos y es fumador o Insurance underwriter, pregntele al mdico si debe realizarse una prueba de deteccin de aneurisma artico abdominal (AAA) por nica vez. Diabetes Realcese exmenes de deteccin de la diabetes con regularidad. Este anlisis revisa el nivel de azcar en la sangre en [place]. Hgase las pruebas de deteccin: Cada tres aos despus de los 45 aos de edad si tiene un peso normal y un bajo riesgo de padecer diabetes. Con ms frecuencia y a Glass blower/designer de [place]  edad inferior si tiene sobrepeso o un alto riesgo de padecer diabetes. Qu debo saber sobre la prevencin de infecciones? Hepatitis B Si tiene un riesgo ms alto de contraer hepatitis B, debe someterse a un examen de deteccin de este virus. Hable con el mdico para averiguar si tiene riesgo de contraer la infeccin por hepatitis B. Hepatitis C Se recomienda un anlisis de [place]  para: Todos los que nacieron entre 1946 y [phone]. Todas las personas que tengan un riesgo de haber contrado hepatitis C. Enfermedades de transmisin sexual (ETS) Debe realizarse pruebas de deteccin de ITS todos los aos, incluidas la gonorrea y la clamidia, si: Es sexualmente activo y es menor de [address]. Es mayor de [address], y Public affairs consultant informa que corre riesgo de tener este tipo de infecciones. La actividad sexual ha cambiado desde que le hicieron la ltima prueba de deteccin y tiene un riesgo mayor de tener clamidia o Copy. Pregntele al mdico si usted tiene riesgo. Pregntele al mdico si usted tiene un alto riesgo de Primary school teacher VIH. El mdico tambin puede recomendarle un medicamento recetado para ayudar a evitar la infeccin por el VIH. Si elige tomar medicamentos para prevenir el VIH, primero debe [employer] de deteccin del VIH. Luego debe hacerse anlisis cada 3 meses mientras est tomando los medicamentos. Siga estas indicaciones en su casa: Consumo de alcohol No beba alcohol si el mdico se lo prohbe. Si bebe alcohol: Limite la cantidad que consume de 0 a 2 bebidas por da. Sepa cunta cantidad de alcohol hay en las bebidas que toma. En los [address], una medida equivale a una botella de cerveza de 12 oz (355 ml), un vaso de vino de 5 oz (148 ml) o un vaso de una bebida alcohlica de alta graduacin de 1 oz (44 ml). Estilo de vida No consuma ningn producto que contenga nicotina o tabaco. Estos productos incluyen cigarrillos, tabaco para Theatre manager y aparatos de vapeo, como los cigarrillos electrnicos. Si necesita ayuda para dejar de consumir estos productos, consulte al mdico. No consuma drogas. No comparta agujas. Solicite ayuda a su mdico si necesita apoyo o informacin para abandonar las drogas. Indicaciones generales Realcese los estudios de rutina de [address], dentales y de Wellsite geologist. Mantngase al da con las vacunas. Infrmele a su mdico si: Se  siente deprimido con frecuencia. Alguna vez ha sido vctima de maltrato o no se siente seguro en su casa. Resumen Adoptar un estilo de vida saludable y recibir atencin preventiva son importantes para promover la salud y Counsellor. Siga las instrucciones del mdico acerca de una dieta saludable, el ejercicio y la realizacin de pruebas o exmenes para Hotel manager. Siga las instrucciones del mdico con respecto al control del colesterol y la presin arterial. Esta informacin no tiene Theme park manager el consejo del mdico. Asegrese de hacerle al mdico cualquier pregunta que tenga. Document Revised: 06/25/2020 Document Reviewed: 06/25/2020 Elsevier Patient Education  2024 Elsevier Inc.     Emil Schaumann, MD [facility]

## 2023-09-28 NOTE — Assessment & Plan Note (Signed)
Stable and well-controlled on CPAP treatment.

## 2023-09-28 NOTE — Assessment & Plan Note (Signed)
 No recent gout flareups Uric acid level done today Continue allopurinol  100 mg daily

## 2023-09-28 NOTE — Assessment & Plan Note (Signed)
Well-controlled.  Presently in sinus rhythm. On no medication.

## 2023-09-28 NOTE — Assessment & Plan Note (Addendum)
 BP Readings from Last 3 Encounters:  09/28/23 (!) 126/58  08/22/23 121/63  06/20/23 127/76  Well-controlled hypertension Presently on losartan  25 mg daily

## 2023-09-28 NOTE — Assessment & Plan Note (Signed)
 Recent Zio patch report as follows:  HR 46 to 156, average 72. 120 nonsustained SVT episodes, longest 10.6 seconds.  Rhythm strip suggests atrial tachycardia. 1 nonsustained VT episode lasting 15 beats. Occasional supraventricular ectopy, 1.7% Rare ventricular ectopy. No sustained arrhythmias. No atrial fibrillation. Symptom triggered episodes corresponded to sinus rhythm with supraventricular and ventricular ectopy.   Continues to have recurrent palpitations.  No syncopal episodes Nonsustained SVT episodes Has appointment to see cardiologist next week Will benefit from beta-blocker.

## 2023-09-29 ENCOUNTER — Ambulatory Visit: Payer: Self-pay | Admitting: Emergency Medicine

## 2023-09-29 ENCOUNTER — Other Ambulatory Visit: Payer: Self-pay | Admitting: Emergency Medicine

## 2023-09-29 DIAGNOSIS — I152 Hypertension secondary to endocrine disorders: Secondary | ICD-10-CM

## 2023-09-29 DIAGNOSIS — E559 Vitamin D deficiency, unspecified: Secondary | ICD-10-CM | POA: Insufficient documentation

## 2023-09-29 DIAGNOSIS — E1169 Type 2 diabetes mellitus with other specified complication: Secondary | ICD-10-CM | POA: Insufficient documentation

## 2023-09-29 MED ORDER — VITAMIN D (ERGOCALCIFEROL) 1.25 MG (50000 UNIT) PO CAPS: 50000.0000 [IU] | ORAL_CAPSULE | ORAL | 1 refills | Status: DC

## 2023-09-29 MED ORDER — EMPAGLIFLOZIN 10 MG PO TABS: 10.0000 mg | ORAL_TABLET | Freq: Every day | ORAL | 3 refills | Status: AC

## 2023-09-29 MED ORDER — ROSUVASTATIN CALCIUM 10 MG PO TABS: 10.0000 mg | ORAL_TABLET | Freq: Every day | ORAL | 3 refills | Status: AC

## 2023-09-29 MED ORDER — TIRZEPATIDE 2.5 MG/0.5ML ~~LOC~~ SOAJ: 2.5000 mg | SUBCUTANEOUS | 3 refills | Status: DC

## 2023-10-05 ENCOUNTER — Other Ambulatory Visit: Payer: Self-pay | Admitting: Emergency Medicine

## 2023-10-05 DIAGNOSIS — Z8739 Personal history of other diseases of the musculoskeletal system and connective tissue: Secondary | ICD-10-CM

## 2023-10-05 DIAGNOSIS — E790 Hyperuricemia without signs of inflammatory arthritis and tophaceous disease: Secondary | ICD-10-CM

## 2023-10-06 ENCOUNTER — Ambulatory Visit (HOSPITAL_BASED_OUTPATIENT_CLINIC_OR_DEPARTMENT_OTHER): Admitting: Cardiology

## 2023-10-06 ENCOUNTER — Encounter (HOSPITAL_BASED_OUTPATIENT_CLINIC_OR_DEPARTMENT_OTHER): Payer: Self-pay | Admitting: Cardiology

## 2023-10-06 VITALS — BP 124/78 | HR 59 | Ht 67.0 in | Wt 237.0 lb

## 2023-10-06 DIAGNOSIS — I480 Paroxysmal atrial fibrillation: Secondary | ICD-10-CM | POA: Diagnosis not present

## 2023-10-06 DIAGNOSIS — R002 Palpitations: Secondary | ICD-10-CM | POA: Diagnosis not present

## 2023-10-06 DIAGNOSIS — I471 Supraventricular tachycardia, unspecified: Secondary | ICD-10-CM | POA: Diagnosis not present

## 2023-10-06 DIAGNOSIS — R079 Chest pain, unspecified: Secondary | ICD-10-CM | POA: Diagnosis not present

## 2023-10-06 MED ORDER — METOPROLOL SUCCINATE ER 25 MG PO TB24
25.0000 mg | ORAL_TABLET | Freq: Every day | ORAL | 3 refills | Status: AC
Start: 2023-10-06 — End: ?

## 2023-10-06 NOTE — Progress Notes (Signed)
 Cardiology Office Note:  .   Date:  10/06/2023  ID:  [facility], DOB Jan 31, 1961, MRN [id number] PCP: Purcell Emil Schanz, MD  [facility] Providers Cardiologist:  None Electrophysiologist:  Will Gladis Norton, MD    History of Present Illness: .   [facility] Goettel is a 63 y.o. male Discussed the use of AI scribe software for clinical note transcription with the patient, who gave verbal consent to proceed.  History of Present Illness Pratik Dalziel is a 63 year old male with chronic kidney disease stage 3A and atrial fibrillation who presents with palpitations and dizziness.  He experiences dizziness, particularly during episodes of heart palpitations. These episodes are characterized by rapid heartbeats followed by a return to normal rhythm and occur frequently, causing dizziness. No chest pain or shortness of breath is reported during these episodes, but he does report occasional dizziness and 'a little bit' of fainting.  He has a history of atrial fibrillation and underwent an ablation in 2019 Dr. Norton. Normal coronary angiogram in April 2019 showed left dominant normal coronary arteries. An echocardiogram in 2019 showed an ejection fraction of 68%, and a nuclear stress test in 2019 was normal.  He was previously on flecainide  for atrial fibrillation, but it was discontinued. He mentions that he used to take a medication to prevent his heart from beating too fast, but it was ineffective when he stood up or walked.  He recently used a monitor provided by his primary care physician, which showed no atrial fibrillation in April 2025, but did record episodes of rapid heartbeats followed by a return to normal rhythm, paroxysmal atrial tachycardia.  He maintains a daily walking routine, achieving over 10,000 steps per day, and consumes approximately 75-78 ounces of water daily.  Works in housekeeping at [employer]    Studies  Reviewed: SABRA   EKG Interpretation Date/Time:  Thursday October 06 2023 15:29:30 EDT Ventricular Rate:  59 PR Interval:  140 QRS Duration:  86 QT Interval:  422 QTC Calculation: 417 R Axis:   -38  Text Interpretation: Sinus bradycardia Left axis deviation Increased R/S ratio in V1, consider early transition or posterior infarct When compared with ECG of 09-Jun-2023 07:24, No significant change since last tracing Confirmed by Jeffrie Anes ([id number]) on 10/06/2023 3:39:36 PM    Results RADIOLOGY Coronary angiogram: Left dominant normal coronary arteries (05/24/2017)  DIAGNOSTIC Echocardiogram: Ejection Fraction (EF) 68% (2019) Nuclear stress test: Normal (2019) Electrocardiogram (EKG): Normal (10/06/2023) Risk Assessment/Calculations:            Physical Exam:   VS:  BP 124/78   Pulse (!) 59   Ht 5' 7 (1.702 m)   Wt 237 lb (107.5 kg)   SpO2 95%   BMI 37.12 kg/m    Wt Readings from Last 3 Encounters:  10/06/23 237 lb (107.5 kg)  09/28/23 242 lb (109.8 kg)  06/09/23 233 lb (105.7 kg)    GEN: Well nourished, well developed in no acute distress NECK: No JVD; No carotid bruits CARDIAC: RRR, no murmurs, no rubs, no gallops RESPIRATORY:  Clear to auscultation without rales, wheezing or rhonchi  ABDOMEN: Soft, non-tender, non-distended EXTREMITIES:  No edema; No deformity   ASSESSMENT AND PLAN: .    Assessment and Plan Assessment & Plan Palpitations Episodes of palpitations characterized by rapid heartbeat followed by normal rhythm, associated with dizziness but not chest pain, shortness of breath, or fainting. Recent monitoring ruled out atrial fibrillation. Likely due to premature beats or  benign arrhythmia. Metoprolol  proposed to reduce frequency and intensity of palpitations. Risks include fatigue and dizziness, but no expected harm. Decision based on potential to stabilize heart rhythm and improve symptoms. - Initiate low-dose metoprolol  succinate 25 mg daily to manage  palpitations and assess effectiveness - Advise maintaining good hydration  Atrial Fibrillation parox Atrial fibrillation treated with ablation in 2019. Recent monitoring showed no recurrence, indicating effective ablation.  Chronic Kidney Disease Stage 3A Chronic kidney disease stage 3A. - avoid NSAIDS  Follow-up Follow-up to assess medication effectiveness and monitor condition. - Schedule follow-up appointment in six months with an Advanced Practice Provider (APP)          Signed, Oneil Parchment, MD

## 2023-10-06 NOTE — Patient Instructions (Signed)
 Medication Instructions:  Please start Metoprolol  Succinate 25 mg a day. Continue all other medications as listed.  *If you need a refill on your cardiac medications before your next appointment, please call your pharmacy*  Follow-Up: At [facility], you and your health needs are our priority.  As part of our continuing mission to provide you with exceptional heart care, our providers are all part of one team.  This team includes your primary Cardiologist (physician) and Advanced Practice Providers or APPs (Physician Assistants and Nurse Practitioners) who all work together to provide you with the care you need, when you need it.  Your next appointment:   6 month(s)  Provider:   One of our Advanced Practice Providers (APPs): Morse Clause, PA-C  Lamarr Satterfield, NP Miriam Shams, NP  Olivia Pavy, PA-C Josefa Beauvais, NP  Leontine Salen, PA-C Orren Fabry, PA-C  [place], PA-C Ernest Dick, NP  Damien Braver, NP Jon Hails, PA-C  Waddell Donath, PA-C    Dayna Dunn, PA-C  Scott Weaver, PA-C Lum Louis, NP Katlyn West, NP Callie Goodrich, PA-C  Evan Williams, PA-C Sheng Haley, PA-C  Xika Zhao, NP Kathleen Johnson, PA-C       We recommend signing up for the patient portal called MyChart.  Sign up information is provided on this After Visit Summary.  MyChart is used to connect with patients for Virtual Visits (Telemedicine).  Patients are able to view lab/test results, encounter notes, upcoming appointments, etc.  Non-urgent messages can be sent to your provider as well.   To learn more about what you can do with MyChart, go to [email or web address].

## 2023-10-24 ENCOUNTER — Ambulatory Visit: Admitting: Dermatology

## 2023-11-09 ENCOUNTER — Other Ambulatory Visit: Payer: Self-pay | Admitting: Emergency Medicine

## 2023-11-24 ENCOUNTER — Ambulatory Visit: Payer: Self-pay | Admitting: Family Medicine

## 2023-11-24 ENCOUNTER — Ambulatory Visit: Payer: Self-pay

## 2023-11-24 ENCOUNTER — Ambulatory Visit: Admitting: Family Medicine

## 2023-11-24 ENCOUNTER — Ambulatory Visit (INDEPENDENT_AMBULATORY_CARE_PROVIDER_SITE_OTHER)

## 2023-11-24 ENCOUNTER — Encounter: Payer: Self-pay | Admitting: Family Medicine

## 2023-11-24 VITALS — BP 114/62 | HR 56 | Temp 97.8°F | Ht 67.0 in | Wt 229.0 lb

## 2023-11-24 DIAGNOSIS — R1031 Right lower quadrant pain: Secondary | ICD-10-CM | POA: Diagnosis not present

## 2023-11-24 DIAGNOSIS — M5441 Lumbago with sciatica, right side: Secondary | ICD-10-CM | POA: Diagnosis not present

## 2023-11-24 DIAGNOSIS — N200 Calculus of kidney: Secondary | ICD-10-CM | POA: Diagnosis not present

## 2023-11-24 LAB — POC URINALSYSI DIPSTICK (AUTOMATED)
Bilirubin, UA: NEGATIVE
Blood, UA: NEGATIVE
Glucose, UA: POSITIVE — AB
Ketones, UA: NEGATIVE
Leukocytes, UA: NEGATIVE
Nitrite, UA: NEGATIVE
Protein, UA: NEGATIVE
Spec Grav, UA: 1.015 (ref 1.010–1.025)
Urobilinogen, UA: 0.2 U/dL
pH, UA: 6 (ref 5.0–8.0)

## 2023-11-24 MED ORDER — MELOXICAM 7.5 MG PO TABS: 7.5000 mg | ORAL_TABLET | Freq: Every day | ORAL | 0 refills | Status: AC

## 2023-11-24 NOTE — Progress Notes (Signed)
 Subjective:     Patient ID: Evan Page, male    DOB: 1960/08/08, 63 y.o.   MRN: [id number]  Chief Complaint  Patient presents with   Back Pain    Pain on right side on back, started last week and today it is getting worse. Very severe, on and off not constant. Urine strong smell    HPI  Discussed the use of AI scribe software for clinical note transcription with the patient, who gave verbal consent to proceed.  History of Present Illness Berish Bohman is a 63 year old male with chronic kidney disease who presents with back pain.  Back pain - Back pain present for the past week - Pain primarily located in the back with occasional radiation to the groin and RLQ - Pain is intermittent, occurring every 20 minutes to once an hour - Pain episodes last up to 30 minutes - Pain is exacerbated by movement - No specific alleviating factors - No recent injury or new activities - Pain does not disturb sleep - Pain has not been severe enough to require medication and no pain medication taken  Urinary symptoms - Increased urination - No history of kidney stones - No hematuria - no urinary stream changes or urgency   Constitutional and gastrointestinal symptoms - No fever or chills - No nausea or vomiting - No changes in bowel habits  Diabetes management - Taking Jardiance  for diabetes     Health Maintenance Due  Topic Date Due   FOOT EXAM  Never done   OPHTHALMOLOGY EXAM  Never done   Diabetic kidney evaluation - Urine ACR  Never done   Pneumococcal Vaccine: 50+ Years (1 of 2 - PCV) Never done    Past Medical History:  Diagnosis Date   Atrial fibrillation (HCC)    a. 04/2017 s/p PVI. with ablation 05-06-2017   Atrial flutter (HCC) 2019   with cardioversion   Cataract    left small   GERD (gastroesophageal reflux disease)    past hx   Gout    Pre-diabetes    Sleep apnea    on CPAP    Past Surgical History:  Procedure Laterality  Date   ATRIAL FIBRILLATION ABLATION N/A 05/06/2017   Procedure: ATRIAL FIBRILLATION ABLATION;  Surgeon: Inocencio Soyla Lunger, MD;  Location: [facility];  Service: Cardiovascular;  Laterality: N/A;   CARDIOVERSION N/A 03/04/2017   Procedure: CARDIOVERSION;  Surgeon: Ladona Heinz, MD;  Location: [facility];  Service: Cardiovascular;  Laterality: N/A;   LEFT HEART CATH AND CORONARY ANGIOGRAPHY N/A 05/24/2017   Procedure: LEFT HEART CATH AND CORONARY ANGIOGRAPHY possible angioplasty;  Surgeon: Elmira Newman PARAS, MD;  Location: [facility];  Service: Cardiovascular;  Laterality: N/A;   TOOTH EXTRACTION  05/2021   [facility]   WISDOM TOOTH EXTRACTION     all 4    Family History  Problem Relation Age of Onset   Diabetes Mother    Hyperlipidemia Mother    Diabetes Father    Hyperlipidemia Father    Esophageal cancer Father    Drug abuse Sister    Pancreatic cancer Sister    Hyperlipidemia Maternal Grandmother    Colon polyps Niece    Colon cancer Neg Hx    Prostate cancer Neg Hx    Rectal cancer Neg Hx    Stomach cancer Neg Hx     Social History   Socioeconomic History   Marital status: Married    Spouse name: Not  on file   Number of children: 2   Years of education: Not on file   Highest education level: Not on file  Occupational History   Not on file  Tobacco Use   Smoking status: Former    Current packs/day: 0.00    Average packs/day: 1 pack/day for 20.0 years (20.0 ttl pk-yrs)    Types: Cigarettes    Start date: 61    Quit date: 21    Years since quitting: 27.8   Smokeless tobacco: Never  Vaping Use   Vaping status: Never Used  Substance and Sexual Activity   Alcohol use: Yes    Alcohol/week: 3.0 standard drinks of alcohol    Types: 3 Glasses of wine per week    Comment: occasionally   Drug use: No   Sexual activity: Yes  Other Topics Concern   Not on file  Social History Narrative   ** Merged History Encounter **       Social Drivers of  Corporate investment banker Strain: Not on file  Food Insecurity: Not on file  Transportation Needs: Not on file  Physical Activity: Not on file  Stress: Not on file  Social Connections: Not on file  Intimate Partner Violence: Not on file    Outpatient Medications Prior to Visit  Medication Sig Dispense Refill   allopurinol  (ZYLOPRIM ) 100 MG tablet TAKE 1 TABLET BY MOUTH EVERY DAY 90 tablet 3   empagliflozin  (JARDIANCE ) 10 MG TABS tablet Take 1 tablet (10 mg total) by mouth daily before breakfast. 90 tablet 3   furosemide  (LASIX ) 20 MG tablet TAKE 1 TABLET BY MOUTH EVERY DAY 90 tablet 0   losartan  (COZAAR ) 25 MG tablet TAKE 1 TABLET (25 MG TOTAL) BY MOUTH DAILY. 90 tablet 3   metoprolol  succinate (TOPROL -XL) 25 MG 24 hr tablet Take 1 tablet (25 mg total) by mouth daily. 90 tablet 3   rosuvastatin  (CRESTOR ) 10 MG tablet Take 1 tablet (10 mg total) by mouth daily. 90 tablet 3   sildenafil  (VIAGRA ) 50 MG tablet Take 25 mg by mouth daily as needed for erectile dysfunction.     tirzepatide  (MOUNJARO ) 2.5 MG/0.5ML Pen Inject 2.5 mg into the skin once a week. Recommend to increase dose to 5 mg weekly after 3 to 4 weeks if side effects tolerated 2 mL 3   Vitamin D , Ergocalciferol , (DRISDOL ) 1.25 MG (50000 UNIT) CAPS capsule Take 1 capsule (50,000 Units total) by mouth every 7 (seven) days. 7 capsule 1   No facility-administered medications prior to visit.    No Known Allergies  Review of Systems  Constitutional:  Negative for chills and fever.  Respiratory:  Negative for shortness of breath.   Cardiovascular:  Negative for chest pain, palpitations and leg swelling.  Gastrointestinal:  Positive for abdominal pain. Negative for blood in stool, constipation, diarrhea, nausea and vomiting.  Genitourinary:  Positive for flank pain. Negative for dysuria, frequency, hematuria and urgency.  Musculoskeletal:  Positive for back pain.  Skin:  Negative for rash.  Neurological:  Negative for  dizziness, focal weakness and headaches.       Objective:    Physical Exam Constitutional:      General: He is not in acute distress.    Appearance: He is not ill-appearing.  HENT:     Mouth/Throat:     Mouth: Mucous membranes are moist.     Pharynx: Oropharynx is clear.  Eyes:     Extraocular Movements: Extraocular movements intact.  Conjunctiva/sclera: Conjunctivae normal.     Pupils: Pupils are equal, round, and reactive to light.  Cardiovascular:     Rate and Rhythm: Normal rate and regular rhythm.  Pulmonary:     Effort: Pulmonary effort is normal.     Breath sounds: Normal breath sounds.  Abdominal:     General: Abdomen is flat.     Palpations: Abdomen is soft.     Tenderness: There is right CVA tenderness. There is no left CVA tenderness, guarding or rebound.  Musculoskeletal:     Cervical back: Normal range of motion and neck supple. No tenderness.     Right lower leg: No edema.     Left lower leg: No edema.  Lymphadenopathy:     Cervical: No cervical adenopathy.  Skin:    General: Skin is warm and dry.  Neurological:     General: No focal deficit present.     Mental Status: He is alert and oriented to person, place, and time.     Cranial Nerves: No cranial nerve deficit.     Motor: No weakness.     Coordination: Coordination normal.     Gait: Gait normal.  Psychiatric:        Mood and Affect: Mood normal.        Behavior: Behavior normal.        Thought Content: Thought content normal.      BP 114/62   Pulse (!) 56   Temp 97.8 F (36.6 C) (Temporal)   Ht 5' 7 (1.702 m)   Wt 229 lb (103.9 kg)   SpO2 95%   BMI 35.87 kg/m  Wt Readings from Last 3 Encounters:  11/24/23 229 lb (103.9 kg)  10/06/23 237 lb (107.5 kg)  09/28/23 242 lb (109.8 kg)       Assessment & Plan:   Problem List Items Addressed This Visit   None Visit Diagnoses       Acute right-sided low back pain with right-sided sciatica    -  Primary   Relevant Medications    meloxicam (MOBIC) 7.5 MG tablet   Other Relevant Orders   POCT Urinalysis Dipstick (Automated) (Completed)   DG Lumbar Spine 2-3 Views (Completed)   CT RENAL STONE STUDY     Right renal stone       Relevant Orders   CT RENAL STONE STUDY     Right lower quadrant abdominal pain       Relevant Orders   CT RENAL STONE STUDY       Assessment and Plan Assessment & Plan Chronic kidney disease Chronic kidney disease is present. Hematuria is absent, reducing the likelihood of kidney stones as a cause of pain.  Type 2 diabetes mellitus Type 2 diabetes mellitus is managed with Jardiance , which is causing increased urination. No acute issues are present. - glucose seen on UA   Right low back pain Intermittent right low back pain for one week, sometimes radiating to the RLQ.  No recent injury or new activities reported. Pain worsens with movement and pressure. Differential diagnosis includes musculoskeletal strain and kidney stones. No fever, chills, nausea, vomiting, changes in bowel habits, or rash, reducing the likelihood of shingles. Tenderness in the right lumbar spine area. Pain does not disturb sleep. - Urinalysis dipstick shows glucose, negative otherwise.  - Order x-ray of lumbar spine. - Prescribe meloxicam with food for 1-2 weeks. - Advise use of heating pad or ice pack as needed for pain relief. - Instruct to report  new symptoms such as fever, chills, severe or constant pain, hematuria, or urinary issues.  Addendum:  STAT CT Renal stone study ordered   X ray findings  IMPRESSION: 1. No acute lumbar spine fracture. 2. Multilevel spondylosis and facet hypertrophy as above, not significantly changed since prior exam. 3. Possible 7 mm right renal calculus. If further evaluation is desired, unenhanced CT of the abdomen and pelvis could be considered.   Electronically Signed   By: Ozell Daring M.D.   On: 11/24/2023 16:25   I am having Clark A. [name] start on  meloxicam. I am also having him maintain his losartan , tirzepatide , empagliflozin , rosuvastatin , Vitamin D  (Ergocalciferol ), allopurinol , sildenafil , metoprolol  succinate, and furosemide .  Meds ordered this encounter  Medications   meloxicam (MOBIC) 7.5 MG tablet    Sig: Take 1 tablet (7.5 mg total) by mouth daily.    Dispense:  14 tablet    Refill:  0    Supervising Provider:   ROLLENE NORRIS A [[id number]]

## 2023-11-24 NOTE — Patient Instructions (Signed)
 Use heat or ice and a topical pain medication or patch on the area.   Try the meloxicam once daily with food for the next 1-2 weeks.   If you have any new or worsening symptoms, let us  know.

## 2023-11-24 NOTE — Progress Notes (Signed)
 Please let him know that his X ray shows a possible kidney stone in the area of his pain. I will order a CT scan to look closer at this.

## 2023-11-24 NOTE — Telephone Encounter (Signed)
 FYI Only or Action Required?: FYI only for provider.  Patient was last seen in primary care on 09/28/2023 by Purcell Emil Schanz, MD.  Called Nurse Triage reporting Back Pain. - Also had feet swelling which has resolved.  Symptoms began a week ago.  Interventions attempted: Nothing.  Symptoms are: gradually worsening.  Triage Disposition: See PCP When Office is Open (Within 3 Days)  Patient/caregiver understands and will follow disposition?: yes - appt for this afternoon at [facility]                       Copied from CRM [id number]. Topic: Clinical - Red Word Triage >> Nov 24, 2023  7:58 AM Harlene ORN wrote: Red Word that prompted transfer to Nurse Triage: pain in his back 8 out of 10 pain; water retention in his feet (swelling?) Reason for Disposition  [1] MODERATE back pain (e.g., interferes with normal activities) AND [2] present > 3 days  Answer Assessment - Initial Assessment Questions 1. ONSET: When did the pain begin? (e.g., minutes, hours, days)     Last week 2. LOCATION: Where does it hurt? (upper, mid or lower back)     Lower back - right side , and middle 3. SEVERITY: How bad is the pain?  (e.g., Scale 1-10; mild, moderate, or severe)     8/10 4. PATTERN: Is the pain constant? (e.g., yes, no; constant, intermittent)      constant 5. RADIATION: Does the pain shoot into your legs or somewhere else?     Some times to his front and other times down left leg 6. CAUSE:  What do you think is causing the back pain?      Kidneys 7. BACK OVERUSE:  Any recent lifting of heavy objects, strenuous work or exercise?     no 8. MEDICINES: What have you taken so far for the pain? (e.g., nothing, acetaminophen , NSAIDS)     no  10. OTHER SYMPTOMS: Do you have any other symptoms? (e.g., fever, abdomen pain, burning with urination, blood in urine)       Feet swollen -  2-3 days  Protocols used: Back Pain-A-AH

## 2023-11-25 ENCOUNTER — Ambulatory Visit
Admission: RE | Admit: 2023-11-25 | Discharge: 2023-11-25 | Disposition: A | Discharge status: Home / Self Care | Source: Ambulatory Visit | Attending: Family Medicine | Admitting: Family Medicine

## 2023-11-25 DIAGNOSIS — M5441 Lumbago with sciatica, right side: Secondary | ICD-10-CM | POA: Insufficient documentation

## 2023-11-25 DIAGNOSIS — R1031 Right lower quadrant pain: Secondary | ICD-10-CM | POA: Insufficient documentation

## 2023-11-25 DIAGNOSIS — N200 Calculus of kidney: Secondary | ICD-10-CM | POA: Insufficient documentation

## 2023-12-31 ENCOUNTER — Other Ambulatory Visit: Payer: Self-pay | Admitting: Emergency Medicine

## 2023-12-31 DIAGNOSIS — E559 Vitamin D deficiency, unspecified: Secondary | ICD-10-CM

## 2024-02-03 ENCOUNTER — Other Ambulatory Visit: Payer: Self-pay | Admitting: Emergency Medicine

## 2024-03-01 ENCOUNTER — Other Ambulatory Visit: Payer: Self-pay | Admitting: Emergency Medicine

## 2024-03-01 DIAGNOSIS — I152 Hypertension secondary to endocrine disorders: Secondary | ICD-10-CM

## 2024-03-01 DIAGNOSIS — E1169 Type 2 diabetes mellitus with other specified complication: Secondary | ICD-10-CM

## 2024-04-02 ENCOUNTER — Ambulatory Visit: Admitting: Emergency Medicine

## 2024-05-28 ENCOUNTER — Ambulatory Visit: Admitting: Family Medicine
